# Patient Record
Sex: Female | Born: 1940
Health system: Southern US, Community
[De-identification: ages and names within clinical notes are randomized; demographics above are authoritative.]

## PROBLEM LIST (undated history)

## (undated) DIAGNOSIS — Z973 Presence of spectacles and contact lenses: Secondary | ICD-10-CM

## (undated) DIAGNOSIS — Z860100 Personal history of colon polyps, unspecified: Secondary | ICD-10-CM

## (undated) DIAGNOSIS — Z9889 Other specified postprocedural states: Secondary | ICD-10-CM

## (undated) DIAGNOSIS — Z86718 Personal history of other venous thrombosis and embolism: Secondary | ICD-10-CM

## (undated) DIAGNOSIS — Z85828 Personal history of other malignant neoplasm of skin: Secondary | ICD-10-CM

## (undated) DIAGNOSIS — B1910 Unspecified viral hepatitis B without hepatic coma: Secondary | ICD-10-CM

## (undated) DIAGNOSIS — Q245 Malformation of coronary vessels: Secondary | ICD-10-CM

## (undated) DIAGNOSIS — M419 Scoliosis, unspecified: Secondary | ICD-10-CM

## (undated) DIAGNOSIS — N183 Chronic kidney disease, stage 3 unspecified: Secondary | ICD-10-CM

## (undated) DIAGNOSIS — N393 Stress incontinence (female) (male): Secondary | ICD-10-CM

## (undated) DIAGNOSIS — I10 Essential (primary) hypertension: Secondary | ICD-10-CM

## (undated) DIAGNOSIS — C50212 Malignant neoplasm of upper-inner quadrant of left female breast: Secondary | ICD-10-CM

## (undated) DIAGNOSIS — G4700 Insomnia, unspecified: Secondary | ICD-10-CM

## (undated) DIAGNOSIS — K5790 Diverticulosis of intestine, part unspecified, without perforation or abscess without bleeding: Secondary | ICD-10-CM

## (undated) DIAGNOSIS — Z923 Personal history of irradiation: Secondary | ICD-10-CM

## (undated) DIAGNOSIS — C3490 Malignant neoplasm of unspecified part of unspecified bronchus or lung: Secondary | ICD-10-CM

## (undated) DIAGNOSIS — M1990 Unspecified osteoarthritis, unspecified site: Secondary | ICD-10-CM

## (undated) DIAGNOSIS — E785 Hyperlipidemia, unspecified: Secondary | ICD-10-CM

## (undated) DIAGNOSIS — M8580 Other specified disorders of bone density and structure, unspecified site: Secondary | ICD-10-CM

## (undated) DIAGNOSIS — Z859 Personal history of malignant neoplasm, unspecified: Secondary | ICD-10-CM

## (undated) DIAGNOSIS — F419 Anxiety disorder, unspecified: Secondary | ICD-10-CM

## (undated) DIAGNOSIS — Z170 Estrogen receptor positive status [ER+]: Secondary | ICD-10-CM

## (undated) DIAGNOSIS — R112 Nausea with vomiting, unspecified: Secondary | ICD-10-CM

## (undated) DIAGNOSIS — I493 Ventricular premature depolarization: Secondary | ICD-10-CM

## (undated) HISTORY — PX: BASAL CELL CARCINOMA EXCISION: SHX1214

## (undated) HISTORY — PX: VIDEO ASSISTED THORACOSCOPY (VATS)/WEDGE RESECTION: SHX6174

## (undated) HISTORY — PX: THYMECTOMY: SHX1063

## (undated) HISTORY — DX: Essential (primary) hypertension: I10

## (undated) HISTORY — PX: COLONOSCOPY: SHX174

## (undated) HISTORY — PX: BREAST EXCISIONAL BIOPSY: SUR124

## (undated) HISTORY — PX: BREAST BIOPSY: SHX20

## (undated) HISTORY — PX: CHOLECYSTECTOMY OPEN: SUR202

## (undated) HISTORY — PX: TONSILLECTOMY AND ADENOIDECTOMY: SUR1326

## (undated) HISTORY — DX: Anxiety disorder, unspecified: F41.9

## (undated) HISTORY — PX: DILATION AND CURETTAGE OF UTERUS: SHX78

## (undated) HISTORY — PX: CARDIAC CATHETERIZATION: SHX172

## (undated) HISTORY — PX: SQUAMOUS CELL CARCINOMA EXCISION: SHX2433

## (undated) HISTORY — DX: Other specified disorders of bone density and structure, unspecified site: M85.80

---

## 1968-12-21 DIAGNOSIS — B1910 Unspecified viral hepatitis B without hepatic coma: Secondary | ICD-10-CM

## 1968-12-21 HISTORY — DX: Unspecified viral hepatitis B without hepatic coma: B19.10

## 1972-04-22 HISTORY — PX: VAGINAL HYSTERECTOMY: SUR661

## 1998-03-03 ENCOUNTER — Other Ambulatory Visit: Admission: RE | Admit: 1998-03-03 | Discharge: 1998-03-03 | Payer: Self-pay | Admitting: *Deleted

## 1999-07-24 ENCOUNTER — Encounter: Admission: RE | Admit: 1999-07-24 | Discharge: 1999-07-24 | Payer: Self-pay | Admitting: *Deleted

## 1999-07-24 ENCOUNTER — Encounter: Payer: Self-pay | Admitting: *Deleted

## 2000-04-23 ENCOUNTER — Other Ambulatory Visit: Admission: RE | Admit: 2000-04-23 | Discharge: 2000-04-23 | Payer: Self-pay | Admitting: *Deleted

## 2000-07-24 ENCOUNTER — Encounter: Admission: RE | Admit: 2000-07-24 | Discharge: 2000-07-24 | Payer: Self-pay | Admitting: *Deleted

## 2000-07-24 ENCOUNTER — Encounter: Payer: Self-pay | Admitting: *Deleted

## 2001-04-28 ENCOUNTER — Other Ambulatory Visit: Admission: RE | Admit: 2001-04-28 | Discharge: 2001-04-28 | Payer: Self-pay | Admitting: *Deleted

## 2001-07-30 ENCOUNTER — Encounter: Admission: RE | Admit: 2001-07-30 | Discharge: 2001-07-30 | Payer: Self-pay | Admitting: Obstetrics and Gynecology

## 2001-07-30 ENCOUNTER — Encounter: Payer: Self-pay | Admitting: Obstetrics and Gynecology

## 2001-09-01 ENCOUNTER — Ambulatory Visit (HOSPITAL_COMMUNITY): Admission: RE | Admit: 2001-09-01 | Discharge: 2001-09-01 | Payer: Self-pay | Admitting: Internal Medicine

## 2001-12-04 ENCOUNTER — Encounter: Payer: Self-pay | Admitting: Otolaryngology

## 2001-12-04 ENCOUNTER — Encounter: Admission: RE | Admit: 2001-12-04 | Discharge: 2001-12-04 | Payer: Self-pay | Admitting: Otolaryngology

## 2002-05-24 ENCOUNTER — Other Ambulatory Visit: Admission: RE | Admit: 2002-05-24 | Discharge: 2002-05-24 | Payer: Self-pay | Admitting: *Deleted

## 2002-08-03 ENCOUNTER — Encounter: Payer: Self-pay | Admitting: *Deleted

## 2002-08-03 ENCOUNTER — Encounter: Admission: RE | Admit: 2002-08-03 | Discharge: 2002-08-03 | Payer: Self-pay | Admitting: *Deleted

## 2003-05-12 ENCOUNTER — Emergency Department (HOSPITAL_COMMUNITY): Admission: AD | Admit: 2003-05-12 | Discharge: 2003-05-12 | Payer: Self-pay | Admitting: Family Medicine

## 2003-05-17 ENCOUNTER — Other Ambulatory Visit: Admission: RE | Admit: 2003-05-17 | Discharge: 2003-05-17 | Payer: Self-pay | Admitting: Obstetrics and Gynecology

## 2003-05-18 ENCOUNTER — Emergency Department (HOSPITAL_COMMUNITY): Admission: EM | Admit: 2003-05-18 | Discharge: 2003-05-18 | Payer: Self-pay | Admitting: Family Medicine

## 2003-08-12 ENCOUNTER — Encounter: Admission: RE | Admit: 2003-08-12 | Discharge: 2003-08-12 | Payer: Self-pay | Admitting: Obstetrics and Gynecology

## 2004-05-17 ENCOUNTER — Other Ambulatory Visit: Admission: RE | Admit: 2004-05-17 | Discharge: 2004-05-17 | Payer: Self-pay | Admitting: Obstetrics and Gynecology

## 2004-09-06 ENCOUNTER — Encounter: Admission: RE | Admit: 2004-09-06 | Discharge: 2004-09-06 | Payer: Self-pay | Admitting: Obstetrics and Gynecology

## 2005-09-10 ENCOUNTER — Encounter: Admission: RE | Admit: 2005-09-10 | Discharge: 2005-09-10 | Payer: Self-pay | Admitting: Obstetrics and Gynecology

## 2005-09-17 ENCOUNTER — Other Ambulatory Visit: Admission: RE | Admit: 2005-09-17 | Discharge: 2005-09-17 | Payer: Self-pay | Admitting: Obstetrics and Gynecology

## 2005-10-01 ENCOUNTER — Encounter: Admission: RE | Admit: 2005-10-01 | Discharge: 2005-10-01 | Payer: Self-pay | Admitting: Obstetrics and Gynecology

## 2006-09-18 ENCOUNTER — Encounter: Admission: RE | Admit: 2006-09-18 | Discharge: 2006-09-18 | Payer: Self-pay | Admitting: Obstetrics and Gynecology

## 2006-09-19 ENCOUNTER — Other Ambulatory Visit: Admission: RE | Admit: 2006-09-19 | Discharge: 2006-09-19 | Payer: Self-pay | Admitting: Obstetrics and Gynecology

## 2007-04-23 DIAGNOSIS — C3490 Malignant neoplasm of unspecified part of unspecified bronchus or lung: Secondary | ICD-10-CM

## 2007-04-23 HISTORY — PX: LUNG BIOPSY: SHX232

## 2007-04-23 HISTORY — DX: Malignant neoplasm of unspecified part of unspecified bronchus or lung: C34.90

## 2007-06-18 ENCOUNTER — Ambulatory Visit (HOSPITAL_COMMUNITY): Admission: RE | Admit: 2007-06-18 | Discharge: 2007-06-18 | Payer: Self-pay | Admitting: Otolaryngology

## 2007-07-07 ENCOUNTER — Ambulatory Visit: Payer: Self-pay | Admitting: Thoracic Surgery

## 2007-07-07 ENCOUNTER — Encounter: Admission: RE | Admit: 2007-07-07 | Discharge: 2007-07-07 | Payer: Self-pay | Admitting: Otolaryngology

## 2007-07-20 ENCOUNTER — Ambulatory Visit (HOSPITAL_COMMUNITY): Admission: RE | Admit: 2007-07-20 | Discharge: 2007-07-20 | Payer: Self-pay | Admitting: Thoracic Surgery

## 2007-07-20 ENCOUNTER — Encounter (INDEPENDENT_AMBULATORY_CARE_PROVIDER_SITE_OTHER): Payer: Self-pay | Admitting: Diagnostic Radiology

## 2007-07-22 ENCOUNTER — Ambulatory Visit: Payer: Self-pay | Admitting: Thoracic Surgery

## 2007-09-21 ENCOUNTER — Encounter: Admission: RE | Admit: 2007-09-21 | Discharge: 2007-09-21 | Payer: Self-pay | Admitting: Obstetrics and Gynecology

## 2007-09-30 ENCOUNTER — Ambulatory Visit: Payer: Self-pay | Admitting: Thoracic Surgery

## 2007-09-30 ENCOUNTER — Encounter: Admission: RE | Admit: 2007-09-30 | Discharge: 2007-09-30 | Payer: Self-pay | Admitting: Thoracic Surgery

## 2007-10-19 ENCOUNTER — Encounter: Payer: Self-pay | Admitting: Thoracic Surgery

## 2007-10-19 ENCOUNTER — Ambulatory Visit: Payer: Self-pay | Admitting: Thoracic Surgery

## 2007-10-19 ENCOUNTER — Inpatient Hospital Stay (HOSPITAL_COMMUNITY): Admission: RE | Admit: 2007-10-19 | Discharge: 2007-10-24 | Payer: Self-pay | Admitting: Thoracic Surgery

## 2007-10-29 ENCOUNTER — Encounter: Admission: RE | Admit: 2007-10-29 | Discharge: 2007-10-29 | Payer: Self-pay | Admitting: Thoracic Surgery

## 2007-10-29 ENCOUNTER — Ambulatory Visit: Payer: Self-pay | Admitting: Thoracic Surgery

## 2007-11-19 ENCOUNTER — Encounter: Admission: RE | Admit: 2007-11-19 | Discharge: 2007-11-19 | Payer: Self-pay | Admitting: Cardiothoracic Surgery

## 2007-11-19 ENCOUNTER — Ambulatory Visit: Payer: Self-pay | Admitting: Thoracic Surgery

## 2007-12-09 ENCOUNTER — Encounter: Admission: RE | Admit: 2007-12-09 | Discharge: 2007-12-09 | Payer: Self-pay | Admitting: Thoracic Surgery

## 2007-12-09 ENCOUNTER — Ambulatory Visit: Payer: Self-pay | Admitting: Thoracic Surgery

## 2008-04-05 ENCOUNTER — Encounter: Admission: RE | Admit: 2008-04-05 | Discharge: 2008-04-05 | Payer: Self-pay | Admitting: Thoracic Surgery

## 2008-04-05 ENCOUNTER — Ambulatory Visit: Payer: Self-pay | Admitting: Thoracic Surgery

## 2008-08-09 ENCOUNTER — Encounter: Admission: RE | Admit: 2008-08-09 | Discharge: 2008-08-09 | Payer: Self-pay | Admitting: Thoracic Surgery

## 2008-08-09 ENCOUNTER — Ambulatory Visit: Payer: Self-pay | Admitting: Thoracic Surgery

## 2008-09-22 ENCOUNTER — Encounter: Admission: RE | Admit: 2008-09-22 | Discharge: 2008-09-22 | Payer: Self-pay | Admitting: Obstetrics and Gynecology

## 2008-09-27 ENCOUNTER — Encounter: Admission: RE | Admit: 2008-09-27 | Discharge: 2008-09-27 | Payer: Self-pay | Admitting: Obstetrics and Gynecology

## 2008-09-29 ENCOUNTER — Ambulatory Visit: Payer: Self-pay | Admitting: Obstetrics and Gynecology

## 2008-09-29 ENCOUNTER — Other Ambulatory Visit: Admission: RE | Admit: 2008-09-29 | Discharge: 2008-09-29 | Payer: Self-pay | Admitting: Obstetrics and Gynecology

## 2008-09-29 ENCOUNTER — Encounter: Payer: Self-pay | Admitting: Obstetrics and Gynecology

## 2008-10-04 ENCOUNTER — Encounter: Admission: RE | Admit: 2008-10-04 | Discharge: 2008-10-04 | Payer: Self-pay | Admitting: Obstetrics and Gynecology

## 2009-01-04 ENCOUNTER — Encounter (INDEPENDENT_AMBULATORY_CARE_PROVIDER_SITE_OTHER): Payer: Self-pay | Admitting: *Deleted

## 2009-02-14 ENCOUNTER — Encounter: Admission: RE | Admit: 2009-02-14 | Discharge: 2009-02-14 | Payer: Self-pay | Admitting: Thoracic Surgery

## 2009-02-14 ENCOUNTER — Ambulatory Visit: Payer: Self-pay | Admitting: Thoracic Surgery

## 2009-02-20 ENCOUNTER — Ambulatory Visit: Payer: Self-pay | Admitting: Gastroenterology

## 2009-03-06 ENCOUNTER — Telehealth: Payer: Self-pay | Admitting: Gastroenterology

## 2009-03-07 ENCOUNTER — Ambulatory Visit: Payer: Self-pay | Admitting: Gastroenterology

## 2009-05-12 ENCOUNTER — Encounter: Admission: RE | Admit: 2009-05-12 | Discharge: 2009-05-12 | Payer: Self-pay | Admitting: Otolaryngology

## 2009-08-18 ENCOUNTER — Encounter: Admission: RE | Admit: 2009-08-18 | Discharge: 2009-08-18 | Payer: Self-pay | Admitting: Thoracic Surgery

## 2009-08-18 ENCOUNTER — Ambulatory Visit: Payer: Self-pay | Admitting: Thoracic Surgery

## 2009-09-25 ENCOUNTER — Encounter: Admission: RE | Admit: 2009-09-25 | Discharge: 2009-09-25 | Payer: Self-pay | Admitting: Obstetrics and Gynecology

## 2009-10-05 ENCOUNTER — Other Ambulatory Visit: Admission: RE | Admit: 2009-10-05 | Discharge: 2009-10-05 | Payer: Self-pay | Admitting: Obstetrics and Gynecology

## 2009-10-05 ENCOUNTER — Ambulatory Visit: Payer: Self-pay | Admitting: Obstetrics and Gynecology

## 2009-11-10 ENCOUNTER — Ambulatory Visit: Payer: Self-pay | Admitting: Obstetrics and Gynecology

## 2010-02-20 ENCOUNTER — Ambulatory Visit (HOSPITAL_COMMUNITY): Admission: RE | Admit: 2010-02-20 | Discharge: 2010-02-20 | Payer: Self-pay | Admitting: Internal Medicine

## 2010-03-26 ENCOUNTER — Ambulatory Visit (HOSPITAL_COMMUNITY)
Admission: RE | Admit: 2010-03-26 | Discharge: 2010-03-26 | Payer: Self-pay | Source: Home / Self Care | Admitting: Cardiology

## 2010-05-13 ENCOUNTER — Encounter: Payer: Self-pay | Admitting: Obstetrics and Gynecology

## 2010-07-05 ENCOUNTER — Other Ambulatory Visit: Payer: Self-pay | Admitting: Thoracic Surgery

## 2010-07-05 DIAGNOSIS — C3410 Malignant neoplasm of upper lobe, unspecified bronchus or lung: Secondary | ICD-10-CM

## 2010-08-14 ENCOUNTER — Ambulatory Visit (INDEPENDENT_AMBULATORY_CARE_PROVIDER_SITE_OTHER): Payer: 59 | Admitting: Thoracic Surgery

## 2010-08-14 ENCOUNTER — Ambulatory Visit
Admission: RE | Admit: 2010-08-14 | Discharge: 2010-08-14 | Disposition: A | Discharge status: Home / Self Care | Payer: 59 | Source: Ambulatory Visit | Attending: Thoracic Surgery | Admitting: Thoracic Surgery

## 2010-08-14 DIAGNOSIS — C3410 Malignant neoplasm of upper lobe, unspecified bronchus or lung: Secondary | ICD-10-CM

## 2010-08-14 DIAGNOSIS — C3490 Malignant neoplasm of unspecified part of unspecified bronchus or lung: Secondary | ICD-10-CM

## 2010-08-15 NOTE — Assessment & Plan Note (Signed)
OFFICE VISIT  MIOSHA, BEHE DOB:  [birth year]                                        August 14, 2010 CHART #:  [id number]  Ms. Yehle came for followup today.  She is 3 years since we have resected her neuroendocrine tumor in which she also had some caseating granulomas that are consistent with sarcoid.  Her CT scan is stable. There is no evidence of recurrence.  I will refer her back to Dr. Jarold Motto for long-term followup.  I would suggest that she probably should have a chest x-ray at least once a year.  Her blood pressure is 144/70, pulse 64, respirations 20, sats are 98%.  Ines Bloomer, M.D. Electronically Signed  DPB/MEDQ  D:  08/14/2010  T:  08/15/2010  Job:  [id number]  cc:   Barry Dienes. Eloise Harman, M.D.

## 2010-08-22 ENCOUNTER — Other Ambulatory Visit: Payer: Self-pay | Admitting: Obstetrics and Gynecology

## 2010-08-22 DIAGNOSIS — Z1231 Encounter for screening mammogram for malignant neoplasm of breast: Secondary | ICD-10-CM

## 2010-09-04 NOTE — Letter (Signed)
October 29, 2007   Lucky Cowboy, MD  [address]  [place], [place] [address]   Re:  Teresa Chapman, WHITEHAIR                  DOB:  [birth year]   Dear Leslye Peer:   I saw the patient back after we had done really a posterior  segmentectomy on her with node sampling.  She had 2 processes in this  lesion on posterior segment.  One was a granulomatous process, which we  did not see any active organisms on stains, but she also had a 1.7-cm  low-grade neuroendocrine cancer or carcinoid, and all nodes were  negative, so other than just observation, I felt it needs to be done.  Her chest x-ray today showed normal postoperative changes.  Her blood  pressure is 115/67, pulse 80, respirations 18, and sats were 97%.  I  told her she could return to driving in a week.  I will see her back  again in 3 weeks with a chest x-ray.   Ines Bloomer, M.D.  Electronically Signed   DPB/MEDQ  D:  10/29/2007  T:  10/30/2007  Job:  [id number]   cc:   Vania Rea. Jarold Motto, MD, Caleen Essex, FAGA

## 2010-09-04 NOTE — Op Note (Signed)
Teresa, Chapman                  ACCOUNT NO.:  [id number]   MEDICAL RECORD NO.:  [id number]          PATIENT TYPE:  INP   LOCATION:  2304                         FACILITY:  [facility]   PHYSICIAN:  Ines Bloomer, M.D. DATE OF BIRTH:  Sep 18, 1940   DATE OF PROCEDURE:  10/19/2007  DATE OF DISCHARGE:                               OPERATIVE REPORT   PREOPERATIVE DIAGNOSIS:  Enlarging right upper lobe posterior segmental  lesion, questionable granulomatous process possible cancer.   POSTOPERATIVE DIAGNOSIS:  Granulomatous process, right upper lobe  posterior segment.   OPERATION PERFORMED:  Right video-assisted thoracoscopic surgery with  minithoracotomy wedge right upper lobe posterior segment.   SURGEON:  Ines Bloomer, MD   FIRST ASSISTANT:  Coral Ceo, PA-C.   ANESTHESIA:  General anesthesia.   PROCEDURE:  After percutaneous insertion of all monitoring lines, the  patient underwent general anesthesia, was turned to the left lateral  thoracotomy position, and was prepped and draped in the usual sterile  manner.  Two trocar sites were made in the anterior and posterior  axillary lines at seventh intercostal space and a 0-degree scope was  inserted.  The lesions were seen was seen in the posterior segment of  the right upper lobe.  We could see hyperemia in this particular area.  A 3-4 cm incision was made over the triangle of auscultation.  Latissimus was partially divided.  The serratus was reflected  anteriorly.  The fifth intercostal space was entered.  The lesion was  palpated and appeared to be involved in the posterior segment.  We then  divided the superior portion of the major fissure with one half  application of the Auto Suture 60 stapler and two applications of the 30  stapler, but it tolerated to get to resection.  We had to partially  divide the minor fissure and this was divided also with the Auto Suture  30 stapler.  Then, the right upper lobe posterior segment  was resected  with several applications of the Auto Suture 60 stapler.  The lesion was  opened and culture and then sent for frozen section, which revealed a  granulomatous process.  CoSeal was applied to the staple lines.  A  single On-Q was inserted in the usual fashion.  Two chest tubes were  brought through the trocar sites and tied in place with 0 silk.  The  chest was closed with two pericostal drilling through the sixth rib and  passed around the fifth rib, #1 Vicryl in the muscle layer, 2-0 Vicryl  subcutaneous tissue, and Dermabond for skin.  The patient was turned to  the recovery room in stable condition.      Ines Bloomer, M.D.  Electronically Signed     DPB/MEDQ  D:  10/19/2007  T:  10/19/2007  Job:  [id number]

## 2010-09-04 NOTE — Letter (Signed)
July 22, 2007   Lucky Cowboy, MD  [address] [place], [place] [address]   Re:  Teresa Chapman, TRIMMER                  DOB:  August 01, 1940   Dear Leslye Peer,   I saw [name] back after a needle biopsy.  She developed a small  pneumothorax which resolved.  They biopsied both the lateral and the  medial lesion, and both of them showed the same, which was histiocytic  aggregates, which goes along with possible granulomatous process but no  evidence of cancer was seen in either one of these.  Given this finding,  I think we can safely follow this, and I plan to see her again in two  months with a CT scan.  Hopefully, this will resolve over a period of  time, but if it continues to increase in size, then we will have to  revisit the recommendation for a possible operative therapy.   Her blood pressure was 157/77, pulse 74, respirations 18, sats 94%.  Lungs are clear to auscultation and percussion.   Teresa Chapman, M.D.  Electronically Signed   DPB/MEDQ  D:  07/22/2007  T:  07/22/2007  Job:  [id number]

## 2010-09-04 NOTE — Letter (Signed)
February 14, 2009   Barry Dienes. Eloise Harman, MD  [address]  [place], [place] [address]   Re:  Teresa Chapman, LAHMANN                  DOB:  December 01, 1940   Dear Dr. Eloise Harman,   I saw the patient today, and her 67-month chest x-ray showed no evidence  of recurrence of her cancer.  She is now over a year and a half since we  removed a small low-grade neuroendocrine tumor.  We will see her back  again in 6 months with a CT scan.  I appreciate the opportunity of  seeing the patient.   Sincerely,   Ines Bloomer, M.D.  Electronically Signed   DPB/MEDQ  D:  02/14/2009  T:  02/15/2009  Job:  [id number]

## 2010-09-04 NOTE — Letter (Signed)
November 19, 2007   Barry Dienes. Eloise Harman, MD  [address]  [place], [place] [address]   Re:  Teresa Chapman, KLEE                  DOB:  06/13/1940   Dear Dr. Eloise Harman:   I saw the patient in the office today.  She is still having some  moderate chest pain.  Her incisions are well healed.  She is going to  increase her activity, and still has some reaction where we did her  wedge resection and a slight pleural effusion, but no pneumothorax.  I  told her to gradually increase her activities.  We will see her back  again in 3 weeks with a chest x-ray.  Her blood pressure was 137/81,  pulse 72, respirations 18, sats were 97%.   Sincerely,   Ines Bloomer, M.D.  Electronically Signed   DPB/MEDQ  D:  11/19/2007  T:  11/20/2007  Job:  [id number]

## 2010-09-04 NOTE — Assessment & Plan Note (Signed)
OFFICE VISIT   ARDENA, GANGL  DOB:  November 07, 1940                                        December 09, 2007  CHART #:  [id number]   The patient came today.  She is doing well.  Her incisions are well  healed.  She is 2 months since her surgery.  Chest x-ray is stable,  shows normal postoperative changes.  Her effusion had resolved.  Her  blood pressure is 142/70, pulse 64, respirations 18, and sats were 98%.  I plan to see her back again in 4 months with a chest x-ray and then  since she had a carcinoid, we will repeat her CT scan in 1 year.   Ines Bloomer, M.D.  Electronically Signed   DPB/MEDQ  D:  12/09/2007  T:  12/09/2007  Job:  [id number]

## 2010-09-04 NOTE — Discharge Summary (Signed)
Teresa Chapman, Teresa Chapman                  ACCOUNT NO.:  [id number]   MEDICAL RECORD NO.:  [id number]          PATIENT TYPE:  INP   LOCATION:  2037                         FACILITY:  [facility]   PHYSICIAN:  Ines Bloomer, M.D. DATE OF BIRTH:  16-Jul-1940   DATE OF ADMISSION:  10/19/2007  DATE OF DISCHARGE:  10/24/2007                               DISCHARGE SUMMARY   HISTORY:  The patient is a 70 year old female referred to Dr. Edwyna Shell in  thoracic/surgical consultation.  She was evaluated and found to have a  mass in the right perihilar area of the right upper lobe.  A PET scan  was done and this revealed uptake in 2 lesion of the right upper lobe  with each approximately 1.5-2 cm in size.  Other nodules were noted  bilaterally.  However, there was no increased uptake noted on the PET  scan.  The patient has a history of a previous thymoma resection done in  [place], [place].  A repeat CT scan was obtained in Marilee 2009 and  there was increase in size of the right upper lobe lesion.  Resection  was recommended to obtain permanent diagnosis and rule out carcinoma.  She was admitted at this hospitalization for the procedure.   MEDICATIONS PRIOR TO ADMISSION:  Included,  1. Cardizem CD 300 mg daily.  2. Diovan 80 mg daily.  3. Lipitor 10 mg at bedtime.  4. Maxzide 75/50 one-half tablet daily.  5. Propranolol 20 mg twice daily.  6. Klor-Con M 20 one tablet twice daily.  7. Aspirin 81 mg daily.  8. Caltrate 600 plus D 1 tablet twice daily.  9. Centrum multivitamin 1 daily.  10.Vitamin D 400 units daily.  11.Vagifem 1 tablet vaginally, 3 time weekly.   PAST MEDICAL HISTORY:  Also includes hypercholesterolemia and  hypertension.   Family history, social history, review of symptoms, and physical exam,  please history and physical done at the time of admission.   HOSPITAL COURSE:  The patient was admitted electively and on Ezinne 29,  2009, taken to the operating room, at which time she  underwent a right  video-assisted thoracoscopic surgery with mini thoracotomy and a wedge  resection of the right upper lobe posterior segment.  The patient  tolerated the procedure well and was taken to the postanesthesia care  unit in stable condition.   POSTOPERATIVE HOSPITAL COURSE:  Pathology has revealed a low-grade  neuroendocrine carcinoma (carcinoid tumor) 1.7 cm.  There was  lymphovascular space invasion present.  The nearest parenchymal margin  was negative.  There is also granulomatous inflammation with focal  necrosis and emphysematous changes.  For full report, please see the  dictated pathology report.   Overall, the patient's progress has been quite steady.  All routine  lines, monitors, drainage devices have been discontinued in the standard  fashion.  She has advanced activities using standard protocols.  Incision is healing well without signs of infection.  Chest x-ray showed  steady improvement in postsurgical findings and small apical  pneumothorax.  She has been placed on Diflucan.  Overall, her status  has  felt to be stable for discharge on October 24, 2007 after morning evaluation  by Dr. Edwyna Shell.   DISCHARGE INSTRUCTIONS:  The patient received written instruction in  regard to medication, activity, diet, wound care, and follow up.   FOLLOWUP:  Follow up includes with Dr. Edwyna Shell on Thursday, October 29, 2007  at 3:45.   DISCHARGE MEDICATIONS:  Medication on discharge are same as  preoperatively with the addition of Tylox 1-2 q.6 h p.r.n. for pain and  Diflucan 100 mg 1 q.a.m. for 7 days.   FINAL DIAGNOSIS:  Carcinoid tumor status post resection as described  above.   OTHER DIAGNOSES:  As previously listed, per the history.      Rowe Clack, P.A.-C.      Ines Bloomer, M.D.  Electronically Signed    WEG/MEDQ  D:  10/24/2007  T:  10/24/2007  Job:  [id number]

## 2010-09-04 NOTE — Letter (Signed)
August 09, 2008   Barry Dienes. Eloise Harman, MD  [address]  [place], [place] [address]   Re:  KADAJAH, KJOS                  DOB:  1940/11/06   Dear Dr. Eloise Harman:   I saw the patient in the office today, now 10 months since she has had  her resection of  her carcinoid tumor in her right upper lobe.  Her  blood pressure is 124/67, pulse 56, respirations 18, and saturations  were 97%.  Lungs were clear to auscultation and percussion.  There is no  evidence of recurrence of her cancer.  She is doing well overall.  I  will see her again in 6 months with a chest x-ray.   Ines Bloomer, M.D.  Electronically Signed   DPB/MEDQ  D:  08/09/2008  T:  08/10/2008  Job:  [id number]

## 2010-09-04 NOTE — Assessment & Plan Note (Signed)
OFFICE VISIT   ASTIN, RAPE  DOB:  27-Aug-1940                                        August 18, 2009  CHART #:  [id number]   The patient came for followup.  In 2009, she had a posterior segment low-  grade neuroendocrine tumor of 1.7 that was removed and there was a  lymphovascular space invasion.  She also had a noncaseating granuloma.  We have been following her closely regarding this.  Her CT scan today  showed no evidence of recurrence.  She is now 2 years since her surgery.  Her blood pressure was 152/73, pulse 98, respirations 18, and sats were  95%.  We will see her back again in a year with another CT scan.   Ines Bloomer, M.D.  Electronically Signed   DPB/MEDQ  D:  08/18/2009  T:  08/19/2009  Job:  [id number]   cc:   Barry Dienes. Eloise Harman, M.D.

## 2010-09-04 NOTE — Letter (Signed)
July 07, 2007   Lucky Cowboy, MD  [address] [place],  [place] [address]   Re:  Teresa Chapman, ZUK                  DOB:  24-Nov-1940   Dear Leslye Peer:   I appreciate the opportunity to see Ms. Faria.  She is a 70 year old  Caucasian female who has had a complicated history.  She was referred  for evaluation of swelling in the right inferior parotid gland.  She was  placed on doxycycline and Augmentin.  She had a CT scan done that showed  a low density mass in the inferior portion of the right parotid gland  but also a mass in the inferior pole of the left thyroid.  There also  was a mass in the right parahilar area of the right upper lobe.  A PET  scan was done which showed increased uptake in these two upper lobe  lesions, each approximately 1.5 to 2 cm in size.  Also a CT scan showed  a 6 mm nodule in the left upper lobe and an 8 mm nodule in the right  lower lobe both which did not have uptake in her PET scan.  She has a  past history of having a thymectomy done in [place], [place], for  benign thymic mass.  At that time, it was noted that she had a 1 cm  nodule in the right mid lung field.  Her pulmonary function tests showed  an FVC of 2.12, FEV1 of 1.70.  She has had no fever, chills, excessive  sputum, hemoptysis.   MEDICATIONS:  Include:  1. Cardizem 300 mg a day.  2. Diovan 50 mg a day.  3. Klor-Con 20 mEq a day.  4. Lipitor 10 mg a day.  5. Propranolol 20 mg twice a day.  6. Actimol 35 mg weekly.  7. Caltrate Vitamin D.   CODEINE CAUSES ITCHING.   She has hypercholesterolemia and hypertension.   FAMILY HISTORY:  Negative for vascular disease and cancer.   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  Does not smoke or drink.   REVIEW OF SYSTEMS:  She is 158 pounds.  She is 5 foot 3.  GENERAL:  She is well-developed female.  CARDIAC:  She has had some palpitations.  PULMONARY:  She has no  hemoptysis, bronchitis.  GI:  No nausea, vomiting, constipation, diarrhea.  GU:  No kidney disease.  VASCULAR:  No claudication, DVT, TIAs.  NEUROLOGICAL:  No headaches, blackouts or seizures.  MUSCULOSKELETAL:  No arthritis or joint pain.  NEUROPSYCHIATRIC:  No illness.  ENT:  No change in her eyesight or hearing.  HEMATOLOGICAL:  No problems with bleeding or clotting disorders.   PHYSICAL EXAMINATION:  Well-developed Caucasian female in no acute  distress.  Head, eyes, ears, nose and throat were unremarkable.  Do not  palpate any tenderness in the right carotid area.  Neck:  There is no  supraclavicular adenopathy, no carotid bruits.  Chest:  Clear to  auscultation and percussion.  Heart:  Regular sinus rhythm, no murmurs.  Abdomen:  Soft.  There is no hepatosplenomegaly, pulses 2+.  No clubbing  or edema.  Neurological:  She is oriented x3. Sensory and motor intact.  Cranial nerves were intact.   I discussed the situation, and I think that the best thing to do is to  do a needle biopsy of the right upper lobe lesion.  I feel that there is  a high probability that this is a probable cancer.  The other  possibility is that it could be an inflammatory lesion.  I will discuss  this with his radiologist and with you.   I appreciate the opportunity of seeing Ms. Varin.   Sincerely,   Ines Bloomer, M.D.  Electronically Signed   DPB/MEDQ  D:  07/07/2007  T:  07/08/2007  Job:  [id number]   cc:   Dr. Brunilda Payor

## 2010-09-04 NOTE — Letter (Signed)
Myla 10, 2009   Barry Dienes. Eloise Harman, M.D.  [address]  [place], [place] [address]   Re:  BELANNA, MANRING                  DOB:  Erandi 02, 1942   Dear Jesusita Oka,   I saw Ms. Giannotti back today and repeated her CT scan.  As you know, she  had a PET done in February that had just a low-grade uptake.  We saw her  approximately 2 months ago and had a needle biopsy done which showed an  inflammatory situation with lymphohistiocytic aggregates.  Because of  this, we elected to follow her.  Scan is back today and unfortunately  the CT scan shows increase in the size of this area and becomes much  more dense.  I still think it might be an inflammatory situation, but  with increase in size, I have recommended she have surgical removal.  We  have tentatively scheduled this for [facility] at [facility].  We will  try to use the VATS approach.  We may be able to get by with just a  posterior segmentectomy, but if this is cancer, she will require a full  right upper lobectomy with node dissection.   Appreciate the opportunity of seeing Ms. [place].   Sincerely,   Ines Bloomer, M.D.  Electronically Signed   DPB/MEDQ  D:  09/30/2007  T:  10/01/2007  Job:  [id number]   cc:   Barry Dienes. Eloise Harman, M.D.

## 2010-09-04 NOTE — Assessment & Plan Note (Signed)
OFFICE VISIT   Teresa Chapman, Teresa Chapman  DOB:  04-17-1941                                        April 05, 2008  CHART #:  [id number]   The patient came for followup.  She is 5-1/2 month since her resection  for carcinoid tumor.  She is doing well overall.  There is no evidence  of recurrence.  Her lungs are clear to auscultation and percussion.  We  will plan to get her see her again in 4 months with a CT scan.  Her  blood pressure is 121/71, pulse 72, respirations 18, and sats were 98%.   Ines Bloomer, M.D.  Electronically Signed   DPB/MEDQ  D:  04/05/2008  T:  04/05/2008  Job:  [id number]

## 2010-09-04 NOTE — H&P (Signed)
NAMEMALAYSHIA, ALL                  ACCOUNT NO.:  [id number]   MEDICAL RECORD NO.:  [id number]           PATIENT TYPE:   LOCATION:                                 FACILITY:   PHYSICIAN:  Ines Bloomer, M.D. DATE OF BIRTH:  [birth year]   DATE OF ADMISSION:  DATE OF DISCHARGE:                              HISTORY & PHYSICAL   CHIEF COMPLAINT:  Left upper lobe nodules.   This 70 year old patient had a previous thymectomy done in [place].  She was thought to have a 1-cm lesion in the right midlung  field.  Her pulmonary function tests showed an FVC of 2.12, FEV-1 of  1.70.  She developed some swelling in her right inferior parotid gland  and was placed on doxycycline and Augmentin.  CT scan showed a low  density mass in the right parotid and also mass in the inferior pole of  the left thyroid.  There was also felt to be a mass in the right  perihilar area of the right upper lobe.  A PET scan was done that showed  increased uptake in actually 2 lesions in the right upper, so each  approximately 1.5 to 2 cm in size.  There is also 6 mm lesion in the  left upper lobe and 8 mm lesion in the right lower lobe that had no  uptake on the PET scan.  She had no fever, chills, excessive sputum.  She does not smoke.  She had an aspiration of the right upper lobe  lesion that showed lymphohistiocytic aggregates that were present versus  obstructive pneumonitis or granulomatous inflammation and it was done in  March and was decided to follow her a while.  She came back in [place]  2009, and the repeat CT scan showed that the area had increased in size  and was more dense.  Because of its increased size, it was recommended  that she have a resection of this for both permanent diagnosis and rule  out any cancer.  If it is cancer, we would do a complete right upper  lobectomy with node dissection.   MEDICATIONS:  1. Cardizem 300 mg a day.  2. Diovan 50 mg a day.  3. Klor-Con 20 mg a day.  4. Lipitor 10 mg a day.  5. Propranolol 20 mg twice a day.  6. Actonel 35 mg a day.  7. Caltrate.   She has hypercholesterolemia and hypertension.   Family history is negative for vascular disease and cancer.   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  She is married,  has 2 children.  Does not smoke or drink.   REVIEW OF SYSTEMS:  She is 158 pounds.  GENERAL:  She is a well-  developed white female in no acute distress.  CARDIAC:  She has had some  palpitations, no angina.  PULMONARY:  No hemoptysis or bronchitis.  GI:  No nausea, vomiting, constipation, or diarrhea.  GU:  No kidney disease,  dysuria, or frequent urination.  VASCULAR:  No claudication, DVT, TIAs.  NEUROLOGICAL:  No dizziness, headaches, blackouts, or seizures.  MUSCULOSKELETAL:  No arthritis or joint pain.  PSYCHIATRIC:  No  depression or nervousness.  HEENT:  No change in her eyesight or  hearing.  HEMOLOGY:  No problems with bleeding, clotting disorders, or  anemia.   PHYSICAL EXAMINATION:  GENERAL:  She is a well-developed Caucasian  female in no acute distress.  VITAL SIGNS:  Her blood pressure is 157/77, pulse 74, respirations 18,  sats were 94%.  HEAD:  Atraumatic.  EYES:  Pupils equally reactive to light and accommodation.  Extraocular  movements normal.  EARS:  Tympanic membranes are intact.  NOSE:  There is no septal deviation.  THROAT:  Without lesion.  EXAMINATION OF THE PAROTID:  There is no tenderness or palpable mass.  NECK:  No palpable thyroid masses.  No supraclavicular or axillary  adenopathy.  CHEST:  Clear to auscultation and percussion.  HEART:  Regular sinus rhythm.  No murmurs.  There is a mediastinotomy  incision.  ABDOMEN:  Soft.  There is no hepatosplenomegaly.  Pulses are 2+.  There  is no clubbing or edema.  NEUROLOGICAL:  She is oriented x3.  Sensory and motor are intact.  Cranial nerves intact.   IMPRESSION:  1. Enlarging right upper lobe lesion, rule out  cancer, rule out      granulomatous process.  2. History of thymectomy for thymoma.  3. Hypertension.  4. Hypercholesterolemia.  5. History of parotitis.   PLAN:  Right vats wedge of posterior segment, right upper lobe and  possible right upper lobectomy.      Ines Bloomer, M.D.  Electronically Signed     DPB/MEDQ  D:  10/15/2007  T:  10/16/2007  Job:  [id number]

## 2010-09-27 ENCOUNTER — Ambulatory Visit
Admission: RE | Admit: 2010-09-27 | Discharge: 2010-09-27 | Disposition: A | Discharge status: Home / Self Care | Payer: 59 | Source: Ambulatory Visit | Attending: Obstetrics and Gynecology | Admitting: Obstetrics and Gynecology

## 2010-09-27 DIAGNOSIS — Z1231 Encounter for screening mammogram for malignant neoplasm of breast: Secondary | ICD-10-CM

## 2010-10-08 ENCOUNTER — Other Ambulatory Visit: Payer: Self-pay | Admitting: Obstetrics and Gynecology

## 2010-10-08 ENCOUNTER — Encounter (INDEPENDENT_AMBULATORY_CARE_PROVIDER_SITE_OTHER): Payer: 59 | Admitting: Obstetrics and Gynecology

## 2010-10-08 ENCOUNTER — Other Ambulatory Visit (HOSPITAL_COMMUNITY)
Admission: RE | Admit: 2010-10-08 | Discharge: 2010-10-08 | Disposition: A | Discharge status: Home / Self Care | Payer: 59 | Source: Ambulatory Visit | Attending: Obstetrics and Gynecology | Admitting: Obstetrics and Gynecology

## 2010-10-08 DIAGNOSIS — Z124 Encounter for screening for malignant neoplasm of cervix: Secondary | ICD-10-CM | POA: Insufficient documentation

## 2010-10-08 DIAGNOSIS — R82998 Other abnormal findings in urine: Secondary | ICD-10-CM

## 2010-10-08 DIAGNOSIS — N951 Menopausal and female climacteric states: Secondary | ICD-10-CM

## 2010-10-08 DIAGNOSIS — M899 Disorder of bone, unspecified: Secondary | ICD-10-CM

## 2010-10-08 DIAGNOSIS — Z780 Asymptomatic menopausal state: Secondary | ICD-10-CM

## 2010-10-08 DIAGNOSIS — N952 Postmenopausal atrophic vaginitis: Secondary | ICD-10-CM

## 2010-10-11 ENCOUNTER — Ambulatory Visit
Admission: RE | Admit: 2010-10-11 | Discharge: 2010-10-11 | Disposition: A | Discharge status: Home / Self Care | Payer: 59 | Source: Ambulatory Visit | Attending: Obstetrics and Gynecology | Admitting: Obstetrics and Gynecology

## 2010-10-11 DIAGNOSIS — Z780 Asymptomatic menopausal state: Secondary | ICD-10-CM

## 2010-11-28 ENCOUNTER — Other Ambulatory Visit: Payer: Self-pay | Admitting: Obstetrics and Gynecology

## 2011-01-14 LAB — CBC
HCT: 41.5
Hemoglobin: 14.4
MCV: 90.1
Platelets: 257
RBC: 4.61
WBC: 6.9

## 2011-01-17 LAB — COMPREHENSIVE METABOLIC PANEL
Albumin: 4.3
Alkaline Phosphatase: 86
BUN: 19
BUN: 9
CO2: 22
CO2: 26
Chloride: 103
Chloride: 104
Creatinine, Ser: 0.82
GFR calc non Af Amer: >60
GFR calc non Af Amer: >60
Glucose, Bld: 117 — ABNORMAL HIGH
Potassium: 3.8
Total Bilirubin: 1
Total Bilirubin: 0.6

## 2011-01-17 LAB — BASIC METABOLIC PANEL
BUN: 12
BUN: 17
CO2: 27
Calcium: 8.5
Chloride: 104
Chloride: 102
Chloride: 98
Creatinine, Ser: 1.03
GFR calc Af Amer: >60
GFR calc Af Amer: >60
GFR calc Af Amer: >60
GFR calc non Af Amer: 53 — ABNORMAL LOW
GFR calc non Af Amer: >60
Glucose, Bld: 114 — ABNORMAL HIGH
Potassium: 3.2 — ABNORMAL LOW
Potassium: 3.4 — ABNORMAL LOW
Potassium: 3.4 — ABNORMAL LOW
Potassium: 3.5
Sodium: 140
Sodium: 135
Sodium: 138

## 2011-01-17 LAB — URINE MICROSCOPIC-ADD ON

## 2011-01-17 LAB — CBC
HCT: 41.2
HCT: 33.5 — ABNORMAL LOW
HCT: 33.6 — ABNORMAL LOW
HCT: 34.9 — ABNORMAL LOW
Hemoglobin: 12.5
Hemoglobin: 14.1
Hemoglobin: 11.6 — ABNORMAL LOW
Hemoglobin: 12.3
MCHC: 35.7
MCV: 89.8
MCV: 90
MCV: 91
Platelets: 258
Platelets: 221
Platelets: 222
Platelets: 283
RBC: 3.88
RBC: 4.59
RBC: 3.69 — ABNORMAL LOW
WBC: 7.1
WBC: 12.3 — ABNORMAL HIGH
WBC: 12.5 — ABNORMAL HIGH
WBC: 14.6 — ABNORMAL HIGH

## 2011-01-17 LAB — TYPE AND SCREEN: ABO/RH(D): O POS

## 2011-01-17 LAB — AFB CULTURE WITH SMEAR (NOT AT ARMC)
Acid Fast Smear: NONE SEEN
Acid Fast Smear: NONE SEEN

## 2011-01-17 LAB — BLOOD GAS, ARTERIAL
Bicarbonate: 24.4 — ABNORMAL HIGH
FIO2: 0.21
Patient temperature: 98.6
TCO2: 25.3
pCO2 arterial: 31 — ABNORMAL LOW
pH, Arterial: 7.507 — ABNORMAL HIGH
pO2, Arterial: 98

## 2011-01-17 LAB — TISSUE CULTURE: Culture: NO GROWTH

## 2011-01-17 LAB — PROTIME-INR: INR: 1

## 2011-01-17 LAB — POCT I-STAT 3, ART BLOOD GAS (G3+)
Bicarbonate: 27.1 — ABNORMAL HIGH
Patient temperature: 98.4
TCO2: 28
pH, Arterial: 7.451 — ABNORMAL HIGH

## 2011-01-17 LAB — FUNGUS CULTURE W SMEAR: Fungal Smear: NONE SEEN

## 2011-01-17 LAB — WOUND CULTURE: Culture: NO GROWTH

## 2011-01-17 LAB — URINALYSIS, ROUTINE W REFLEX MICROSCOPIC
Bilirubin Urine: NEGATIVE
Hgb urine dipstick: NEGATIVE
Ketones, ur: NEGATIVE
Specific Gravity, Urine: 1.011
Urobilinogen, UA: 0.2
pH: 7

## 2011-08-20 ENCOUNTER — Other Ambulatory Visit: Payer: Self-pay | Admitting: Obstetrics and Gynecology

## 2011-08-20 DIAGNOSIS — Z1231 Encounter for screening mammogram for malignant neoplasm of breast: Secondary | ICD-10-CM

## 2011-10-01 ENCOUNTER — Ambulatory Visit
Admission: RE | Admit: 2011-10-01 | Discharge: 2011-10-01 | Disposition: A | Discharge status: Home / Self Care | Payer: 59 | Source: Ambulatory Visit | Attending: Obstetrics and Gynecology | Admitting: Obstetrics and Gynecology

## 2011-10-01 DIAGNOSIS — Z1231 Encounter for screening mammogram for malignant neoplasm of breast: Secondary | ICD-10-CM

## 2011-10-15 ENCOUNTER — Ambulatory Visit (INDEPENDENT_AMBULATORY_CARE_PROVIDER_SITE_OTHER): Payer: 59 | Admitting: Obstetrics and Gynecology

## 2011-10-15 ENCOUNTER — Encounter: Payer: Self-pay | Admitting: Obstetrics and Gynecology

## 2011-10-15 VITALS — BP 130/82 | Ht 61.0 in | Wt 160.0 lb

## 2011-10-15 DIAGNOSIS — R002 Palpitations: Secondary | ICD-10-CM | POA: Insufficient documentation

## 2011-10-15 DIAGNOSIS — I10 Essential (primary) hypertension: Secondary | ICD-10-CM | POA: Insufficient documentation

## 2011-10-15 DIAGNOSIS — C801 Malignant (primary) neoplasm, unspecified: Secondary | ICD-10-CM | POA: Insufficient documentation

## 2011-10-15 DIAGNOSIS — Z01419 Encounter for gynecological examination (general) (routine) without abnormal findings: Secondary | ICD-10-CM

## 2011-10-15 DIAGNOSIS — N952 Postmenopausal atrophic vaginitis: Secondary | ICD-10-CM | POA: Insufficient documentation

## 2011-10-15 DIAGNOSIS — M8580 Other specified disorders of bone density and structure, unspecified site: Secondary | ICD-10-CM | POA: Insufficient documentation

## 2011-10-15 DIAGNOSIS — Q249 Congenital malformation of heart, unspecified: Secondary | ICD-10-CM | POA: Insufficient documentation

## 2011-10-15 NOTE — Progress Notes (Signed)
Patient came back to see me today for an annual GYN exam. She has never had an abnormal Pap smear. She is up-to-date on her mammograms. She was treated with Actonel for bone loss for 5-10 years. She is not sure of the exact time frame. She is now been on drug holiday for several years. She's had no fractures. Her last bone density was last year. It was stable. They calculated a  FRAX risk even though it  is not used for people who have  been treated. It showed an elevated hip risk but I suspect that is due to her mother's history of a hip fracture and her age and her T score of the hip was only -1.3. She has had no vaginal bleeding. She has had no pelvic pain. She is had no recurrence of her early lung cancer. She uses Vagifem for atrophic vaginitis 3 times a week with great  Results.  Physical examination: Teresa Chapman present. HEENT within normal limits. Neck: Thyroid not large. No masses. Supraclavicular nodes: not enlarged. Breasts: Examined in both sitting and lying  position. No skin changes and no masses. Abdomen: Soft no guarding rebound or masses or hernia. Pelvic: External: Within normal limits. BUS: Within normal limits. Vaginal:within normal limits. Good estrogen effect. No evidence of cystocele rectocele or enterocele. Cervix: clean. Uterus: Normal size and shape. Adnexa: No masses. Rectovaginal exam: Confirmatory and negative. Extremities: Within normal limits.  Assessment: Atrophic vaginitis. Osteopenia with greater than 5 years of treatment-on drug holiday.  Plan: Continue Vagifem. Continue yearly mammograms. Bone density next year. No pap  done.

## 2011-10-16 LAB — URINALYSIS W MICROSCOPIC + REFLEX CULTURE
Glucose, UA: NEGATIVE mg/dL
Hgb urine dipstick: NEGATIVE
Leukocytes, UA: NEGATIVE
Nitrite: NEGATIVE
Protein, ur: NEGATIVE mg/dL
Squamous Epithelial / LPF: NONE SEEN
pH: 7.5 (ref 5.0–8.0)

## 2011-11-29 ENCOUNTER — Other Ambulatory Visit: Payer: Self-pay | Admitting: Obstetrics and Gynecology

## 2011-12-24 DIAGNOSIS — D239 Other benign neoplasm of skin, unspecified: Secondary | ICD-10-CM | POA: Diagnosis not present

## 2011-12-24 DIAGNOSIS — Z85828 Personal history of other malignant neoplasm of skin: Secondary | ICD-10-CM | POA: Diagnosis not present

## 2011-12-24 DIAGNOSIS — L821 Other seborrheic keratosis: Secondary | ICD-10-CM | POA: Diagnosis not present

## 2012-01-10 DIAGNOSIS — Z23 Encounter for immunization: Secondary | ICD-10-CM | POA: Diagnosis not present

## 2012-02-07 DIAGNOSIS — R82998 Other abnormal findings in urine: Secondary | ICD-10-CM | POA: Diagnosis not present

## 2012-02-07 DIAGNOSIS — M949 Disorder of cartilage, unspecified: Secondary | ICD-10-CM | POA: Diagnosis not present

## 2012-02-07 DIAGNOSIS — E785 Hyperlipidemia, unspecified: Secondary | ICD-10-CM | POA: Diagnosis not present

## 2012-02-07 DIAGNOSIS — M899 Disorder of bone, unspecified: Secondary | ICD-10-CM | POA: Diagnosis not present

## 2012-02-07 DIAGNOSIS — I10 Essential (primary) hypertension: Secondary | ICD-10-CM | POA: Diagnosis not present

## 2012-02-14 DIAGNOSIS — R222 Localized swelling, mass and lump, trunk: Secondary | ICD-10-CM | POA: Diagnosis not present

## 2012-02-14 DIAGNOSIS — R635 Abnormal weight gain: Secondary | ICD-10-CM | POA: Diagnosis not present

## 2012-02-14 DIAGNOSIS — Z1331 Encounter for screening for depression: Secondary | ICD-10-CM | POA: Diagnosis not present

## 2012-02-14 DIAGNOSIS — I10 Essential (primary) hypertension: Secondary | ICD-10-CM | POA: Diagnosis not present

## 2012-02-14 DIAGNOSIS — M1710 Unilateral primary osteoarthritis, unspecified knee: Secondary | ICD-10-CM | POA: Diagnosis not present

## 2012-02-14 DIAGNOSIS — M899 Disorder of bone, unspecified: Secondary | ICD-10-CM | POA: Diagnosis not present

## 2012-02-14 DIAGNOSIS — M949 Disorder of cartilage, unspecified: Secondary | ICD-10-CM | POA: Diagnosis not present

## 2012-02-14 DIAGNOSIS — Z0000 Encounter for general adult medical examination without abnormal findings: Secondary | ICD-10-CM | POA: Diagnosis not present

## 2012-02-17 DIAGNOSIS — Z1212 Encounter for screening for malignant neoplasm of rectum: Secondary | ICD-10-CM | POA: Diagnosis not present

## 2012-04-22 DIAGNOSIS — Z923 Personal history of irradiation: Secondary | ICD-10-CM

## 2012-04-22 HISTORY — PX: BREAST LUMPECTOMY: SHX2

## 2012-04-22 HISTORY — DX: Personal history of irradiation: Z92.3

## 2012-04-23 DIAGNOSIS — J029 Acute pharyngitis, unspecified: Secondary | ICD-10-CM | POA: Diagnosis not present

## 2012-04-23 DIAGNOSIS — J209 Acute bronchitis, unspecified: Secondary | ICD-10-CM | POA: Diagnosis not present

## 2012-04-23 DIAGNOSIS — R509 Fever, unspecified: Secondary | ICD-10-CM | POA: Diagnosis not present

## 2012-04-23 DIAGNOSIS — R05 Cough: Secondary | ICD-10-CM | POA: Diagnosis not present

## 2012-08-31 ENCOUNTER — Other Ambulatory Visit: Payer: Self-pay

## 2012-08-31 DIAGNOSIS — Z1231 Encounter for screening mammogram for malignant neoplasm of breast: Secondary | ICD-10-CM

## 2012-10-06 ENCOUNTER — Ambulatory Visit
Admission: RE | Admit: 2012-10-06 | Discharge: 2012-10-06 | Disposition: A | Discharge status: Home / Self Care | Payer: Medicare Other | Source: Ambulatory Visit

## 2012-10-06 DIAGNOSIS — Z1231 Encounter for screening mammogram for malignant neoplasm of breast: Secondary | ICD-10-CM

## 2012-10-07 ENCOUNTER — Other Ambulatory Visit: Payer: Self-pay | Admitting: Gynecology

## 2012-10-07 DIAGNOSIS — R928 Other abnormal and inconclusive findings on diagnostic imaging of breast: Secondary | ICD-10-CM

## 2012-10-16 ENCOUNTER — Encounter: Payer: Self-pay | Admitting: Gynecology

## 2012-10-16 ENCOUNTER — Ambulatory Visit (INDEPENDENT_AMBULATORY_CARE_PROVIDER_SITE_OTHER): Payer: Medicare Other | Admitting: Gynecology

## 2012-10-16 VITALS — BP 124/78 | Ht 61.0 in | Wt 156.0 lb

## 2012-10-16 DIAGNOSIS — M899 Disorder of bone, unspecified: Secondary | ICD-10-CM | POA: Diagnosis not present

## 2012-10-16 DIAGNOSIS — M949 Disorder of cartilage, unspecified: Secondary | ICD-10-CM | POA: Diagnosis not present

## 2012-10-16 DIAGNOSIS — N952 Postmenopausal atrophic vaginitis: Secondary | ICD-10-CM | POA: Diagnosis not present

## 2012-10-16 DIAGNOSIS — M8580 Other specified disorders of bone density and structure, unspecified site: Secondary | ICD-10-CM

## 2012-10-16 MED ORDER — ESTRADIOL 10 MCG VA TABS: ORAL_TABLET | VAGINAL | Status: DC

## 2012-10-16 NOTE — Patient Instructions (Signed)
Follow up one year, sooner as needed.

## 2012-10-16 NOTE — Progress Notes (Signed)
Teresa Chapman 28-Jun-1940 [id number]        72 y.o.  [id number] for followup exam.  Former patient of Dr. Eda Paschal. Several issues noted below.  Past medical history,surgical history, medications, allergies, family history and social history were all reviewed and documented in the EPIC chart.  ROS:  Performed and pertinent positives and negatives are included in the history, assessment and plan .  Exam: Kim assistant Filed Vitals:   10/16/12 0859  BP: 124/78  Height: 5\' 1"  (1.549 m)  Weight: 156 lb (70.761 kg)   General appearance  Normal Skin number of seborrheic keratoses over back and anterior chest. Head/Neck normal with no cervical or supraclavicular adenopathy thyroid normal Lungs  clear Cardiac RR, without RMG Abdominal  soft, nontender, without masses, organomegaly or hernia Breasts  examined lying and sitting without masses, retractions, discharge or axillary adenopathy. Pelvic  Ext/BUS/vagina  normal   Adnexa  Without masses or tenderness    Anus and perineum  normal   Rectovaginal  normal sphincter tone without palpated masses or tenderness.    Assessment/Plan:  72 y.o. [id number] female for followup exam.   1. Atrophic vaginitis. Patient using Vagifem 10 mcg 3 times weekly. Having good results wants to continue. I reviewed the use of Vagifem and recommended twice weekly. Issues of absorption also discussed. Patient's comfortable using I refilled her times a year. 2. Osteopenia. DEXA 09/2010 with T score -1.3. History of Actonel and Boniva for 5 years ending 2010. Has been on drug-free holiday since then. Plan repeat DEXA now and I have asked her to schedule this and she agrees to call and followup for this. Increase calcium vitamin D reviewed. 3. Mammography. Just had mammogram and was recalled for additional views scheduled next week. Followup for this. Otherwise annual mammography. SBE monthly reviewed. 4. Pap smear 2012. No Pap smear done today. No history of significant  abnormal Pap smears. Status post vaginal hysterectomy for benign indications of bleeding. Over the age of 83. Discussed current screening guidelines and recommend stop screening and she agrees with this. 5. Colonoscopy 2010. Recommended repeat at 10 year interval. 6. Health maintenance. No lab work done as it is all done through her primary physician's office. Followup one year, sooner as needed.   Dara Lords MD, 11:37 AM 10/16/2012

## 2012-10-19 ENCOUNTER — Telehealth: Payer: Self-pay | Admitting: *Deleted

## 2012-10-19 DIAGNOSIS — M8580 Other specified disorders of bone density and structure, unspecified site: Secondary | ICD-10-CM

## 2012-10-19 NOTE — Telephone Encounter (Signed)
PT CALLED REQUESTING ORDER PLACE FOR BONE DENSITY PER TF OV NOTE ON 10/16/12 ORDER PLACED.

## 2012-10-20 HISTORY — PX: BREAST BIOPSY: SHX20

## 2012-10-21 ENCOUNTER — Other Ambulatory Visit: Payer: Self-pay | Admitting: Gynecology

## 2012-10-21 ENCOUNTER — Ambulatory Visit
Admission: RE | Admit: 2012-10-21 | Discharge: 2012-10-21 | Disposition: A | Discharge status: Home / Self Care | Payer: Medicare Other | Source: Ambulatory Visit | Attending: Gynecology | Admitting: Gynecology

## 2012-10-21 DIAGNOSIS — H2510 Age-related nuclear cataract, unspecified eye: Secondary | ICD-10-CM | POA: Diagnosis not present

## 2012-10-21 DIAGNOSIS — R928 Other abnormal and inconclusive findings on diagnostic imaging of breast: Secondary | ICD-10-CM

## 2012-10-21 DIAGNOSIS — N6009 Solitary cyst of unspecified breast: Secondary | ICD-10-CM | POA: Diagnosis not present

## 2012-10-21 DIAGNOSIS — H35379 Puckering of macula, unspecified eye: Secondary | ICD-10-CM | POA: Diagnosis not present

## 2012-10-21 DIAGNOSIS — N63 Unspecified lump in unspecified breast: Secondary | ICD-10-CM | POA: Diagnosis not present

## 2012-10-21 DIAGNOSIS — H524 Presbyopia: Secondary | ICD-10-CM | POA: Diagnosis not present

## 2012-10-22 DIAGNOSIS — Z6831 Body mass index (BMI) 31.0-31.9, adult: Secondary | ICD-10-CM | POA: Diagnosis not present

## 2012-10-22 DIAGNOSIS — R0609 Other forms of dyspnea: Secondary | ICD-10-CM | POA: Diagnosis not present

## 2012-10-22 DIAGNOSIS — I10 Essential (primary) hypertension: Secondary | ICD-10-CM | POA: Diagnosis not present

## 2012-10-22 DIAGNOSIS — R635 Abnormal weight gain: Secondary | ICD-10-CM | POA: Diagnosis not present

## 2012-10-28 ENCOUNTER — Other Ambulatory Visit: Payer: Self-pay | Admitting: Gynecology

## 2012-10-28 ENCOUNTER — Ambulatory Visit
Admission: RE | Admit: 2012-10-28 | Discharge: 2012-10-28 | Disposition: A | Discharge status: Home / Self Care | Payer: Medicare Other | Source: Ambulatory Visit | Attending: Gynecology | Admitting: Gynecology

## 2012-10-28 DIAGNOSIS — R928 Other abnormal and inconclusive findings on diagnostic imaging of breast: Secondary | ICD-10-CM

## 2012-10-28 DIAGNOSIS — N6019 Diffuse cystic mastopathy of unspecified breast: Secondary | ICD-10-CM | POA: Diagnosis not present

## 2012-10-28 DIAGNOSIS — C50919 Malignant neoplasm of unspecified site of unspecified female breast: Secondary | ICD-10-CM | POA: Diagnosis not present

## 2012-10-28 DIAGNOSIS — N63 Unspecified lump in unspecified breast: Secondary | ICD-10-CM | POA: Diagnosis not present

## 2012-10-28 DIAGNOSIS — N6089 Other benign mammary dysplasias of unspecified breast: Secondary | ICD-10-CM | POA: Diagnosis not present

## 2012-10-28 DIAGNOSIS — N6009 Solitary cyst of unspecified breast: Secondary | ICD-10-CM | POA: Diagnosis not present

## 2012-10-29 ENCOUNTER — Ambulatory Visit
Admission: RE | Admit: 2012-10-29 | Discharge: 2012-10-29 | Disposition: A | Discharge status: Home / Self Care | Payer: Medicare Other | Source: Ambulatory Visit | Attending: Gynecology | Admitting: Gynecology

## 2012-10-29 ENCOUNTER — Other Ambulatory Visit: Payer: Self-pay | Admitting: Gynecology

## 2012-10-29 DIAGNOSIS — N6320 Unspecified lump in the left breast, unspecified quadrant: Secondary | ICD-10-CM

## 2012-10-29 DIAGNOSIS — Z09 Encounter for follow-up examination after completed treatment for conditions other than malignant neoplasm: Secondary | ICD-10-CM | POA: Diagnosis not present

## 2012-10-29 DIAGNOSIS — C50912 Malignant neoplasm of unspecified site of left female breast: Secondary | ICD-10-CM

## 2012-10-29 DIAGNOSIS — N63 Unspecified lump in unspecified breast: Secondary | ICD-10-CM | POA: Diagnosis not present

## 2012-10-30 ENCOUNTER — Ambulatory Visit
Admission: RE | Admit: 2012-10-30 | Discharge: 2012-10-30 | Disposition: A | Discharge status: Home / Self Care | Payer: Medicare Other | Source: Ambulatory Visit | Attending: Gynecology | Admitting: Gynecology

## 2012-10-30 ENCOUNTER — Telehealth: Payer: Self-pay | Admitting: *Deleted

## 2012-10-30 DIAGNOSIS — M8580 Other specified disorders of bone density and structure, unspecified site: Secondary | ICD-10-CM

## 2012-10-30 DIAGNOSIS — C50212 Malignant neoplasm of upper-inner quadrant of left female breast: Secondary | ICD-10-CM

## 2012-10-30 DIAGNOSIS — M899 Disorder of bone, unspecified: Secondary | ICD-10-CM | POA: Diagnosis not present

## 2012-10-30 DIAGNOSIS — C50219 Malignant neoplasm of upper-inner quadrant of unspecified female breast: Secondary | ICD-10-CM | POA: Insufficient documentation

## 2012-10-30 NOTE — Telephone Encounter (Signed)
Confirmed [facility] for 11/04/12 at 0800.  Instructions and contact information given.

## 2012-11-03 ENCOUNTER — Ambulatory Visit
Admission: RE | Admit: 2012-11-03 | Discharge: 2012-11-03 | Disposition: A | Discharge status: Home / Self Care | Payer: Medicare Other | Source: Ambulatory Visit | Attending: Gynecology | Admitting: Gynecology

## 2012-11-03 DIAGNOSIS — C50912 Malignant neoplasm of unspecified site of left female breast: Secondary | ICD-10-CM

## 2012-11-03 MED ORDER — GADOBENATE DIMEGLUMINE 529 MG/ML IV SOLN
14.0000 mL | Freq: Once | INTRAVENOUS | Status: AC | PRN
  Administered 2012-11-03: 14 mL via INTRAVENOUS

## 2012-11-04 ENCOUNTER — Ambulatory Visit: Payer: Medicare Other | Attending: General Surgery | Admitting: Physical Therapy

## 2012-11-04 ENCOUNTER — Ambulatory Visit
Admission: RE | Admit: 2012-11-04 | Discharge: 2012-11-04 | Disposition: A | Discharge status: Home / Self Care | Payer: Medicare Other | Source: Ambulatory Visit | Attending: Radiation Oncology | Admitting: Radiation Oncology

## 2012-11-04 ENCOUNTER — Ambulatory Visit (HOSPITAL_BASED_OUTPATIENT_CLINIC_OR_DEPARTMENT_OTHER): Payer: Medicare Other | Admitting: Oncology

## 2012-11-04 ENCOUNTER — Ambulatory Visit (HOSPITAL_BASED_OUTPATIENT_CLINIC_OR_DEPARTMENT_OTHER): Payer: Medicare Other | Admitting: General Surgery

## 2012-11-04 ENCOUNTER — Encounter (INDEPENDENT_AMBULATORY_CARE_PROVIDER_SITE_OTHER): Payer: Self-pay | Admitting: General Surgery

## 2012-11-04 ENCOUNTER — Ambulatory Visit: Payer: Medicare Other

## 2012-11-04 ENCOUNTER — Other Ambulatory Visit (HOSPITAL_BASED_OUTPATIENT_CLINIC_OR_DEPARTMENT_OTHER): Payer: Medicare Other | Admitting: Lab

## 2012-11-04 ENCOUNTER — Encounter: Payer: Self-pay | Admitting: *Deleted

## 2012-11-04 ENCOUNTER — Telehealth: Payer: Self-pay | Admitting: *Deleted

## 2012-11-04 ENCOUNTER — Encounter: Payer: Self-pay | Admitting: Oncology

## 2012-11-04 VITALS — BP 176/80 | HR 69 | Temp 97.8°F | Resp 18 | Ht 61.0 in | Wt 165.1 lb

## 2012-11-04 DIAGNOSIS — Z01818 Encounter for other preprocedural examination: Secondary | ICD-10-CM | POA: Diagnosis not present

## 2012-11-04 DIAGNOSIS — C50212 Malignant neoplasm of upper-inner quadrant of left female breast: Secondary | ICD-10-CM

## 2012-11-04 DIAGNOSIS — IMO0001 Reserved for inherently not codable concepts without codable children: Secondary | ICD-10-CM | POA: Insufficient documentation

## 2012-11-04 DIAGNOSIS — C50919 Malignant neoplasm of unspecified site of unspecified female breast: Secondary | ICD-10-CM | POA: Insufficient documentation

## 2012-11-04 DIAGNOSIS — Z170 Estrogen receptor positive status [ER+]: Secondary | ICD-10-CM

## 2012-11-04 DIAGNOSIS — C50219 Malignant neoplasm of upper-inner quadrant of unspecified female breast: Secondary | ICD-10-CM

## 2012-11-04 DIAGNOSIS — R293 Abnormal posture: Secondary | ICD-10-CM | POA: Insufficient documentation

## 2012-11-04 DIAGNOSIS — M4000 Postural kyphosis, site unspecified: Secondary | ICD-10-CM | POA: Insufficient documentation

## 2012-11-04 LAB — CBC WITH DIFFERENTIAL/PLATELET
Basophils Absolute: 0 10*3/uL (ref 0.0–0.1)
Eosinophils Absolute: 0.1 10*3/uL (ref 0.0–0.5)
HGB: 13.1 g/dL (ref 11.6–15.9)
MCV: 87.3 fL (ref 79.5–101.0)
MONO#: 0.6 10*3/uL (ref 0.1–0.9)
MONO%: 9.1 % (ref 0.0–14.0)
NEUT#: 3.9 10*3/uL (ref 1.5–6.5)
RDW: 14.1 % (ref 11.2–14.5)
WBC: 7 10*3/uL (ref 3.9–10.3)
lymph#: 2.3 10*3/uL (ref 0.9–3.3)

## 2012-11-04 LAB — COMPREHENSIVE METABOLIC PANEL (CC13)
Albumin: 3.8 g/dL (ref 3.5–5.0)
BUN: 18.8 mg/dL (ref 7.0–26.0)
CO2: 29 mEq/L (ref 22–29)
Calcium: 9.8 mg/dL (ref 8.4–10.4)
Chloride: 101 mEq/L (ref 98–109)
Glucose: 83 mg/dl (ref 70–140)
Potassium: 3.8 mEq/L (ref 3.5–5.1)
Sodium: 140 mEq/L (ref 136–145)
Total Protein: 7.1 g/dL (ref 6.4–8.3)

## 2012-11-04 MED ORDER — ZOLPIDEM TARTRATE 10 MG PO TABS: 10.0000 mg | ORAL_TABLET | Freq: Every evening | ORAL | Status: DC | PRN

## 2012-11-04 MED ORDER — ZOLPIDEM TARTRATE 10 MG PO TABS: 5.0000 mg | ORAL_TABLET | Freq: Every evening | ORAL | Status: DC | PRN

## 2012-11-04 NOTE — Progress Notes (Signed)
Radiation Oncology         [phone]) [phone] ________________________________  Initial outpatient Consultation - Date: 11/04/2012   Name: Teresa Chapman MRN: [id number]   DOB: 1941/01/26  REFERRING PHYSICIAN: Emelia Loron, MD  DIAGNOSIS: T1cN0 Invasive Left breast cancer   HISTORY OF PRESENT ILLNESS::Teresa Chapman is a 72 y.o. female  a previous right breast biopsy in the 60s for benign disease who presented for screening mammograms. She was found to have bilateral masses. A biopsy was performed on the right which was benign. On the left ultrasound confirmed the presence of a 1.3 cm mass. A biopsy was performed which showed a infiltrating ductal carcinoma with associated mucin. This tumor was ER positive PR positive HER-2 negative. Ki-67 was 20%. MRI of the bilateral breasts showed a 1.9 cm mass in the left breast with negative nodes. She is recovered well from her biopsies. She actually has more bruising on the right side. She does have a history of a right upper lobe segmentectomy for her carcinoid tumor as well as a history of a basal cell skin cancer being removed. She is here with her husband today. She had her first menses at age 50. She has not menstruating. She is GX P2 with her first live birth at 67. She is hormone replacement for 8-10 years but stopped many years ago.  PREVIOUS RADIATION THERAPY: No  PAST MEDICAL HISTORY:  has a past medical history of Osteopenia; Atrophic vaginitis; Palpitations; Heart defect; Cancer (2009); and Hypertension.    PAST SURGICAL HISTORY: Past Surgical History  Procedure Laterality Date  . Lung removal, partial  2009    right upper lobe  . Vaginal hysterectomy  1974  . Cholecystectomy    . Appendectomy    . Dilation and curettage of uterus    . Tonsillectomy and adenoidectomy    . Thymectomy    . Lung biopsy  2009  . Cardiac catheterization  03/2010    FAMILY HISTORY:  Family History  Problem Relation Age of Onset  . Hypertension Mother   .  Colon cancer Mother   . Breast cancer Mother     Age late 86's  . Heart disease Father     SOCIAL HISTORY:  History  Substance Use Topics  . Smoking status: Never Smoker   . Smokeless tobacco: Not on file  . Alcohol Use: 0.0 oz/week     Comment: rare    ALLERGIES: Codeine and Augmentin  MEDICATIONS:  Current Outpatient Prescriptions  Medication Sig Dispense Refill  . aspirin 81 MG chewable tablet Chew 81 mg by mouth daily.        Marland Kitchen atorvastatin (LIPITOR) 10 MG tablet Take 10 mg by mouth daily.        Marland Kitchen BIOTIN PO Take by mouth.        . Calcium Carbonate-Vit D-Min (CALTRATE PLUS PO) Take by mouth 2 (two) times daily.        . Estradiol (VAGIFEM) 10 MCG TABS vaginal tablet USE VAGINALLY 3 TIMES WEEKLY AS NEEDED & AS DIRECTED  16 tablet  9  . Eszopiclone (LUNESTA PO) Take by mouth.        . Multiple Vitamins-Minerals (CENTRUM PO) Take by mouth daily.        . potassium chloride (KLOR-CON) 10 MEQ CR tablet Take 10 mEq by mouth daily.        . propranolol (INDERAL) 20 MG tablet Take 20 mg by mouth 2 (two) times daily.        Marland Kitchen  triamterene-hydrochlorothiazide (MAXZIDE) 75-50 MG per tablet Take 1 tablet by mouth daily.        . valsartan (DIOVAN) 80 MG tablet Take 80 mg by mouth daily.        . verapamil (CALAN) 120 MG tablet Take 120 mg by mouth 3 (three) times daily.         No current facility-administered medications for this encounter.    REVIEW OF SYSTEMS:  A 15 point review of systems is documented in the electronic medical record. This was obtained by the nursing staff. However, I reviewed this with the patient to discuss relevant findings and make appropriate changes.  Pertinent items are noted in HPI.   PHYSICAL EXAM: There were no vitals filed for this visit.. . He is a pleasant female who appears younger than her stated age. She has no palpable cervical supraclavicular or axillary adenopathy. He has bruising in the outer aspect of the right breast. She has a biopsy site in  the left breast as well. No palpable seroma.  LABORATORY DATA:  Lab Results  Component Value Date   WBC 7.0 11/04/2012   HGB 13.1 11/04/2012   HCT 38.5 11/04/2012   MCV 87.3 11/04/2012   PLT 229 11/04/2012   Lab Results  Component Value Date   NA 140 11/04/2012   K 3.8 11/04/2012   CL 102 10/24/2007   CO2 29 11/04/2012   Lab Results  Component Value Date   ALT 24 11/04/2012   AST 22 11/04/2012   ALKPHOS 98 11/04/2012   BILITOT 0.46 11/04/2012     RADIOGRAPHY: US Breast Bilateral  10/21/2012   *RADIOLOGY REPORT*  Clinical Data:  The patient returns for evaluation of a possible mass in the right upper outer quadrant noted on recent screening study with tomosynthesis dated 10/06/2012.  Upon review of that study today, additional imaging was also obtained  in the medial aspect of the left breast.  DIGITAL DIAGNOSTIC BILATERAL LIMITED MAMMOGRAM  AND BILATERAL BREAST ULTRASOUND:  Comparison:  10/01/2011, 09/27/2010, 09/25/2009, 09/22/2008, 09/21/2007  Findings:  ACR Breast Density Category c:  The breasts are heterogeneously dense, which may obscure small masses.  Spot compression views of the right confirm the presence of a predominantly circumscribed oval mass in the right upper outer quadrant posteriorly.  Spot compression views on the left demonstrate vague increased density in the left upper inner quadrant posteriorly.  On physical exam, no mass is palpated in either breast.  Ultrasound is performed, showing a hypoechoic irregular mass at 11 o'clock 4 cm from the left nipple measuring 1.3 x 0.9 x 1.2 cm.  No abnormal lymph nodes are noted on the left. Ultrasound-guided core needle biopsy is suggested.  Sonography of the right breast demonstrates a probably benign cyst at 9:30 o'clock 5 cm from the right nipple.  There is may be slight wall thickening. Ultrasound-guided aspiration is suggested.  IMPRESSION:  1.  Hypoechoic irregular mass at 11 o'clock 4 cm from the left nipple. 2.  Probably benign cyst  with slight wall thickening in 09:30 o'clock 5 cm from the right nipple.  RECOMMENDATION:  1.  Ultrasound-guided core needle biopsy is suggested on the left. 2.  Ultrasound-guided aspiration is suggested on the right.  These procedures have been scheduled for 10/28/2012 at 8:00 a.m.  I have discussed the findings and recommendations with the patient. Results were also provided in writing at the conclusion of the visit.  If applicable, a reminder letter will be sent to the patient regarding  the next appointment.  BI-RADS CATEGORY 4:  Suspicious abnormality - biopsy should be considered.   Original Report Authenticated By: Cain Saupe, M.D.   Mr Breast Bilateral W Wo Contrast  11/03/2012   *RADIOLOGY REPORT*  Clinical Data: Recently diagnosed left breast invasive ductal carcinoma.  BUN and creatinine were obtained on site at [facility] at [address] Results:  BUN 13 mg/dL,  Creatinine 1.0 mg/dL.  BILATERAL BREAST MRI WITH AND WITHOUT CONTRAST  Technique: Multiplanar, multisequence MR images of both breasts were obtained prior to and following the intravenous administration of 14ml of MultiHance.  THREE-DIMENSIONAL MR IMAGE RENDERING ON INDEPENDENT WORKSTATION:  Three-dimensional MR images were rendered by post-processing of the original MR data on an independent workstation.  The three- dimensional MR images were interpreted, and findings are reported in the following complete MRI report for this study.  Comparison:   Recent imaging examinations.  FINDINGS:  Breast composition:  b.  Scattered fibroglandular tissue  Background parenchymal enhancement: Moderate  Right breast:   No mass or abnormal enhancement.  Left breast:  1.9 x 1.7 x 1.3 cm lobulated enhancing mass in the central portion of the breast, posteriorly and slightly laterally. This contains a biopsy tract as well as a biopsy marker clip artifact at the posterior, superior aspect of the mass.  No other masses or areas of enhancement  suspicious for malignancy in the left breast.  Lymph nodes:   No abnormal appearing lymph nodes.  Ancillary findings:  Small to moderate-sized hiatal hernia.  IMPRESSION:  1.  1.9 x 1.7 x 1.3 cm biopsy-proven invasive ductal carcinoma in the left breast, as described above. 2.  Small to moderate-sized hiatal hernia. 3.  Otherwise, unremarkable examination.  RECOMMENDATION: Treatment plan.  BI-RADS CATEGORY 6:  Known biopsy-proven malignancy - appropriate action should be taken.   Original Report Authenticated By: Beckie Salts, M.D.   Dg Bone Density  11/02/2012   *RADIOLOGY REPORT*  Clinical Data: 72 year old postmenopausal female  DUAL X-RAY ABSORPTIOMETRY (DXA) FOR BONE MINERAL DENSITY  AP LUMBAR SPINE (L1-L2, L4)  Bone Mineral Density (BMD):            0.934 g/cm2 Young Adult T Score:                          -0.9 Z Score:                                                1.4  LEFT FEMUR NECK  Bone Mineral Density (BMD):             0.705 g/cm2 Young Adult T Score:                           -1.3 Z Score:                                                 0.7  ASSESSMENT:  Patient's diagnostic category is LOW BONE MASS by WHO Criteria.  FRACTURE RISK: MODERATE  FRAX: Based on the World Health Organization FRAX model, the 10 year probability of a major osteoporotic fracture is 16%.  The 10 year  probability of a hip fracture is 6.5%.  Comparison: The bone mineral density lumbar spine has increased by 19.2% and the bone mineral density left hip has increased by 7.8% when compared to the baseline study dated 08/12/2003.  Please see the attached report for additional comparisons.  RECOMMENDATIONS:  All patients should ensure an adequate intake of dietary calcium (1200mg  daily) and vitamin D (800 IU daily) unless contraindicated. The National Osteoporosis Foundation recommends that FDA-approved medical therapies be considered in postmenopausal women and mean age 22 or older with a:  1)    Hip or vertebral (clinical or  morphometric) fracture.  2)   T-score of -2.5 or lower at the spine or hip.  3)   Ten-year fracture probability by FRAX of 3% or greater for hip fracture or 20% or greater for major osteoporotic fracture.  FOLLOW-UP:  People with diagnosed cases of osteoporosis or at high risk for fracture should have regular bone mineral density tests.  For patients eligible for Medicare, routine testing is allowed once every 2 years.  The testing frequency can be increased to one year for patients who have rapidly progressing disease, those who are receiving or discontinuing medical therapy to restore bone mass, or have additional risk factors.   Original Report Authenticated By: Baird Lyons, M.D.   Mm Digital Diagnostic Bilat  10/28/2012   *RADIOLOGY REPORT*  Clinical Data:  Status post bilateral ultrasound guided core biopsies.  DIGITAL DIAGNOSTIC BILATERAL MAMMOGRAM  Comparison:  Previous exams.  Findings:  Films are performed following ultrasound guided biopsy of mass in the 9:30 o'clock location of the right breast and 11 o'clock location of the left breast.  A top hat shaped clip is identified in the lateral portion of the right breast as expected. Slightly medial in location of the left breast, a cylindrical clip is identified following biopsy.  IMPRESSION: Bilateral tissue marker clips are in expected locations after biopsy.   Original Report Authenticated By: Norva Pavlov, M.D.   Mm Digital Diagnostic Bilat Ltd  10/21/2012   *RADIOLOGY REPORT*  Clinical Data:  The patient returns for evaluation of a possible mass in the right upper outer quadrant noted on recent screening study with tomosynthesis dated 10/06/2012.  Upon review of that study today, additional imaging was also obtained  in the medial aspect of the left breast.  DIGITAL DIAGNOSTIC BILATERAL LIMITED MAMMOGRAM  AND BILATERAL BREAST ULTRASOUND:  Comparison:  10/01/2011, 09/27/2010, 09/25/2009, 09/22/2008, 09/21/2007  Findings:  ACR Breast Density Category  c:  The breasts are heterogeneously dense, which may obscure small masses.  Spot compression views of the right confirm the presence of a predominantly circumscribed oval mass in the right upper outer quadrant posteriorly.  Spot compression views on the left demonstrate vague increased density in the left upper inner quadrant posteriorly.  On physical exam, no mass is palpated in either breast.  Ultrasound is performed, showing a hypoechoic irregular mass at 11 o'clock 4 cm from the left nipple measuring 1.3 x 0.9 x 1.2 cm.  No abnormal lymph nodes are noted on the left. Ultrasound-guided core needle biopsy is suggested.  Sonography of the right breast demonstrates a probably benign cyst at 9:30 o'clock 5 cm from the right nipple.  There is may be slight wall thickening. Ultrasound-guided aspiration is suggested.  IMPRESSION:  1.  Hypoechoic irregular mass at 11 o'clock 4 cm from the left nipple. 2.  Probably benign cyst with slight wall thickening in 09:30 o'clock 5 cm from the right nipple.  RECOMMENDATION:  1.  Ultrasound-guided core needle biopsy is suggested on the left. 2.  Ultrasound-guided aspiration is suggested on the right.  These procedures have been scheduled for 10/28/2012 at 8:00 a.m.  I have discussed the findings and recommendations with the patient. Results were also provided in writing at the conclusion of the visit.  If applicable, a reminder letter will be sent to the patient regarding the next appointment.  BI-RADS CATEGORY 4:  Suspicious abnormality - biopsy should be considered.   Original Report Authenticated By: Cain Saupe, M.D.   Mm Digital Screening  10/06/2012   CLINICAL DATA:  Screening.  EXAM: DIGITAL SCREENING BILATERAL MAMMOGRAM WITH CAD  DIGITAL BREAST TOMO SYNTHESIS  Digital breast tomo synthesis images are acquired in two projections. These images are reviewed in combination with the digital mammogram, confirming the findings below.  COMPARISON:  None/Previous Exams  ACR  BREAST DENSITY  ACR Breast Density Category 3: The breast tissue is heterogeneously dense.  FINDINGS: In the right breast, a possible mass warrants further evaluation with spot compression views and possibly ultrasound. In the left breast, no masses or malignant type calcifications are identified. Images were processed with CAD.  IMPRESSION: Further evaluation is suggested for possible mass in the right breast.  0: Incomplete. Need additional imaging evaluation and/or prior mammograms for comparison.  RECOMMENDATION: Diagnostic mammogram and possibly ultrasound of the right breast. (Code:FI-R-70M)  The patient will be contacted regarding the findings, and additional imaging will be scheduled.   Electronically Signed   By: Rosalie Gums   On: 10/06/2012 16:04   Mm Radiologist Eval And Mgmt  10/29/2012   *RADIOLOGY REPORT*  ESTABLISHED PATIENT OFFICE VISIT - LEVEL III ([id number])  Chief Complaint:  The patient is post bilateral ultrasound core biopsy.  History:  The patient states she is doing well since her bilateral ultrasound-guided core biopsies.  The patient was given the results of her right breast biopsy which was compatible with fibrocystic change and usual ductal hyperplasia as this is concordant with imaging findings.  The patient was also given the results of her left breast biopsy which was compatible with grade II invasive ductal carcinoma which is compatible with the imaging findings. The patient's immediate questions and concerns were addressed.  Review of Systems:  Patient states no significant problems at the biopsy sites.  Exam:  Biopsy sites are clean and dry without signs of hematoma or infection.  Assessment and Plan:  The patient was given an appointment for breast MRI 11/03/2012 at 12:00 p.m.  The patient was also given an appointment to the Multidisciplinary Clinic 11/04/2012.   Original Report Authenticated By: Elberta Fortis, M.D.   [place] Lt Breast Bx W Loc Dev 1st Lesion Img Bx Spec US  Guide  10/28/2012   *RADIOLOGY REPORT*  Clinical Data:  The patient returns for bilateral breast biopsies. The  ULTRASOUND GUIDED VACUUM ASSISTED CORE BIOPSY OF THE RIGHT AND LEFT BREAST  Comparison: Previous exams.  I met with the patient and we discussed the procedure of ultrasound- guided biopsy, including benefits and alternatives.  We discussed the high likelihood of a successful procedure. We discussed the risks of the procedure including infection, bleeding, tissue injury, clip migration, and inadequate sampling.  Informed written consent was given. The usual time-out protocol was performed immediately prior to the procedure.  Using sterile technique and 2% Lidocaine as local anesthetic, under direct ultrasound visualization, a 12 gauge vacuum-assisteddevice was used to perform biopsy of cystic mass in the 9:30 o'clock location  of the right breast using a caudal approach. At the conclusion of the procedure, a  tissue marker clip was deployed into the biopsy cavity.  Follow-up 2-view mammogram was performed and dictated separately.  Using the same technique, a biopsy was performed of the irregular mass in the 10 o'clock location of the left breast near the nipple. A tissue marker clip was placed in the lesion at the conclusion of the biopsy.  Follow-up two-view mammogram was performed and is dictated separately.  IMPRESSION: Ultrasound-guided biopsy of right and left breast lesions.  No apparent complications.   Original Report Authenticated By: Norva Pavlov, M.D.   [place] Rt Breast Bx W Loc Dev 1st Lesion Img Bx Spec US Guide  10/28/2012   *RADIOLOGY REPORT*  Clinical Data:  The patient returns for bilateral breast biopsies. The  ULTRASOUND GUIDED VACUUM ASSISTED CORE BIOPSY OF THE RIGHT AND LEFT BREAST  Comparison: Previous exams.  I met with the patient and we discussed the procedure of ultrasound- guided biopsy, including benefits and alternatives.  We discussed the high likelihood of a successful  procedure. We discussed the risks of the procedure including infection, bleeding, tissue injury, clip migration, and inadequate sampling.  Informed written consent was given. The usual time-out protocol was performed immediately prior to the procedure.  Using sterile technique and 2% Lidocaine as local anesthetic, under direct ultrasound visualization, a 12 gauge vacuum-assisteddevice was used to perform biopsy of cystic mass in the 9:30 o'clock location of the right breast using a caudal approach. At the conclusion of the procedure, a  tissue marker clip was deployed into the biopsy cavity.  Follow-up 2-view mammogram was performed and dictated separately.  Using the same technique, a biopsy was performed of the irregular mass in the 10 o'clock location of the left breast near the nipple. A tissue marker clip was placed in the lesion at the conclusion of the biopsy.  Follow-up two-view mammogram was performed and is dictated separately.  IMPRESSION: Ultrasound-guided biopsy of right and left breast lesions.  No apparent complications.   Original Report Authenticated By: Norva Pavlov, M.D.      IMPRESSION: T1 C. N0 invasive ductal carcinoma of the upper outer quadrant of the left breast  PLAN: I spoke with Ms. Lahti about the randomized trials comparing mastectomy and radiation. We discussed the equivalency in terms of survival between these 2 treatment options. We discussed the role of radiation and decreasing local failures in patients who undergo lumpectomy. Discussed the process of simulation the placement tattoos. We discussed 4-6 weeks of treatment as an outpatient. We discussed possible side effects including but not limited to skin redness and fatigue. We discussed techniques to reduce heart dose on left-sided tumors. We discussed the randomized trials showing equal survival and a minimal improvement in local control in elderly women with the addition of radiation to surgery plus antiestrogen  therapy. We discussed that she really has a choice of whether she would like to have a lumpectomy plus radiation lumpectomy plus antiestrogen therapy or lumpectomy plus radiation plus antiestrogen therapy. She is healthy enough that she should probably not have surgery alone. She met with a member of our patient family support staff as well as with physical therapy. Our plan on discussing the scan with her after her surgery is complete.  I spent 40 minutes  face to face with the patient and more than 50% of that time was spent in counseling and/or coordination of care.   ------------------------------------------------  Lurline Hare, MD

## 2012-11-04 NOTE — Addendum Note (Signed)
Addended byEmelia Loron on: 11/04/2012 12:39 PM   Modules accepted: Orders

## 2012-11-04 NOTE — Progress Notes (Signed)
Patient ID: Teresa Chapman, female   DOB: May 02, 1940, 72 y.o.   MRN: [id number]  Chief Complaint  Patient presents with  . Other    HPI Teresa Chapman is a 72 y.o. female.  Referred by Dr Dossie Arbour HPI 60 yof who underwent screening mammogram with bilateral masses that right side ends up being benign with fibrocystic changes.  The left side had a mammographic abnormality that on u/s showed a 1.3x0.9x1.2 cm mass.  On MRI there is a 1.9x1.7x1.3 mass in left breast without any other abnormalities.  This underwent a biopsy that shows invasive ductal carcinoma that is er/pr positive, her2 neu not amplified with Ki67 of 20%.  She has no complaints referable to her breasts right now.   She has a prior history of a lung carcinoid that was resected that she has done well from.  Past Medical History  Diagnosis Date  . Osteopenia   . Atrophic vaginitis   . Palpitations   . Heart defect     INTRAMYOCARDIAL BRIDGE  . Cancer 2009    lung  . Hypertension   . Anxiety   . Hot flashes     Past Surgical History  Procedure Laterality Date  . Lung removal, partial  2009    right upper lobe  . Vaginal hysterectomy  1974  . Cholecystectomy    . Appendectomy    . Dilation and curettage of uterus    . Tonsillectomy and adenoidectomy    . Thymectomy    . Lung biopsy  2009  . Cardiac catheterization  03/2010    Family History  Problem Relation Age of Onset  . Hypertension Mother   . Colon cancer Mother   . Breast cancer Mother     Age late 56's  . Heart disease Father     Social History History  Substance Use Topics  . Smoking status: Never Smoker   . Smokeless tobacco: Not on file  . Alcohol Use: 0.0 oz/week     Comment: rare    Allergies  Allergen Reactions  . Augmentin (Amoxicillin-Pot Clavulanate) Rash  . Codeine Rash    REACTION: hyperactivity, itching    Current Outpatient Prescriptions  Medication Sig Dispense Refill  . aspirin 81 MG chewable tablet Chew 81 mg by mouth  daily.        Marland Kitchen atorvastatin (LIPITOR) 10 MG tablet Take 10 mg by mouth daily.        Marland Kitchen BIOTIN PO Take 5,000 mg by mouth 2 (two) times a week.       . Calcium Carbonate-Vit D-Min (CALTRATE PLUS PO) Take 600 mg by mouth 2 (two) times daily.       . Estradiol (VAGIFEM) 10 MCG TABS vaginal tablet USE VAGINALLY 3 TIMES WEEKLY AS NEEDED & AS DIRECTED  16 tablet  9  . Eszopiclone (LUNESTA PO) Take by mouth.        . Multiple Vitamins-Minerals (CENTRUM PO) Take by mouth daily.        . potassium chloride (KLOR-CON) 10 MEQ CR tablet Take 20 mEq by mouth 2 (two) times daily.       . propranolol (INDERAL) 20 MG tablet Take 20 mg by mouth 2 (two) times daily.        Marland Kitchen triamterene-hydrochlorothiazide (MAXZIDE) 75-50 MG per tablet Take 0.5 tablets by mouth daily.       . valsartan (DIOVAN) 80 MG tablet Take 80 mg by mouth daily.        Marland Kitchen  verapamil (CALAN-SR) 180 MG CR tablet Take 180 mg by mouth at bedtime.       No current facility-administered medications for this visit.    Review of Systems Review of Systems  Constitutional: Negative for fever, chills and unexpected weight change.  HENT: Negative for hearing loss, congestion, sore throat, trouble swallowing and voice change.   Eyes: Negative for visual disturbance.  Respiratory: Positive for shortness of breath. Negative for cough and wheezing.   Cardiovascular: Negative for chest pain, palpitations and leg swelling.  Gastrointestinal: Negative for nausea, vomiting, abdominal pain, diarrhea, constipation, blood in stool, abdominal distention and anal bleeding.  Genitourinary: Negative for hematuria, vaginal bleeding and difficulty urinating.  Musculoskeletal: Negative for arthralgias.  Skin: Negative for rash and wound.  Neurological: Negative for seizures, syncope and headaches.  Hematological: Negative for adenopathy. Does not bruise/bleed easily.  Psychiatric/Behavioral: Negative for confusion.    There were no vitals taken for this  visit.  Physical Exam Physical Exam  Vitals reviewed. Constitutional: She appears well-developed and well-nourished.  Eyes: No scleral icterus.  Neck: Neck supple.  Cardiovascular: Normal rate, regular rhythm and normal heart sounds.   Pulmonary/Chest: Effort normal and breath sounds normal. She has no wheezes. She has no rales. Right breast exhibits no inverted nipple, no mass, no nipple discharge, no skin change and no tenderness. Left breast exhibits no inverted nipple, no mass, no nipple discharge, no skin change and no tenderness. Breasts are symmetrical.  Lymphadenopathy:    She has no cervical adenopathy.    She has no axillary adenopathy.       Right: No supraclavicular adenopathy present.       Left: No supraclavicular adenopathy present.    Data Reviewed BILATERAL BREAST MRI WITH AND WITHOUT CONTRAST  Technique: Multiplanar, multisequence MR images of both breasts  were obtained prior to and following the intravenous administration  of 14ml of MultiHance.  THREE-DIMENSIONAL MR IMAGE RENDERING ON INDEPENDENT WORKSTATION:  Three-dimensional MR images were rendered by post-processing of the  original MR data on an independent workstation. The three-  dimensional MR images were interpreted, and findings are reported  in the following complete MRI report for this study.  Comparison: Recent imaging examinations.  FINDINGS:  Breast composition: b. Scattered fibroglandular tissue  Background parenchymal enhancement: Moderate  Right breast: No mass or abnormal enhancement.  Left breast: 1.9 x 1.7 x 1.3 cm lobulated enhancing mass in the  central portion of the breast, posteriorly and slightly laterally.  This contains a biopsy tract as well as a biopsy marker clip  artifact at the posterior, superior aspect of the mass. No other  masses or areas of enhancement suspicious for malignancy in the  left breast.  Lymph nodes: No abnormal appearing lymph nodes.  Ancillary findings:  Small to moderate-sized hiatal hernia.  IMPRESSION:  1. 1.9 x 1.7 x 1.3 cm biopsy-proven invasive ductal carcinoma in  the left breast, as described above.  2. Small to moderate-sized hiatal hernia.  3. Otherwise, unremarkable examination.   Assessment    Left breast cancer, central, clinical stage I    Plan    Left breast wire guided lumpectomy, left axillary sentinel node biopsy I will plan on keeping her overnight after surgery. We discussed the staging and pathophysiology of breast cancer. We discussed all of the different options for treatment for breast cancer including surgery, chemotherapy, radiation therapy, Herceptin, and antiestrogen therapy.   We discussed a sentinel lymph node biopsy as she does not appear to having  lymph node involvement right now. We discussed the performance of that with injection of radioactive tracer and blue dye. We discussed that she would have an incision underneath her axillary hairline. We discussed that there is a bout a 10-20% chance of having a positive node with a sentinel lymph node biopsy and we will await the permanent pathology to make any other first further decisions in terms of her treatment. One of these options might be to return to the operating room to perform an axillary lymph node dissection. We discussed about a 1-2% risk lifetime of chronic shoulder pain as well as lymphedema associated with a sentinel lymph node biopsy.  We discussed the options for treatment of the breast cancer which included lumpectomy versus a mastectomy. We discussed the performance of the lumpectomy with a wire placement. We discussed a 10-20% chance of a positive margin requiring reexcision in the operating room. We also discussed that she may need radiation therapy or antiestrogen therapy or both if she undergoes lumpectomy. We discussed the mastectomy and the postoperative care for that as well. We discussed that there is no difference in her survival whether she  undergoes lumpectomy with radiation therapy or antiestrogen therapy versus a mastectomy. There is a slight difference in the local recurrence rate being 3-5% with lumpectomy and about 1% with a mastectomy. We discussed the risks of operation including bleeding, infection, possible reoperation. She understands her further therapy will be based on what her stages at the time of her operation.          Dakisha Schoof 11/04/2012, 12:16 PM

## 2012-11-04 NOTE — Progress Notes (Signed)
ID: Teresa Chapman OB: 12/13/1940  MR#: [id number]  MVH#:[id number]  PCP: Garlan Fillers, MD GYN:  Colin Broach SU: Emelia Loron OTHER MD: Lurline Hare, Karlyn Agee   HISTORY OF PRESENT ILLNESS: Teresa Chapman had bilateral screening mammography at the breast Center 10/06/2012 showing a possible mass in the left breast. Additional views showed that the right breast mass was a simple cyst. However spot compression views on the left showed an area of increased density in the upper inner quadrant. There was no palpable mass on physical exam. Ultrasound however confirmed a hypoechoic irregular mass in the left breast measuring 1.3 cm. Biopsy of this mass 10/28/2012 showed an invasive ductal carcinoma, grade 2, estrogen receptor 90% positive, progesterone receptor 10% positive, with an MIB-1 of 20% and no HER-2 amplification.  On 11/03/2012 the patient underwent bilateral breast MRI this showed only the left breast mass in question, which measured 1.9 cm on this modality. There were no abnormal appearing lymph nodes.  The patient's subsequent history is as detailed below  INTERVAL HISTORY: Teresa Chapman was seen at the multidisciplinary breast cancer clinic 11/04/2012 accompanied by her husband Teresa Chapman  REVIEW OF SYSTEMS: There were no unusual symptoms leading to the mammography, which was entirely routine. Also she tolerated the biopsy without any unusual complications. She sometimes short of breath when climbing stairs, keeps a dry cough, and has mild stress urinary incontinence. She has moderate hot flashes. Otherwise a detailed review of systems today was noncontributory  PAST MEDICAL HISTORY: Past Medical History  Diagnosis Date  . Osteopenia   . Atrophic vaginitis   . Palpitations   . Heart defect     INTRAMYOCARDIAL BRIDGE  . Cancer 2009    lung  . Hypertension   . Anxiety   . Hot flashes     PAST SURGICAL HISTORY: Past Surgical History  Procedure Laterality Date  . Lung removal, partial   2009    right upper lobe  . Vaginal hysterectomy  1974  . Cholecystectomy    . Appendectomy    . Dilation and curettage of uterus    . Tonsillectomy and adenoidectomy    . Thymectomy    . Lung biopsy  2009  . Cardiac catheterization  03/2010    FAMILY HISTORY Family History  Problem Relation Age of Onset  . Hypertension Mother   . Colon cancer Mother   . Breast cancer Mother     Age late 35's  . Heart disease Father    the patient's father died at the age of 4 from a myocardial infarction. The patient's mother died at the age of 41 from Alzheimer's disease. She had been diagnosed with colon cancer at the age of 43 and breast cancer at the age of 6. The patient had one brother who died at birth. No sisters. There is no history of ovarian cancer in the family.  GYNECOLOGIC HISTORY:  Menarche age 30, menopause in her early 20s. She took hormone replacement approximately 10 years. The patient is GX P2, with first live birth at age 70.  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 Remonia Richter. Her husband [name] "Teresa Chapman" anxious is a retired Human resources officer. Son Teresa Chapman lives in [place] where he works in Consulting civil engineer. Son Teresa Chapman [place] in [place] where he is a Transport planner. The patient has 3 grandchildren. She attends the [employer]     ADVANCED DIRECTIVES:  In place   HEALTH MAINTENANCE: History  Substance Use Topics  . Smoking status: Never Smoker   . Smokeless tobacco: Not on file  . Alcohol Use: 0.0 oz/week     Comment: rare     Colonoscopy:  PAP:  Bone density:  Lipid panel:  Allergies  Allergen Reactions  . Augmentin (Amoxicillin-Pot Clavulanate) Rash  . Codeine Rash    REACTION: hyperactivity, itching    Current Outpatient Prescriptions  Medication Sig Dispense Refill  . aspirin 81 MG chewable tablet Chew 81 mg by mouth daily.        Marland Kitchen atorvastatin  (LIPITOR) 10 MG tablet Take 10 mg by mouth daily.        Marland Kitchen BIOTIN PO Take 5,000 mg by mouth 2 (two) times a week.       . Calcium Carbonate-Vit D-Min (CALTRATE PLUS PO) Take 600 mg by mouth 2 (two) times daily.       . Estradiol (VAGIFEM) 10 MCG TABS vaginal tablet USE VAGINALLY 3 TIMES WEEKLY AS NEEDED & AS DIRECTED  16 tablet  9  . Multiple Vitamins-Minerals (CENTRUM PO) Take by mouth daily.        . potassium chloride (KLOR-CON) 10 MEQ CR tablet Take 20 mEq by mouth 2 (two) times daily.       . propranolol (INDERAL) 20 MG tablet Take 20 mg by mouth 2 (two) times daily.        Marland Kitchen triamterene-hydrochlorothiazide (MAXZIDE) 75-50 MG per tablet Take 0.5 tablets by mouth daily.       . valsartan (DIOVAN) 80 MG tablet Take 80 mg by mouth daily.        . verapamil (CALAN-SR) 180 MG CR tablet Take 180 mg by mouth at bedtime.      . Eszopiclone (LUNESTA PO) Take by mouth.         No current facility-administered medications for this visit.    OBJECTIVE: Middle-aged white woman in no acute distress Filed Vitals:   11/04/12 0834  BP: 176/80  Pulse: 69  Temp: 97.8 F (36.6 C)  Resp: 18     Body mass index is 31.21 kg/(m^2).    ECOG FS: 0  Sclerae unicteric Oropharynx clear No cervical or supraclavicular adenopathy Lungs no rales or rhonchi Heart regular rate and rhythm Abd benign MSK no focal spinal tenderness, no peripheral edema Neuro: non-focal, well-oriented, appropriate affect Breasts: The right breast is unremarkable. The left breast is status post recent biopsy. I do not palpate a well-defined mass. I do not see any suspicious skin or nipple change. The left axilla is benign.   LAB RESULTS:  CMP     Component Value Date/Time   NA 140 11/04/2012 0821   NA 139 10/24/2007 0440   K 3.8 11/04/2012 0821   K 3.5 10/24/2007 0440   CL 102 10/24/2007 0440   CO2 29 11/04/2012 0821   CO2 27 10/24/2007 0440   GLUCOSE 83 11/04/2012 0821   GLUCOSE 100* 10/24/2007 0440   BUN 18.8 11/04/2012 0821    BUN 17 10/24/2007 0440   CREATININE 0.9 11/04/2012 0821   CREATININE 1.03 10/24/2007 0440   CALCIUM 9.8 11/04/2012 0821   CALCIUM 8.8 10/24/2007 0440   PROT 7.1 11/04/2012 0821   PROT 5.7* 10/21/2007 0345   ALBUMIN 3.8 11/04/2012 0821   ALBUMIN 3.1* 10/21/2007 0345   AST 22 11/04/2012 0821   AST 29 10/21/2007 0345   ALT 24 11/04/2012 0821   ALT 20 10/21/2007 0345  ALKPHOS 98 11/04/2012 0821   ALKPHOS 65 10/21/2007 0345   BILITOT 0.46 11/04/2012 0821   BILITOT 0.6 10/21/2007 0345   GFRNONAA 53* 10/24/2007 0440   GFRAA  Value: >60        The eGFR has been calculated using the MDRD equation. This calculation has not been validated in all clinical 10/24/2007 0440    I No results found for this basename: SPEP, UPEP,  kappa and lambda light chains    Lab Results  Component Value Date   WBC 7.0 11/04/2012   NEUTROABS 3.9 11/04/2012   HGB 13.1 11/04/2012   HCT 38.5 11/04/2012   MCV 87.3 11/04/2012   PLT 229 11/04/2012      Chemistry      Component Value Date/Time   NA 140 11/04/2012 0821   NA 139 10/24/2007 0440   K 3.8 11/04/2012 0821   K 3.5 10/24/2007 0440   CL 102 10/24/2007 0440   CO2 29 11/04/2012 0821   CO2 27 10/24/2007 0440   BUN 18.8 11/04/2012 0821   BUN 17 10/24/2007 0440   CREATININE 0.9 11/04/2012 0821   CREATININE 1.03 10/24/2007 0440      Component Value Date/Time   CALCIUM 9.8 11/04/2012 0821   CALCIUM 8.8 10/24/2007 0440   ALKPHOS 98 11/04/2012 0821   ALKPHOS 65 10/21/2007 0345   AST 22 11/04/2012 0821   AST 29 10/21/2007 0345   ALT 24 11/04/2012 0821   ALT 20 10/21/2007 0345   BILITOT 0.46 11/04/2012 0821   BILITOT 0.6 10/21/2007 0345       No results found for this basename: LABCA2    No components found with this basename: LABCA125    No results found for this basename: INR,  in the last 168 hours  Urinalysis    Component Value Date/Time   COLORURINE YELLOW 10/15/2011 1000   APPEARANCEUR CLEAR 10/15/2011 1000   LABSPEC 1.014 10/15/2011 1000   PHURINE 7.5 10/15/2011 1000   GLUCOSEU NEG 10/15/2011  1000   HGBUR NEG 10/15/2011 1000   BILIRUBINUR NEG 10/15/2011 1000   KETONESUR NEG 10/15/2011 1000   PROTEINUR NEG 10/15/2011 1000   UROBILINOGEN 1 10/15/2011 1000   NITRITE NEG 10/15/2011 1000   LEUKOCYTESUR NEG 10/15/2011 1000    STUDIES: US Breast Bilateral  10/21/2012   *RADIOLOGY REPORT*  Clinical Data:  The patient returns for evaluation of a possible mass in the right upper outer quadrant noted on recent screening study with tomosynthesis dated 10/06/2012.  Upon review of that study today, additional imaging was also obtained  in the medial aspect of the left breast.  DIGITAL DIAGNOSTIC BILATERAL LIMITED MAMMOGRAM  AND BILATERAL BREAST ULTRASOUND:  Comparison:  10/01/2011, 09/27/2010, 09/25/2009, 09/22/2008, 09/21/2007  Findings:  ACR Breast Density Category c:  The breasts are heterogeneously dense, which may obscure small masses.  Spot compression views of the right confirm the presence of a predominantly circumscribed oval mass in the right upper outer quadrant posteriorly.  Spot compression views on the left demonstrate vague increased density in the left upper inner quadrant posteriorly.  On physical exam, no mass is palpated in either breast.  Ultrasound is performed, showing a hypoechoic irregular mass at 11 o'clock 4 cm from the left nipple measuring 1.3 x 0.9 x 1.2 cm.  No abnormal lymph nodes are noted on the left. Ultrasound-guided core needle biopsy is suggested.  Sonography of the right breast demonstrates a probably benign cyst at 9:30 o'clock 5 cm from the right nipple.  There is may be slight wall thickening. Ultrasound-guided aspiration is suggested.  IMPRESSION:  1.  Hypoechoic irregular mass at 11 o'clock 4 cm from the left nipple. 2.  Probably benign cyst with slight wall thickening in 09:30 o'clock 5 cm from the right nipple.  RECOMMENDATION:  1.  Ultrasound-guided core needle biopsy is suggested on the left. 2.  Ultrasound-guided aspiration is suggested on the right.  These  procedures have been scheduled for 10/28/2012 at 8:00 a.m.  I have discussed the findings and recommendations with the patient. Results were also provided in writing at the conclusion of the visit.  If applicable, a reminder letter will be sent to the patient regarding the next appointment.  BI-RADS CATEGORY 4:  Suspicious abnormality - biopsy should be considered.   Original Report Authenticated By: Cain Saupe, M.D.   Mr Breast Bilateral W Wo Contrast  11/03/2012   *RADIOLOGY REPORT*  Clinical Data: Recently diagnosed left breast invasive ductal carcinoma.  BUN and creatinine were obtained on site at [facility] at [address] Results:  BUN 13 mg/dL,  Creatinine 1.0 mg/dL.  BILATERAL BREAST MRI WITH AND WITHOUT CONTRAST  Technique: Multiplanar, multisequence MR images of both breasts were obtained prior to and following the intravenous administration of 14ml of MultiHance.  THREE-DIMENSIONAL MR IMAGE RENDERING ON INDEPENDENT WORKSTATION:  Three-dimensional MR images were rendered by post-processing of the original MR data on an independent workstation.  The three- dimensional MR images were interpreted, and findings are reported in the following complete MRI report for this study.  Comparison:   Recent imaging examinations.  FINDINGS:  Breast composition:  b.  Scattered fibroglandular tissue  Background parenchymal enhancement: Moderate  Right breast:   No mass or abnormal enhancement.  Left breast:  1.9 x 1.7 x 1.3 cm lobulated enhancing mass in the central portion of the breast, posteriorly and slightly laterally. This contains a biopsy tract as well as a biopsy marker clip artifact at the posterior, superior aspect of the mass.  No other masses or areas of enhancement suspicious for malignancy in the left breast.  Lymph nodes:   No abnormal appearing lymph nodes.  Ancillary findings:  Small to moderate-sized hiatal hernia.  IMPRESSION:  1.  1.9 x 1.7 x 1.3 cm biopsy-proven invasive ductal  carcinoma in the left breast, as described above. 2.  Small to moderate-sized hiatal hernia. 3.  Otherwise, unremarkable examination.  RECOMMENDATION: Treatment plan.  BI-RADS CATEGORY 6:  Known biopsy-proven malignancy - appropriate action should be taken.   Original Report Authenticated By: Beckie Salts, M.D.   Dg Bone Density  11/02/2012   *RADIOLOGY REPORT*  Clinical Data: 72 year old postmenopausal female  DUAL X-Teresa Chapman ABSORPTIOMETRY (DXA) FOR BONE MINERAL DENSITY  AP LUMBAR SPINE (L1-L2, L4)  Bone Mineral Density (BMD):            0.934 g/cm2 Young Adult T Score:                          -0.9 Z Score:                                                1.4  LEFT FEMUR NECK  Bone Mineral Density (BMD):             0.705 g/cm2 Young Adult T Score:                           -  1.3 Z Score:                                                 0.7  ASSESSMENT:  Patient's diagnostic category is LOW BONE MASS by WHO Criteria.  FRACTURE RISK: MODERATE  FRAX: Based on the World Health Organization FRAX model, the 10 year probability of a major osteoporotic fracture is 16%.  The 10 year probability of a hip fracture is 6.5%.  Comparison: The bone mineral density lumbar spine has increased by 19.2% and the bone mineral density left hip has increased by 7.8% when compared to the baseline study dated 08/12/2003.  Please see the attached report for additional comparisons.  RECOMMENDATIONS:  All patients should ensure an adequate intake of dietary calcium (1200mg  daily) and vitamin D (800 IU daily) unless contraindicated. The National Osteoporosis Foundation recommends that FDA-approved medical therapies be considered in postmenopausal women and mean age 48 or older with a:  1)    Hip or vertebral (clinical or morphometric) fracture.  2)   T-score of -2.5 or lower at the spine or hip.  3)   Ten-year fracture probability by FRAX of 3% or greater for hip fracture or 20% or greater for major osteoporotic fracture.  FOLLOW-UP:  People with  diagnosed cases of osteoporosis or at high risk for fracture should have regular bone mineral density tests.  For patients eligible for Medicare, routine testing is allowed once every 2 years.  The testing frequency can be increased to one year for patients who have rapidly progressing disease, those who are receiving or discontinuing medical therapy to restore bone mass, or have additional risk factors.   Original Report Authenticated By: Baird Lyons, M.D.   Mm Digital Diagnostic Bilat  10/28/2012   *RADIOLOGY REPORT*  Clinical Data:  Status post bilateral ultrasound guided core biopsies.  DIGITAL DIAGNOSTIC BILATERAL MAMMOGRAM  Comparison:  Previous exams.  Findings:  Films are performed following ultrasound guided biopsy of mass in the 9:30 o'clock location of the right breast and 11 o'clock location of the left breast.  A top hat shaped clip is identified in the lateral portion of the right breast as expected. Slightly medial in location of the left breast, a cylindrical clip is identified following biopsy.  IMPRESSION: Bilateral tissue marker clips are in expected locations after biopsy.   Original Report Authenticated By: Norva Pavlov, M.D.   Mm Digital Diagnostic Bilat [employer]  10/21/2012   *RADIOLOGY REPORT*  Clinical Data:  The patient returns for evaluation of a possible mass in the right upper outer quadrant noted on recent screening study with tomosynthesis dated 10/06/2012.  Upon review of that study today, additional imaging was also obtained  in the medial aspect of the left breast.  DIGITAL DIAGNOSTIC BILATERAL LIMITED MAMMOGRAM  AND BILATERAL BREAST ULTRASOUND:  Comparison:  10/01/2011, 09/27/2010, 09/25/2009, 09/22/2008, 09/21/2007  Findings:  ACR Breast Density Category c:  The breasts are heterogeneously dense, which may obscure small masses.  Spot compression views of the right confirm the presence of a predominantly circumscribed oval mass in the right upper outer quadrant posteriorly.  Spot  compression views on the left demonstrate vague increased density in the left upper inner quadrant posteriorly.  On physical exam, no mass is palpated in either breast.  Ultrasound is performed, showing a hypoechoic irregular mass at 11 o'clock 4 cm from  the left nipple measuring 1.3 x 0.9 x 1.2 cm.  No abnormal lymph nodes are noted on the left. Ultrasound-guided core needle biopsy is suggested.  Sonography of the right breast demonstrates a probably benign cyst at 9:30 o'clock 5 cm from the right nipple.  There is may be slight wall thickening. Ultrasound-guided aspiration is suggested.  IMPRESSION:  1.  Hypoechoic irregular mass at 11 o'clock 4 cm from the left nipple. 2.  Probably benign cyst with slight wall thickening in 09:30 o'clock 5 cm from the right nipple.  RECOMMENDATION:  1.  Ultrasound-guided core needle biopsy is suggested on the left. 2.  Ultrasound-guided aspiration is suggested on the right.  These procedures have been scheduled for 10/28/2012 at 8:00 a.m.  I have discussed the findings and recommendations with the patient. Results were also provided in writing at the conclusion of the visit.  If applicable, a reminder letter will be sent to the patient regarding the next appointment.  BI-RADS CATEGORY 4:  Suspicious abnormality - biopsy should be considered.   Original Report Authenticated By: Cain Saupe, M.D.   Mm Digital Screening  10/06/2012   CLINICAL DATA:  Screening.  EXAM: DIGITAL SCREENING BILATERAL MAMMOGRAM WITH CAD  DIGITAL BREAST TOMO SYNTHESIS  Digital breast tomo synthesis images are acquired in two projections. These images are reviewed in combination with the digital mammogram, confirming the findings below.  COMPARISON:  None/Previous Exams  ACR BREAST DENSITY  ACR Breast Density Category 3: The breast tissue is heterogeneously dense.  FINDINGS: In the right breast, a possible mass warrants further evaluation with spot compression views and possibly ultrasound. In the  left breast, no masses or malignant type calcifications are identified. Images were processed with CAD.  IMPRESSION: Further evaluation is suggested for possible mass in the right breast.  0: Incomplete. Need additional imaging evaluation and/or prior mammograms for comparison.  RECOMMENDATION: Diagnostic mammogram and possibly ultrasound of the right breast. (Code:[id number]-27M)  The patient will be contacted regarding the findings, and additional imaging will be scheduled.   Electronically Signed   By: Rosalie Gums   On: 10/06/2012 16:04   Mm Radiologist Eval And Mgmt  10/29/2012   *RADIOLOGY REPORT*  ESTABLISHED PATIENT OFFICE VISIT - LEVEL III ([id number])  Chief Complaint:  The patient is post bilateral ultrasound core biopsy.  History:  The patient states she is doing well since her bilateral ultrasound-guided core biopsies.  The patient was given the results of her right breast biopsy which was compatible with fibrocystic change and usual ductal hyperplasia as this is concordant with imaging findings.  The patient was also given the results of her left breast biopsy which was compatible with grade II invasive ductal carcinoma which is compatible with the imaging findings. The patient's immediate questions and concerns were addressed.  Review of Systems:  Patient states no significant problems at the biopsy sites.  Exam:  Biopsy sites are clean and dry without signs of hematoma or infection.  Assessment and Plan:  The patient was given an appointment for breast MRI 11/03/2012 at 12:00 p.m.  The patient was also given an appointment to the Multidisciplinary Clinic 11/04/2012.   Original Report Authenticated By: Elberta Fortis, M.D.   [place] Lt Breast Bx W Loc Dev 1st Lesion Img Bx Spec US Guide  10/28/2012   *RADIOLOGY REPORT*  Clinical Data:  The patient returns for bilateral breast biopsies. The  ULTRASOUND GUIDED VACUUM ASSISTED CORE BIOPSY OF THE RIGHT AND LEFT BREAST  Comparison: Previous exams.  I met with the  patient and we discussed the procedure of ultrasound- guided biopsy, including benefits and alternatives.  We discussed the high likelihood of a successful procedure. We discussed the risks of the procedure including infection, bleeding, tissue injury, clip migration, and inadequate sampling.  Informed written consent was given. The usual time-out protocol was performed immediately prior to the procedure.  Using sterile technique and 2% Lidocaine as local anesthetic, under direct ultrasound visualization, a 12 gauge vacuum-assisteddevice was used to perform biopsy of cystic mass in the 9:30 o'clock location of the right breast using a caudal approach. At the conclusion of the procedure, a  tissue marker clip was deployed into the biopsy cavity.  Follow-up 2-view mammogram was performed and dictated separately.  Using the same technique, a biopsy was performed of the irregular mass in the 10 o'clock location of the left breast near the nipple. A tissue marker clip was placed in the lesion at the conclusion of the biopsy.  Follow-up two-view mammogram was performed and is dictated separately.  IMPRESSION: Ultrasound-guided biopsy of right and left breast lesions.  No apparent complications.   Original Report Authenticated By: Norva Pavlov, M.D.   [place] Rt Breast Bx W Loc Dev 1st Lesion Img Bx Spec US Guide  10/28/2012   *RADIOLOGY REPORT*  Clinical Data:  The patient returns for bilateral breast biopsies. The  ULTRASOUND GUIDED VACUUM ASSISTED CORE BIOPSY OF THE RIGHT AND LEFT BREAST  Comparison: Previous exams.  I met with the patient and we discussed the procedure of ultrasound- guided biopsy, including benefits and alternatives.  We discussed the high likelihood of a successful procedure. We discussed the risks of the procedure including infection, bleeding, tissue injury, clip migration, and inadequate sampling.  Informed written consent was given. The usual time-out protocol was performed immediately prior to  the procedure.  Using sterile technique and 2% Lidocaine as local anesthetic, under direct ultrasound visualization, a 12 gauge vacuum-assisteddevice was used to perform biopsy of cystic mass in the 9:30 o'clock location of the right breast using a caudal approach. At the conclusion of the procedure, a  tissue marker clip was deployed into the biopsy cavity.  Follow-up 2-view mammogram was performed and dictated separately.  Using the same technique, a biopsy was performed of the irregular mass in the 10 o'clock location of the left breast near the nipple. A tissue marker clip was placed in the lesion at the conclusion of the biopsy.  Follow-up two-view mammogram was performed and is dictated separately.  IMPRESSION: Ultrasound-guided biopsy of right and left breast lesions.  No apparent complications.   Original Report Authenticated By: Norva Pavlov, M.D.    ASSESSMENT: 72 y.o. [place] woman status post upper inner quadrant left breast biopsy 10/28/2012 for a clinical T1 N0, stage IA invasive ductal carcinoma, grade 2, estrogen receptor 90% positive, progesterone receptor 10% positive, with no HER-2 amplification, and an MIB-1 of 20%  (1) Oncotype DX pending  PLAN: We spent the better part of today's hour-plus visit discussing the biology of breast cancer in general and the specifics of Liadan's tumor. She understands she will benefit from surgery and radiation as well as anti-estrogens. The real issue is chemotherapy. The benefit is likely to be borderline and therefore we will request an Oncotype DX to be sent from her final surgical specimen. My expectation is that this will fall in the intermediate risk group, since the grade of her tumor and the MIB-1 both were in that range.  Nevertheless the  Oncotype will help [place] make a definitive chemotherapy decision and I have scheduled her to return to see me approximately 2 weeks after her planned surgical date. If we decide on chemotherapy likely we  will do cyclophosphamide and paclitaxel x4. Otherwise she tells me she had a bone density just last week at Dr. Reynold Bowen office, and we will obtain those results to help with our anti-estrogen choice.  She knows to call for any problems that may develop before the next visit here.  Lowella Dell, MD   11/04/2012 11:19 AM

## 2012-11-04 NOTE — Progress Notes (Signed)
The patient does have POA/living will but not with her- she is in the process of updating it. She has her [facility] form. No financial issues with checking her in. She wants phone and mail only for communication.

## 2012-11-04 NOTE — Telephone Encounter (Signed)
appts made and printed...td 

## 2012-11-05 ENCOUNTER — Encounter (HOSPITAL_COMMUNITY): Payer: Self-pay

## 2012-11-06 ENCOUNTER — Telehealth (INDEPENDENT_AMBULATORY_CARE_PROVIDER_SITE_OTHER): Payer: Self-pay

## 2012-11-06 DIAGNOSIS — C50912 Malignant neoplasm of unspecified site of left female breast: Secondary | ICD-10-CM

## 2012-11-06 NOTE — Telephone Encounter (Signed)
Dawn calling to ask for [place] to place the radiation oncology referral into epic.

## 2012-11-09 ENCOUNTER — Telehealth: Payer: Self-pay | Admitting: *Deleted

## 2012-11-09 NOTE — Telephone Encounter (Signed)
Called and spoke with patient from [facility] 11/04/12.  No questions or concerns at this time.  Contact information given.

## 2012-11-10 ENCOUNTER — Telehealth: Payer: Self-pay | Admitting: *Deleted

## 2012-11-10 NOTE — Telephone Encounter (Signed)
Error

## 2012-11-11 ENCOUNTER — Encounter: Payer: Self-pay | Admitting: Gynecology

## 2012-11-11 ENCOUNTER — Ambulatory Visit (INDEPENDENT_AMBULATORY_CARE_PROVIDER_SITE_OTHER): Payer: Medicare Other | Admitting: Gynecology

## 2012-11-11 DIAGNOSIS — M949 Disorder of cartilage, unspecified: Secondary | ICD-10-CM | POA: Diagnosis not present

## 2012-11-11 DIAGNOSIS — M8580 Other specified disorders of bone density and structure, unspecified site: Secondary | ICD-10-CM

## 2012-11-11 DIAGNOSIS — M899 Disorder of bone, unspecified: Secondary | ICD-10-CM | POA: Diagnosis not present

## 2012-11-11 NOTE — Patient Instructions (Signed)
Office will call you with vitamin D results. 

## 2012-11-11 NOTE — Progress Notes (Signed)
Patient presents to discuss her recent DEXA result. T score -1.3 left femoral neck. Spine eliminated L3 due to degenerative changes T score -0.9. History of Actonel/Boniva for approximately 5 years stopping 2010. Dr. Verl Dicker note said used for 5-10 years but unable to document this. Bone density overall stable from prior scans. They did a FRAX despite having been treated previously with a major osteoporotic fracture risk of 16% hip fracture risk of 6.5%. Reviewed with the patient and her husband the question as to whether the FRAX would be valid given her prior treatment and the options to include treatment now versus observation with repeat DEXA in 1-2 years discussed. After lengthy discussion to include the risks of treatment including osteonecrosis of the jaw atypical fractures GERD esophagitis/esophageal cancer all reviewed, the patient and her husband prefer observation. She unfortunately was recently diagnosed with breast cancer and is undergoing a left lumpectomy sentinel node biopsy next week. Maximizing calcium reviewed. Check vitamin D level today. Followup with me routinely.

## 2012-11-12 LAB — VITAMIN D 25 HYDROXY (VIT D DEFICIENCY, FRACTURES): Vit D, 25-Hydroxy: 66 ng/mL (ref 30–89)

## 2012-11-13 ENCOUNTER — Encounter (HOSPITAL_COMMUNITY): Payer: Self-pay

## 2012-11-13 ENCOUNTER — Encounter (HOSPITAL_COMMUNITY)
Admission: RE | Admit: 2012-11-13 | Discharge: 2012-11-13 | Disposition: A | Discharge status: Home / Self Care | Payer: Medicare Other | Source: Ambulatory Visit | Attending: General Surgery | Admitting: General Surgery

## 2012-11-13 DIAGNOSIS — C50219 Malignant neoplasm of upper-inner quadrant of unspecified female breast: Secondary | ICD-10-CM | POA: Insufficient documentation

## 2012-11-13 DIAGNOSIS — I10 Essential (primary) hypertension: Secondary | ICD-10-CM | POA: Diagnosis not present

## 2012-11-13 DIAGNOSIS — D0590 Unspecified type of carcinoma in situ of unspecified breast: Secondary | ICD-10-CM | POA: Diagnosis not present

## 2012-11-13 DIAGNOSIS — Z79899 Other long term (current) drug therapy: Secondary | ICD-10-CM | POA: Diagnosis not present

## 2012-11-13 DIAGNOSIS — Z01818 Encounter for other preprocedural examination: Secondary | ICD-10-CM | POA: Insufficient documentation

## 2012-11-13 HISTORY — DX: Insomnia, unspecified: G47.00

## 2012-11-13 HISTORY — DX: Unspecified osteoarthritis, unspecified site: M19.90

## 2012-11-13 HISTORY — DX: Diverticulosis of intestine, part unspecified, without perforation or abscess without bleeding: K57.90

## 2012-11-13 HISTORY — DX: Personal history of colonic polyps: Z86.010

## 2012-11-13 HISTORY — DX: Nausea with vomiting, unspecified: Z98.890

## 2012-11-13 HISTORY — DX: Scoliosis, unspecified: M41.9

## 2012-11-13 HISTORY — DX: Hyperlipidemia, unspecified: E78.5

## 2012-11-13 HISTORY — DX: Malformation of coronary vessels: Q24.5

## 2012-11-13 HISTORY — DX: Personal history of colon polyps, unspecified: Z86.0100

## 2012-11-13 HISTORY — DX: Other specified postprocedural states: R11.2

## 2012-11-13 NOTE — Pre-Procedure Instructions (Signed)
Teresa Chapman  11/13/2012   Your procedure is scheduled on:  Wed, July 30 @ 2:00 PM  Report to [facility] at 12:00 PM.  Call this number if you have problems the morning of surgery: [phone]   Remember:   Do not eat food or drink liquids after midnight.   Take these medicines the morning of surgery with A SIP OF WATER: Propranolol(Inderal)              Stop taking your Aspirin.No Goody's,BC's,Aleve,Ibuprofen,Fish Oil,or any Herbal Medications   Do not wear jewelry, make-up or nail polish.  Do not wear lotions, powders, or perfumes. You may wear deodorant.  Do not shave 48 hours prior to surgery.   Do not bring valuables to the hospital.  [facility] is not responsible                   for any belongings or valuables.  Contacts, dentures or bridgework may not be worn into surgery.  Leave suitcase in the car. After surgery it may be brought to your room.  For patients admitted to the hospital, checkout time is 11:00 AM the day of  discharge.   Patients discharged the day of surgery will not be allowed to drive  home.    Special Instructions: Shower using CHG 2 nights before surgery and the night before surgery.  If you shower the day of surgery use CHG.  Use special wash - you have one bottle of CHG for all showers.  You should use approximately 1/3 of the bottle for each shower.   Please read over the following fact sheets that you were given: Pain Booklet, Coughing and Deep Breathing and Surgical Site Infection Prevention

## 2012-11-13 NOTE — Progress Notes (Addendum)
Dr.Ganji is cardiologist-last visit in 2011  Echo done in 2011 and to be requested from [facility]  Stress test done in 2011 and to be requested from [facility]  Heart cath report in epic  Dr.Daniel Jarold Motto is MEdical MD  EKG and CXR to be requested from Dr.Patterson

## 2012-11-15 ENCOUNTER — Other Ambulatory Visit (HOSPITAL_COMMUNITY): Payer: Self-pay | Admitting: Oncology

## 2012-11-15 NOTE — Progress Notes (Signed)
CT 10/30/2012 shows mild osteopenia at the left femoral neck, with a T score of -1.3.

## 2012-11-17 MED ORDER — CEFAZOLIN SODIUM-DEXTROSE 2-3 GM-% IV SOLR
2.0000 g | INTRAVENOUS | Status: AC
  Administered 2012-11-18: 2 g via INTRAVENOUS
  Filled 2012-11-17: qty 50

## 2012-11-18 ENCOUNTER — Encounter (HOSPITAL_COMMUNITY)
Admission: RE | Disposition: A | Discharge status: Home / Self Care | Payer: Self-pay | Source: Ambulatory Visit | Attending: General Surgery

## 2012-11-18 ENCOUNTER — Ambulatory Visit (HOSPITAL_COMMUNITY)
Admission: RE | Admit: 2012-11-18 | Discharge: 2012-11-18 | Disposition: A | Discharge status: Home / Self Care | Payer: Medicare Other | Source: Ambulatory Visit | Attending: General Surgery | Admitting: General Surgery

## 2012-11-18 ENCOUNTER — Encounter (HOSPITAL_COMMUNITY): Payer: Self-pay | Admitting: Anesthesiology

## 2012-11-18 ENCOUNTER — Ambulatory Visit (HOSPITAL_COMMUNITY): Payer: Medicare Other | Admitting: Anesthesiology

## 2012-11-18 ENCOUNTER — Observation Stay (HOSPITAL_COMMUNITY)
Admission: RE | Admit: 2012-11-18 | Discharge: 2012-11-19 | Disposition: A | Discharge status: Home / Self Care | Payer: Medicare Other | Source: Ambulatory Visit | Attending: General Surgery | Admitting: General Surgery

## 2012-11-18 ENCOUNTER — Ambulatory Visit
Admission: RE | Admit: 2012-11-18 | Discharge: 2012-11-18 | Disposition: A | Discharge status: Home / Self Care | Payer: Medicare Other | Source: Ambulatory Visit | Attending: General Surgery | Admitting: General Surgery

## 2012-11-18 DIAGNOSIS — D0590 Unspecified type of carcinoma in situ of unspecified breast: Secondary | ICD-10-CM | POA: Diagnosis not present

## 2012-11-18 DIAGNOSIS — C50219 Malignant neoplasm of upper-inner quadrant of unspecified female breast: Secondary | ICD-10-CM | POA: Diagnosis not present

## 2012-11-18 DIAGNOSIS — C50919 Malignant neoplasm of unspecified site of unspecified female breast: Secondary | ICD-10-CM | POA: Diagnosis not present

## 2012-11-18 DIAGNOSIS — I10 Essential (primary) hypertension: Secondary | ICD-10-CM | POA: Insufficient documentation

## 2012-11-18 DIAGNOSIS — Z79899 Other long term (current) drug therapy: Secondary | ICD-10-CM | POA: Diagnosis not present

## 2012-11-18 DIAGNOSIS — C50212 Malignant neoplasm of upper-inner quadrant of left female breast: Secondary | ICD-10-CM

## 2012-11-18 HISTORY — PX: BREAST LUMPECTOMY WITH NEEDLE LOCALIZATION AND AXILLARY SENTINEL LYMPH NODE BX: SHX5760

## 2012-11-18 HISTORY — DX: Malignant neoplasm of unspecified part of unspecified bronchus or lung: C34.90

## 2012-11-18 HISTORY — DX: Unspecified viral hepatitis B without hepatic coma: B19.10

## 2012-11-18 SURGERY — BREAST LUMPECTOMY WITH NEEDLE LOCALIZATION AND AXILLARY SENTINEL LYMPH NODE BX
Anesthesia: General | Site: Breast | Laterality: Left | Wound class: Clean

## 2012-11-18 MED ORDER — SODIUM CHLORIDE 0.9 % IV SOLN
INTRAVENOUS | Status: DC
  Administered 2012-11-18 (×2): via INTRAVENOUS

## 2012-11-18 MED ORDER — IRBESARTAN 75 MG PO TABS
75.0000 mg | ORAL_TABLET | Freq: Every day | ORAL | Status: DC
  Administered 2012-11-18: 75 mg via ORAL
  Filled 2012-11-18 (×2): qty 1

## 2012-11-18 MED ORDER — FENTANYL CITRATE 0.05 MG/ML IJ SOLN
25.0000 ug | INTRAMUSCULAR | Status: DC | PRN
  Administered 2012-11-18: 50 ug via INTRAVENOUS

## 2012-11-18 MED ORDER — SODIUM CHLORIDE 0.9 % IV SOLN
INTRAVENOUS | Status: DC | PRN
  Administered 2012-11-18: 14:00:00 via INTRAVENOUS

## 2012-11-18 MED ORDER — ACETAMINOPHEN 10 MG/ML IV SOLN
INTRAVENOUS | Status: AC
  Filled 2012-11-18: qty 100

## 2012-11-18 MED ORDER — GLYCOPYRROLATE 0.2 MG/ML IJ SOLN
INTRAMUSCULAR | Status: DC | PRN
  Administered 2012-11-18: 0.2 mg via INTRAVENOUS

## 2012-11-18 MED ORDER — VERAPAMIL HCL ER 180 MG PO TBCR
180.0000 mg | EXTENDED_RELEASE_TABLET | Freq: Every day | ORAL | Status: DC
  Administered 2012-11-18: 180 mg via ORAL
  Filled 2012-11-18 (×2): qty 1

## 2012-11-18 MED ORDER — ACETAMINOPHEN 10 MG/ML IV SOLN
INTRAVENOUS | Status: DC | PRN
  Administered 2012-11-18: 1000 mg via INTRAVENOUS

## 2012-11-18 MED ORDER — ACETAMINOPHEN 650 MG RE SUPP: 650.0000 mg | Freq: Four times a day (QID) | RECTAL | Status: DC | PRN

## 2012-11-18 MED ORDER — TECHNETIUM TC 99M SULFUR COLLOID FILTERED
1.0000 | Freq: Once | INTRAVENOUS | Status: AC | PRN
  Administered 2012-11-18: 1 via INTRADERMAL

## 2012-11-18 MED ORDER — POTASSIUM CHLORIDE CRYS ER 20 MEQ PO TBCR
20.0000 meq | EXTENDED_RELEASE_TABLET | Freq: Two times a day (BID) | ORAL | Status: DC
  Administered 2012-11-18: 20 meq via ORAL
  Filled 2012-11-18 (×3): qty 1

## 2012-11-18 MED ORDER — 0.9 % SODIUM CHLORIDE (POUR BTL) OPTIME
TOPICAL | Status: DC | PRN
  Administered 2012-11-18: 1000 mL

## 2012-11-18 MED ORDER — EPHEDRINE SULFATE 50 MG/ML IJ SOLN
INTRAMUSCULAR | Status: DC | PRN
  Administered 2012-11-18: 10 mg via INTRAVENOUS
  Administered 2012-11-18: 5 mg via INTRAVENOUS

## 2012-11-18 MED ORDER — PROPOFOL 10 MG/ML IV BOLUS
INTRAVENOUS | Status: DC | PRN
  Administered 2012-11-18: 150 mg via INTRAVENOUS
  Administered 2012-11-18: 30 mg via INTRAVENOUS

## 2012-11-18 MED ORDER — ACETAMINOPHEN 325 MG PO TABS
650.0000 mg | ORAL_TABLET | Freq: Four times a day (QID) | ORAL | Status: DC | PRN
  Administered 2012-11-19: 650 mg via ORAL
  Filled 2012-11-18: qty 2

## 2012-11-18 MED ORDER — METHYLENE BLUE 1 % INJ SOLN
INTRAMUSCULAR | Status: AC
  Filled 2012-11-18: qty 10

## 2012-11-18 MED ORDER — MIDAZOLAM HCL 2 MG/2ML IJ SOLN: 1.0000 mg | INTRAMUSCULAR | Status: DC | PRN

## 2012-11-18 MED ORDER — LACTATED RINGERS IV SOLN: INTRAVENOUS | Status: DC

## 2012-11-18 MED ORDER — DEXAMETHASONE SODIUM PHOSPHATE 4 MG/ML IJ SOLN
INTRAMUSCULAR | Status: DC | PRN
  Administered 2012-11-18: 8 mg via INTRAVENOUS

## 2012-11-18 MED ORDER — BUPIVACAINE HCL (PF) 0.25 % IJ SOLN
INTRAMUSCULAR | Status: AC
  Filled 2012-11-18: qty 30

## 2012-11-18 MED ORDER — BUPIVACAINE-EPINEPHRINE 0.25% -1:200000 IJ SOLN
INTRAMUSCULAR | Status: DC | PRN
  Administered 2012-11-18: 30 mL

## 2012-11-18 MED ORDER — MORPHINE SULFATE 2 MG/ML IJ SOLN
2.0000 mg | INTRAMUSCULAR | Status: DC | PRN
  Administered 2012-11-18: 2 mg via INTRAVENOUS
  Filled 2012-11-18: qty 1

## 2012-11-18 MED ORDER — FENTANYL CITRATE 0.05 MG/ML IJ SOLN
INTRAMUSCULAR | Status: DC | PRN
  Administered 2012-11-18 (×3): 50 ug via INTRAVENOUS

## 2012-11-18 MED ORDER — ONDANSETRON HCL 4 MG/2ML IJ SOLN: 4.0000 mg | Freq: Four times a day (QID) | INTRAMUSCULAR | Status: DC | PRN

## 2012-11-18 MED ORDER — ASPIRIN 81 MG PO CHEW
81.0000 mg | CHEWABLE_TABLET | Freq: Every day | ORAL | Status: DC
  Filled 2012-11-18: qty 1

## 2012-11-18 MED ORDER — LACTATED RINGERS IV SOLN
INTRAVENOUS | Status: DC | PRN
  Administered 2012-11-18 (×2): via INTRAVENOUS

## 2012-11-18 MED ORDER — OXYCODONE HCL 5 MG PO TABS
5.0000 mg | ORAL_TABLET | ORAL | Status: DC | PRN
  Administered 2012-11-19: 5 mg via ORAL
  Filled 2012-11-18: qty 1

## 2012-11-18 MED ORDER — LIDOCAINE HCL (CARDIAC) 20 MG/ML IV SOLN
INTRAVENOUS | Status: DC | PRN
  Administered 2012-11-18: 100 mg via INTRAVENOUS

## 2012-11-18 MED ORDER — FENTANYL CITRATE 0.05 MG/ML IJ SOLN: 50.0000 ug | Freq: Once | INTRAMUSCULAR | Status: DC

## 2012-11-18 MED ORDER — MIDAZOLAM HCL 5 MG/5ML IJ SOLN
INTRAMUSCULAR | Status: DC | PRN
  Administered 2012-11-18: 2 mg via INTRAVENOUS

## 2012-11-18 MED ORDER — ARTIFICIAL TEARS OP OINT
TOPICAL_OINTMENT | OPHTHALMIC | Status: DC | PRN
  Administered 2012-11-18: 1 via OPHTHALMIC

## 2012-11-18 MED ORDER — ONDANSETRON HCL 4 MG/2ML IJ SOLN
INTRAMUSCULAR | Status: DC | PRN
  Administered 2012-11-18: 4 mg via INTRAVENOUS

## 2012-11-18 MED ORDER — TRIAMTERENE-HCTZ 75-50 MG PO TABS
0.5000 | ORAL_TABLET | Freq: Every day | ORAL | Status: DC
  Administered 2012-11-18: 0.5 via ORAL
  Filled 2012-11-18 (×2): qty 0.5

## 2012-11-18 MED ORDER — FENTANYL CITRATE 0.05 MG/ML IJ SOLN
INTRAMUSCULAR | Status: AC
  Filled 2012-11-18: qty 2

## 2012-11-18 MED ORDER — MIDAZOLAM HCL 2 MG/2ML IJ SOLN
INTRAMUSCULAR | Status: AC
  Filled 2012-11-18: qty 2

## 2012-11-18 MED ORDER — PROPRANOLOL HCL 20 MG PO TABS
20.0000 mg | ORAL_TABLET | Freq: Two times a day (BID) | ORAL | Status: DC
  Administered 2012-11-18: 20 mg via ORAL
  Filled 2012-11-18 (×3): qty 1

## 2012-11-18 MED ORDER — FENTANYL CITRATE 0.05 MG/ML IJ SOLN
INTRAMUSCULAR | Status: AC
  Administered 2012-11-18: 50 ug via INTRAVENOUS
  Filled 2012-11-18: qty 2

## 2012-11-18 SURGICAL SUPPLY — 67 items
ADH SKN CLS APL DERMABOND .7 (GAUZE/BANDAGES/DRESSINGS)
APL SKNCLS STERI-STRIP NONHPOA (GAUZE/BANDAGES/DRESSINGS) ×1
APPLIER CLIP 9.375 MED OPEN (MISCELLANEOUS) ×2
APR CLP MED 9.3 20 MLT OPN (MISCELLANEOUS) ×1
BENZOIN TINCTURE PRP APPL 2/3 (GAUZE/BANDAGES/DRESSINGS) ×1 IMPLANT
BINDER BREAST LRG (GAUZE/BANDAGES/DRESSINGS) ×1 IMPLANT
BINDER BREAST XLRG (GAUZE/BANDAGES/DRESSINGS) IMPLANT
BLADE SURG 10 STRL SS (BLADE) ×2 IMPLANT
BLADE SURG 15 STRL LF DISP TIS (BLADE) ×1 IMPLANT
BLADE SURG 15 STRL SS (BLADE) ×2
CANISTER SUCTION 2500CC (MISCELLANEOUS) ×2 IMPLANT
CHLORAPREP W/TINT 26ML (MISCELLANEOUS) ×2 IMPLANT
CLIP APPLIE 9.375 MED OPEN (MISCELLANEOUS) ×1 IMPLANT
CLOTH BEACON ORANGE TIMEOUT ST (SAFETY) ×2 IMPLANT
CONT SPEC 4OZ CLIKSEAL STRL BL (MISCELLANEOUS) ×1 IMPLANT
CONT SPEC STER OR (MISCELLANEOUS) ×2 IMPLANT
COVER PROBE W GEL 5X96 (DRAPES) ×2 IMPLANT
COVER SURGICAL LIGHT HANDLE (MISCELLANEOUS) ×2 IMPLANT
DECANTER SPIKE VIAL GLASS SM (MISCELLANEOUS) ×2 IMPLANT
DERMABOND ADVANCED (GAUZE/BANDAGES/DRESSINGS)
DERMABOND ADVANCED .7 DNX12 (GAUZE/BANDAGES/DRESSINGS) IMPLANT
DEVICE DUBIN SPECIMEN MAMMOGRA (MISCELLANEOUS) ×2 IMPLANT
DRAPE CHEST BREAST 15X10 FENES (DRAPES) ×2 IMPLANT
DRSG PAD ABDOMINAL 8X10 ST (GAUZE/BANDAGES/DRESSINGS) ×1 IMPLANT
DRSG TEGADERM 4X4.75 (GAUZE/BANDAGES/DRESSINGS) ×1 IMPLANT
ELECT CAUTERY BLADE 6.4 (BLADE) ×2 IMPLANT
ELECT REM PT RETURN 9FT ADLT (ELECTROSURGICAL) ×2
ELECTRODE REM PT RTRN 9FT ADLT (ELECTROSURGICAL) ×1 IMPLANT
GLOVE BIO SURGEON STRL SZ7 (GLOVE) ×4 IMPLANT
GLOVE BIOGEL PI IND STRL 7.0 (GLOVE) IMPLANT
GLOVE BIOGEL PI IND STRL 7.5 (GLOVE) ×1 IMPLANT
GLOVE BIOGEL PI INDICATOR 7.0 (GLOVE) ×3
GLOVE BIOGEL PI INDICATOR 7.5 (GLOVE) ×1
GLOVE SURG SS PI 6.5 STRL IVOR (GLOVE) ×1 IMPLANT
GLOVE SURG SS PI 7.0 STRL IVOR (GLOVE) ×2 IMPLANT
GOWN STRL NON-REIN LRG LVL3 (GOWN DISPOSABLE) ×2 IMPLANT
KIT BASIN OR (CUSTOM PROCEDURE TRAY) ×2 IMPLANT
KIT MARKER MARGIN INK (KITS) IMPLANT
KIT ROOM TURNOVER OR (KITS) ×2 IMPLANT
NDL 18GX1X1/2 (RX/OR ONLY) (NEEDLE) ×1 IMPLANT
NDL HYPO 25GX1X1/2 BEV (NEEDLE) ×2 IMPLANT
NEEDLE 18GX1X1/2 (RX/OR ONLY) (NEEDLE) ×2 IMPLANT
NEEDLE HYPO 25GX1X1/2 BEV (NEEDLE) ×2 IMPLANT
NS IRRIG 1000ML POUR BTL (IV SOLUTION) ×2 IMPLANT
PACK SURGICAL SETUP 50X90 (CUSTOM PROCEDURE TRAY) ×2 IMPLANT
PAD ARMBOARD 7.5X6 YLW CONV (MISCELLANEOUS) ×2 IMPLANT
PENCIL BUTTON HOLSTER BLD 10FT (ELECTRODE) ×2 IMPLANT
SPONGE GAUZE 4X4 12PLY (GAUZE/BANDAGES/DRESSINGS) ×1 IMPLANT
SPONGE LAP 18X18 X RAY DECT (DISPOSABLE) ×2 IMPLANT
STAPLER VISISTAT 35W (STAPLE) ×2 IMPLANT
STOCKINETTE IMPERVIOUS 9X36 MD (GAUZE/BANDAGES/DRESSINGS) IMPLANT
STRIP CLOSURE SKIN 1/2X4 (GAUZE/BANDAGES/DRESSINGS) ×1 IMPLANT
SUT MNCRL AB 4-0 PS2 18 (SUTURE) ×4 IMPLANT
SUT SILK 2 0 SH (SUTURE) IMPLANT
SUT VIC AB 2-0 CT1 27 (SUTURE) ×2
SUT VIC AB 2-0 CT1 TAPERPNT 27 (SUTURE) IMPLANT
SUT VIC AB 2-0 SH 27 (SUTURE) ×4
SUT VIC AB 2-0 SH 27XBRD (SUTURE) ×2 IMPLANT
SUT VIC AB 3-0 SH 27 (SUTURE) ×8
SUT VIC AB 3-0 SH 27X BRD (SUTURE) IMPLANT
SUT VIC AB 3-0 SH 27XBRD (SUTURE) ×2 IMPLANT
SYR BULB IRRIGATION 50ML (SYRINGE) ×1 IMPLANT
SYR CONTROL 10ML LL (SYRINGE) ×3 IMPLANT
TOWEL OR 17X24 6PK STRL BLUE (TOWEL DISPOSABLE) ×2 IMPLANT
TOWEL OR 17X26 10 PK STRL BLUE (TOWEL DISPOSABLE) ×2 IMPLANT
TUBE CONNECTING 12X1/4 (SUCTIONS) ×2 IMPLANT
YANKAUER SUCT BULB TIP NO VENT (SUCTIONS) ×2 IMPLANT

## 2012-11-18 NOTE — Op Note (Signed)
Preoperative diagnosis: Clinical stage I left breast cancer Postoperative diagnosis: Same as above Procedure: #1 left breast wire-guided lumpectomy #2 left axillary sentinel lymph node biopsy Surgeon: Dr. Harden Mo Anesthesia: Gen. With LMA Drains: None Estimated blood loss: Normal Specimens: #1 left breast tissue marked with paint #2 additional left lateral margin marked with paint #3 additional left inferior margin marked with paint #4 Sentinel lymph node x3 with counts ranging from 100-678 Sponge and needle count was correct at end of operation Disposition to recovery in stable condition  Indications: This is a 72 year old female who presented after a mammographic abnormality on the left. This underwent biopsy by radiology this was an invasive ductal carcinoma.  She had additional biopsy that was benign. We discussed all of her options and decided to proceed with breast conservation therapy. We discussed the risks and benefits prior to beginning.  Procedure: After informed consent was obtained the patient first had a wire placed at the breast center. I had these mammograms available for my review in the operating room. She was then injected with technetium in the standard fashion. Following this she was administered 2 g of intravenous cefazolin. Sequential compression devices were placed on her legs. She was then placed under general anesthesia with an LMA. Her left breast and axilla were then prepped and draped in the standard sterile surgical fashion. Surgical timeout was performed.  I made a curvilinear incision a ways from the insertion site of the wire. I carried this out down to the level of the wire and brought this in from its remote position. I then proceeded to use cautery to excise the tissue surrounding the wire which ended up including what felt like a palpable mass. I excised this down to the pectoralis muscle including the pectoralis fascia. I then removed this area. I  painted the specimen. I then did a Faxitron mammogram which was confirmed by radiology to have removed the entire specimen, clip, and wire. I did remove 2 additional margins as I was concerned that the these might be close. I then placed 2 clips deep and one clip in each position on the cavity. Hemostasis was obtained. I closed the deep breast tissue with 2-0 Vicryl. The dermis was closed with 3-0 Vicryl and skin with 4-0 Monocryl. I placed Dermabond and Steri-Strips over this incision.  I then located the radioactivity. I made a 2.5 centimeter incision below the axillary hairline and carried this through the axillary fascia. Then I identified 3 sentinel lymph nodes with counts as listed as above. I did place a couple of clips on some small vessels in her axilla. The background radioactivity was less than 20. Hemostasis was obtained. I closed the axillary fascia with 2-0 Vicryl. The dermis was closed with 3-0 Vicryl and the skin with 4-0 Monocryl. I placed a sterile dressing were placed over this. She tolerated this well. Breast binder was placed. She was extubated and transferred to recovery stable.

## 2012-11-18 NOTE — H&P (View-Only) (Signed)
Patient ID: Teresa Chapman, female   DOB: 05/19/1940, 72 y.o.   MRN: [id number]  Chief Complaint  Patient presents with  . Other    HPI Teresa Chapman is a 72 y.o. female.  Referred by Dr Dan Paterson HPI 72 yof who underwent screening mammogram with bilateral masses that right side ends up being benign with fibrocystic changes.  The left side had a mammographic abnormality that on u/s showed a 1.3x0.9x1.2 cm mass.  On MRI there is a 1.9x1.7x1.3 mass in left breast without any other abnormalities.  This underwent a biopsy that shows invasive ductal carcinoma that is er/pr positive, her2 neu not amplified with Ki67 of 20%.  She has no complaints referable to her breasts right now.   She has a prior history of a lung carcinoid that was resected that she has done well from.  Past Medical History  Diagnosis Date  . Osteopenia   . Atrophic vaginitis   . Palpitations   . Heart defect     INTRAMYOCARDIAL BRIDGE  . Cancer 2009    lung  . Hypertension   . Anxiety   . Hot flashes     Past Surgical History  Procedure Laterality Date  . Lung removal, partial  2009    right upper lobe  . Vaginal hysterectomy  1974  . Cholecystectomy    . Appendectomy    . Dilation and curettage of uterus    . Tonsillectomy and adenoidectomy    . Thymectomy    . Lung biopsy  2009  . Cardiac catheterization  03/2010    Family History  Problem Relation Age of Onset  . Hypertension Mother   . Colon cancer Mother   . Breast cancer Mother     Age late 70's  . Heart disease Father     Social History History  Substance Use Topics  . Smoking status: Never Smoker   . Smokeless tobacco: Not on file  . Alcohol Use: 0.0 oz/week     Comment: rare    Allergies  Allergen Reactions  . Augmentin (Amoxicillin-Pot Clavulanate) Rash  . Codeine Rash    REACTION: hyperactivity, itching    Current Outpatient Prescriptions  Medication Sig Dispense Refill  . aspirin 81 MG chewable tablet Chew 81 mg by mouth  daily.        . atorvastatin (LIPITOR) 10 MG tablet Take 10 mg by mouth daily.        . BIOTIN PO Take 5,000 mg by mouth 2 (two) times a week.       . Calcium Carbonate-Vit D-Min (CALTRATE PLUS PO) Take 600 mg by mouth 2 (two) times daily.       . Estradiol (VAGIFEM) 10 MCG TABS vaginal tablet USE VAGINALLY 3 TIMES WEEKLY AS NEEDED & AS DIRECTED  16 tablet  9  . Eszopiclone (LUNESTA PO) Take by mouth.        . Multiple Vitamins-Minerals (CENTRUM PO) Take by mouth daily.        . potassium chloride (KLOR-CON) 10 MEQ CR tablet Take 20 mEq by mouth 2 (two) times daily.       . propranolol (INDERAL) 20 MG tablet Take 20 mg by mouth 2 (two) times daily.        . triamterene-hydrochlorothiazide (MAXZIDE) 75-50 MG per tablet Take 0.5 tablets by mouth daily.       . valsartan (DIOVAN) 80 MG tablet Take 80 mg by mouth daily.        .   verapamil (CALAN-SR) 180 MG CR tablet Take 180 mg by mouth at bedtime.       No current facility-administered medications for this visit.    Review of Systems Review of Systems  Constitutional: Negative for fever, chills and unexpected weight change.  HENT: Negative for hearing loss, congestion, sore throat, trouble swallowing and voice change.   Eyes: Negative for visual disturbance.  Respiratory: Positive for shortness of breath. Negative for cough and wheezing.   Cardiovascular: Negative for chest pain, palpitations and leg swelling.  Gastrointestinal: Negative for nausea, vomiting, abdominal pain, diarrhea, constipation, blood in stool, abdominal distention and anal bleeding.  Genitourinary: Negative for hematuria, vaginal bleeding and difficulty urinating.  Musculoskeletal: Negative for arthralgias.  Skin: Negative for rash and wound.  Neurological: Negative for seizures, syncope and headaches.  Hematological: Negative for adenopathy. Does not bruise/bleed easily.  Psychiatric/Behavioral: Negative for confusion.    There were no vitals taken for this  visit.  Physical Exam Physical Exam  Vitals reviewed. Constitutional: She appears well-developed and well-nourished.  Eyes: No scleral icterus.  Neck: Neck supple.  Cardiovascular: Normal rate, regular rhythm and normal heart sounds.   Pulmonary/Chest: Effort normal and breath sounds normal. She has no wheezes. She has no rales. Right breast exhibits no inverted nipple, no mass, no nipple discharge, no skin change and no tenderness. Left breast exhibits no inverted nipple, no mass, no nipple discharge, no skin change and no tenderness. Breasts are symmetrical.  Lymphadenopathy:    She has no cervical adenopathy.    She has no axillary adenopathy.       Right: No supraclavicular adenopathy present.       Left: No supraclavicular adenopathy present.    Data Reviewed BILATERAL BREAST MRI WITH AND WITHOUT CONTRAST  Technique: Multiplanar, multisequence MR images of both breasts  were obtained prior to and following the intravenous administration  of 14ml of MultiHance.  THREE-DIMENSIONAL MR IMAGE RENDERING ON INDEPENDENT WORKSTATION:  Three-dimensional MR images were rendered by post-processing of the  original MR data on an independent workstation. The three-  dimensional MR images were interpreted, and findings are reported  in the following complete MRI report for this study.  Comparison: Recent imaging examinations.  FINDINGS:  Breast composition: b. Scattered fibroglandular tissue  Background parenchymal enhancement: Moderate  Right breast: No mass or abnormal enhancement.  Left breast: 1.9 x 1.7 x 1.3 cm lobulated enhancing mass in the  central portion of the breast, posteriorly and slightly laterally.  This contains a biopsy tract as well as a biopsy marker clip  artifact at the posterior, superior aspect of the mass. No other  masses or areas of enhancement suspicious for malignancy in the  left breast.  Lymph nodes: No abnormal appearing lymph nodes.  Ancillary findings:  Small to moderate-sized hiatal hernia.  IMPRESSION:  1. 1.9 x 1.7 x 1.3 cm biopsy-proven invasive ductal carcinoma in  the left breast, as described above.  2. Small to moderate-sized hiatal hernia.  3. Otherwise, unremarkable examination.   Assessment    Left breast cancer, central, clinical stage I    Plan    Left breast wire guided lumpectomy, left axillary sentinel node biopsy I will plan on keeping her overnight after surgery. We discussed the staging and pathophysiology of breast cancer. We discussed all of the different options for treatment for breast cancer including surgery, chemotherapy, radiation therapy, Herceptin, and antiestrogen therapy.   We discussed a sentinel lymph node biopsy as she does not appear to having   lymph node involvement right now. We discussed the performance of that with injection of radioactive tracer and blue dye. We discussed that she would have an incision underneath her axillary hairline. We discussed that there is a bout a 10-20% chance of having a positive node with a sentinel lymph node biopsy and we will await the permanent pathology to make any other first further decisions in terms of her treatment. One of these options might be to return to the operating room to perform an axillary lymph node dissection. We discussed about a 1-2% risk lifetime of chronic shoulder pain as well as lymphedema associated with a sentinel lymph node biopsy.  We discussed the options for treatment of the breast cancer which included lumpectomy versus a mastectomy. We discussed the performance of the lumpectomy with a wire placement. We discussed a 10-20% chance of a positive margin requiring reexcision in the operating room. We also discussed that she may need radiation therapy or antiestrogen therapy or both if she undergoes lumpectomy. We discussed the mastectomy and the postoperative care for that as well. We discussed that there is no difference in her survival whether she  undergoes lumpectomy with radiation therapy or antiestrogen therapy versus a mastectomy. There is a slight difference in the local recurrence rate being 3-5% with lumpectomy and about 1% with a mastectomy. We discussed the risks of operation including bleeding, infection, possible reoperation. She understands her further therapy will be based on what her stages at the time of her operation.          Shakiera Edelson 11/04/2012, 12:16 PM    

## 2012-11-18 NOTE — Preoperative (Addendum)
Beta Blockers   Inderal 20 mgs taken on 11-18-12  @ 7:30

## 2012-11-18 NOTE — Interval H&P Note (Signed)
History and Physical Interval Note:  11/18/2012 1:44 PM  Teresa Chapman  has presented today for surgery, with the diagnosis of left breast cancer  The various methods of treatment have been discussed with the patient and family. After consideration of risks, benefits and other options for treatment, the patient has consented to  Procedure(s): LEFT BREAST LUMPECTOMY WITH NEEDLE LOCALIZATION AND AXILLARY SENTINEL LYMPH NODE BX (Left) as a surgical intervention .  The patient's history has been reviewed, patient examined, no change in status, stable for surgery.  I have reviewed the patient's chart and labs.  Questions were answered to the patient's satisfaction.     Rony Ratz

## 2012-11-18 NOTE — Progress Notes (Signed)
Spoke with Dr. Dwain Sarna re patient allergic to augmentin causing penicillin.  Dr.Wakefield still wants ancef given as ordered.

## 2012-11-18 NOTE — Anesthesia Procedure Notes (Signed)
Procedure Name: LMA Insertion Date/Time: 11/18/2012 2:01 PM Performed by: Tyrone Nine Pre-anesthesia Checklist: Patient identified, Timeout performed, Emergency Drugs available, Suction available and Patient being monitored Patient Re-evaluated:Patient Re-evaluated prior to inductionOxygen Delivery Method: Circle system utilized Preoxygenation: Pre-oxygenation with 100% oxygen Intubation Type: IV induction Ventilation: Mask ventilation without difficulty LMA: LMA with gastric port inserted LMA Size: 4.0 Placement Confirmation: positive ETCO2 Tube secured with: Tape Dental Injury: Teeth and Oropharynx as per pre-operative assessment

## 2012-11-18 NOTE — Anesthesia Preprocedure Evaluation (Addendum)
Anesthesia Evaluation  Patient identified by MRN, date of birth, ID band Patient awake    History of Anesthesia Complications (+) PONV  Airway Mallampati: II TM Distance: >3 FB Neck ROM: Full    Dental  (+) Teeth Intact, Dental Advisory Given and Caps   Pulmonary shortness of breath and with exertion,  breath sounds clear to auscultation        Cardiovascular hypertension, Pt. on medications and Pt. on home beta blockers Rhythm:Regular Rate:Normal     Neuro/Psych  Headaches, Anxiety    GI/Hepatic negative GI ROS,   Endo/Other  negative endocrine ROS  Renal/GU negative Renal ROS     Musculoskeletal   Abdominal   Peds  Hematology   Anesthesia Other Findings   Reproductive/Obstetrics                         Anesthesia Physical Anesthesia Plan  ASA: III  Anesthesia Plan: General   Post-op Pain Management:    Induction: Intravenous  Airway Management Planned: LMA  Additional Equipment:   Intra-op Plan:   Post-operative Plan: Extubation in OR  Informed Consent: I have reviewed the patients History and Physical, chart, labs and discussed the procedure including the risks, benefits and alternatives for the proposed anesthesia with the patient or authorized representative who has indicated his/her understanding and acceptance.   Dental advisory given  Plan Discussed with: CRNA, Anesthesiologist and Surgeon  Anesthesia Plan Comments:         Anesthesia Quick Evaluation

## 2012-11-18 NOTE — Anesthesia Postprocedure Evaluation (Signed)
  Anesthesia Post-op Note  Patient: Teresa Chapman  Procedure(s) Performed: Procedure(s): LEFT BREAST LUMPECTOMY WITH NEEDLE LOCALIZATION AND AXILLARY SENTINEL LYMPH NODE BX (Left)  Patient Location: PACU  Anesthesia Type:General  Level of Consciousness: awake  Airway and Oxygen Therapy: Patient Spontanous Breathing  Post-op Pain: mild  Post-op Assessment: Post-op Vital signs reviewed  Post-op Vital Signs: Reviewed  Complications: No apparent anesthesia complications

## 2012-11-18 NOTE — Transfer of Care (Signed)
Immediate Anesthesia Transfer of Care Note  Patient: Teresa Chapman  Procedure(s) Performed: Procedure(s): LEFT BREAST LUMPECTOMY WITH NEEDLE LOCALIZATION AND AXILLARY SENTINEL LYMPH NODE BX (Left)  Patient Location: PACU  Anesthesia Type:General  Level of Consciousness: awake, alert , oriented and patient cooperative  Airway & Oxygen Therapy: Patient Spontanous Breathing and Patient connected to nasal cannula oxygen  Post-op Assessment: Report given to PACU RN and Post -op Vital signs reviewed and stable  Post vital signs: Reviewed and stable  Complications: No apparent anesthesia complications

## 2012-11-19 ENCOUNTER — Telehealth (INDEPENDENT_AMBULATORY_CARE_PROVIDER_SITE_OTHER): Payer: Self-pay

## 2012-11-19 MED ORDER — OXYCODONE HCL 5 MG PO TABS: 5.0000 mg | ORAL_TABLET | ORAL | Status: DC | PRN

## 2012-11-19 NOTE — Progress Notes (Signed)
  DC home with son, verbally understood DC instructions, no questions ask. 

## 2012-11-19 NOTE — Telephone Encounter (Signed)
The pt called to ask if she can sleep on the side of her surgery.  I told her this is ok.  I said having the pressure on it may feel better but if it hurts her she knows to stop.

## 2012-11-19 NOTE — Discharge Summary (Signed)
Physician Discharge Summary  Patient ID: Teresa Chapman MRN: [id number] DOB/AGE: [birth year] 72 y.o.  Admit date: 11/18/2012 Discharge date: 11/19/2012  Admission Diagnoses: Breast cancer, clinical stage I HTN  Discharge Diagnoses:  Active Problems:   * No active hospital problems. *   Discharged Condition: good  Hospital Course: 69 yof with left breast cancer underwent left breast lumpectomy/sn biopsy.  She did well and remained in hospital overnight.  She is doing well following day, tol diet, ambulating, voiding and will be discharged home.  Consults: None  Significant Diagnostic Studies: none  Treatments: surgery: left breast lumpectomy, left axillary sentinel node biopsy  Discharge Exam: Blood pressure 120/57, pulse 61, temperature 97.3 F (36.3 C), temperature source Oral, resp. rate 16, height 5\' 1"  (1.549 m), weight 177 lb 4 oz (80.4 kg), SpO2 97.00%.  Left breast incision clean and dry, soft Left axillary incision without hematoma.  Disposition:    Future Appointments Provider Department Dept Phone   12/07/2012 11:00 AM Emelia Loron, MD [facility] Surgery, [place] [phone]   12/14/2012 11:30 AM Lowella Dell, MD [facility] MEDICAL ONCOLOGY [phone]       Medication List         acetaminophen 500 MG tablet  Commonly known as:  TYLENOL  Take 1,000 mg by mouth every 6 (six) hours as needed for pain.     aspirin 81 MG chewable tablet  Chew 81 mg by mouth daily.     atorvastatin 10 MG tablet  Commonly known as:  LIPITOR  Take 10 mg by mouth daily.     BIOTIN PO  Take 5,000 mg by mouth 2 (two) times a week.     CALTRATE PLUS PO  Take 600 mg by mouth 2 (two) times daily.     CENTRUM PO  Take by mouth daily.     estradiol 25 MCG vaginal tablet  Commonly known as:  VAGIFEM  Place 25 mcg vaginally 2 (two) times a week.     oxyCODONE 5 MG immediate release tablet  Commonly known as:  Oxy IR/ROXICODONE  Take 1 tablet (5 mg  total) by mouth every 4 (four) hours as needed.     potassium chloride SA 20 MEQ tablet  Commonly known as:  K-DUR,KLOR-CON  Take 20 mEq by mouth 2 (two) times daily.     propranolol 20 MG tablet  Commonly known as:  INDERAL  Take 20 mg by mouth 2 (two) times daily.     triamterene-hydrochlorothiazide 75-50 MG per tablet  Commonly known as:  MAXZIDE  Take 0.5 tablets by mouth daily.     valsartan 80 MG tablet  Commonly known as:  DIOVAN  Take 80 mg by mouth daily.     verapamil 180 MG CR tablet  Commonly known as:  CALAN-SR  Take 180 mg by mouth at bedtime.           Follow-up Information   Follow up with [facility], MD In 2 weeks.   Contact information:   [address] [place] [address] [phone]       Signed: Emelia Loron 11/19/2012, 8:48 AM

## 2012-11-20 ENCOUNTER — Encounter (HOSPITAL_COMMUNITY): Payer: Self-pay | Admitting: General Surgery

## 2012-11-23 ENCOUNTER — Telehealth: Payer: Self-pay | Admitting: *Deleted

## 2012-11-23 NOTE — Telephone Encounter (Signed)
Called and spoke with patient to reschedule her appt from 12/14/12 to 12/23/12 at 130pm with Dr. Darnelle Catalan.  Explained to her that per her Medicare we have to wait 14 days before sending off her Oncotype.  Pt. Verbalized understanding.

## 2012-11-24 ENCOUNTER — Telehealth (INDEPENDENT_AMBULATORY_CARE_PROVIDER_SITE_OTHER): Payer: Self-pay | Admitting: General Surgery

## 2012-11-24 NOTE — Telephone Encounter (Signed)
Called patient to discuss path.  She is doing well except for some soreness.  Discussed nodes being negative, tumor larger than appeared by imaging with multiple positive margins present.  Discussed need for further surgery. Will also ask oncology if this changes chemo recommendation or would still pursue oncotype testing.

## 2012-11-24 NOTE — Telephone Encounter (Signed)
Spoke with pt and informed her that we have moved her appt up to 11/30/12 at 10:10 w/ arrival time 15 minutes prior.

## 2012-11-25 ENCOUNTER — Other Ambulatory Visit: Payer: Self-pay

## 2012-11-25 ENCOUNTER — Other Ambulatory Visit: Payer: Self-pay | Admitting: Oncology

## 2012-11-30 ENCOUNTER — Ambulatory Visit (INDEPENDENT_AMBULATORY_CARE_PROVIDER_SITE_OTHER): Payer: Medicare Other | Admitting: General Surgery

## 2012-11-30 ENCOUNTER — Encounter (INDEPENDENT_AMBULATORY_CARE_PROVIDER_SITE_OTHER): Payer: Self-pay | Admitting: General Surgery

## 2012-11-30 VITALS — BP 132/70 | HR 64 | Resp 16 | Ht 61.0 in | Wt 166.6 lb

## 2012-11-30 DIAGNOSIS — Z09 Encounter for follow-up examination after completed treatment for conditions other than malignant neoplasm: Secondary | ICD-10-CM

## 2012-11-30 NOTE — Progress Notes (Signed)
Subjective:     Patient ID: Teresa Chapman, female   DOB: 10/01/1940, 72 y.o.   MRN: [id number]  HPI This is a 72-year-old female who had a newly diagnosed left breast cancer that was less than 2 cm by MRI. I took her to perform a left breast lumpectomy with a left axillary sentinel lymph node biopsy. Her pathology and it showed a grade 1 invasive ductal carcinoma that measures 3.3 cm with multiple positive margins and included some ductal carcinoma in situ. Lymph nodes are all negative. She is doing well except for some pain underneath her left axilla. She comes back in today to discuss her pathology and person.  Review of Systems     Objective:   Physical Exam Healing left breast and left axillary incision without any evidence of infection    Assessment:     Status post left breast lumpectomy and axillary sentinel lymph node biopsy for stage II left breast cancer     Plan:     This area is considerably larger than it appeared on her preoperative modalities. This accounts for the positive margins as this was palpable in the operating room and I thought I all margins clear. This is not the case and she comes back in today to discuss her options. Her lymph nodes are negative. We discussed her options including re-excision lumpectomy which I told her this point would be about 15-20% positive margin rate due to the fact that this tumor is not picked up by current imaging modalities and I cannot promise  how much more is left. I think this is a reasonable option but she just needs understand that there certainly is a chance of a third surgery and found to be the case I would recommend a mastectomy. We also discussed a mastectomy. We discussed a reconstruction that could occur along with that as well. She would very much like to attempt another excision. I will plan on scheduling her as soon as possible.       

## 2012-12-03 ENCOUNTER — Institutional Professional Consult (permissible substitution): Payer: Medicare Other | Admitting: Radiation Oncology

## 2012-12-03 ENCOUNTER — Encounter (HOSPITAL_BASED_OUTPATIENT_CLINIC_OR_DEPARTMENT_OTHER): Payer: Self-pay | Admitting: *Deleted

## 2012-12-03 ENCOUNTER — Encounter: Payer: Self-pay | Admitting: *Deleted

## 2012-12-03 ENCOUNTER — Ambulatory Visit: Payer: Medicare Other

## 2012-12-03 ENCOUNTER — Other Ambulatory Visit: Payer: Self-pay | Admitting: Obstetrics and Gynecology

## 2012-12-03 NOTE — Progress Notes (Signed)
Pt for re-exc-had ekg and labs 7/14

## 2012-12-03 NOTE — Progress Notes (Unsigned)
Oncotype ordered 12/03/12. Requisition faxed to pathology and verified receipt.

## 2012-12-04 ENCOUNTER — Encounter: Payer: Self-pay | Admitting: *Deleted

## 2012-12-04 NOTE — Progress Notes (Signed)
Mailed after appt letter to pt. 

## 2012-12-07 ENCOUNTER — Encounter (INDEPENDENT_AMBULATORY_CARE_PROVIDER_SITE_OTHER): Payer: Medicare Other | Admitting: General Surgery

## 2012-12-10 ENCOUNTER — Encounter (HOSPITAL_BASED_OUTPATIENT_CLINIC_OR_DEPARTMENT_OTHER): Payer: Self-pay | Admitting: Anesthesiology

## 2012-12-10 ENCOUNTER — Encounter (HOSPITAL_BASED_OUTPATIENT_CLINIC_OR_DEPARTMENT_OTHER): Payer: Self-pay | Admitting: *Deleted

## 2012-12-10 ENCOUNTER — Ambulatory Visit (HOSPITAL_BASED_OUTPATIENT_CLINIC_OR_DEPARTMENT_OTHER)
Admission: RE | Admit: 2012-12-10 | Discharge: 2012-12-10 | Disposition: A | Discharge status: Home / Self Care | Payer: Medicare Other | Source: Ambulatory Visit | Attending: General Surgery | Admitting: General Surgery

## 2012-12-10 ENCOUNTER — Encounter (HOSPITAL_BASED_OUTPATIENT_CLINIC_OR_DEPARTMENT_OTHER)
Admission: RE | Disposition: A | Discharge status: Home / Self Care | Payer: Self-pay | Source: Ambulatory Visit | Attending: General Surgery

## 2012-12-10 ENCOUNTER — Ambulatory Visit (HOSPITAL_BASED_OUTPATIENT_CLINIC_OR_DEPARTMENT_OTHER): Payer: Medicare Other | Admitting: Anesthesiology

## 2012-12-10 DIAGNOSIS — Z85118 Personal history of other malignant neoplasm of bronchus and lung: Secondary | ICD-10-CM | POA: Diagnosis not present

## 2012-12-10 DIAGNOSIS — B1910 Unspecified viral hepatitis B without hepatic coma: Secondary | ICD-10-CM | POA: Diagnosis not present

## 2012-12-10 DIAGNOSIS — C50919 Malignant neoplasm of unspecified site of unspecified female breast: Secondary | ICD-10-CM | POA: Diagnosis not present

## 2012-12-10 DIAGNOSIS — I10 Essential (primary) hypertension: Secondary | ICD-10-CM | POA: Diagnosis not present

## 2012-12-10 DIAGNOSIS — D0590 Unspecified type of carcinoma in situ of unspecified breast: Secondary | ICD-10-CM | POA: Diagnosis not present

## 2012-12-10 DIAGNOSIS — F411 Generalized anxiety disorder: Secondary | ICD-10-CM | POA: Insufficient documentation

## 2012-12-10 HISTORY — PX: RE-EXCISION OF BREAST LUMPECTOMY: SHX6048

## 2012-12-10 HISTORY — DX: Presence of spectacles and contact lenses: Z97.3

## 2012-12-10 SURGERY — EXCISION, LESION, BREAST
Anesthesia: General | Site: Breast | Laterality: Left | Wound class: Clean

## 2012-12-10 MED ORDER — LIDOCAINE HCL (CARDIAC) 20 MG/ML IV SOLN
INTRAVENOUS | Status: DC | PRN
  Administered 2012-12-10: 60 mg via INTRAVENOUS

## 2012-12-10 MED ORDER — FENTANYL CITRATE 0.05 MG/ML IJ SOLN
INTRAMUSCULAR | Status: DC | PRN
  Administered 2012-12-10 (×2): 50 ug via INTRAVENOUS

## 2012-12-10 MED ORDER — ONDANSETRON HCL 4 MG/2ML IJ SOLN
INTRAMUSCULAR | Status: DC | PRN
  Administered 2012-12-10: 4 mg via INTRAVENOUS

## 2012-12-10 MED ORDER — PROPOFOL 10 MG/ML IV BOLUS
INTRAVENOUS | Status: DC | PRN
  Administered 2012-12-10: 150 mg via INTRAVENOUS

## 2012-12-10 MED ORDER — BUPIVACAINE HCL (PF) 0.25 % IJ SOLN
INTRAMUSCULAR | Status: DC | PRN
  Administered 2012-12-10: 20 mL

## 2012-12-10 MED ORDER — OXYCODONE HCL 5 MG PO TABS
5.0000 mg | ORAL_TABLET | ORAL | Status: DC | PRN
  Administered 2012-12-10: 5 mg via ORAL

## 2012-12-10 MED ORDER — LACTATED RINGERS IV SOLN
INTRAVENOUS | Status: DC
  Administered 2012-12-10: 07:00:00 via INTRAVENOUS

## 2012-12-10 MED ORDER — FENTANYL CITRATE 0.05 MG/ML IJ SOLN: 50.0000 ug | Freq: Once | INTRAMUSCULAR | Status: DC

## 2012-12-10 MED ORDER — CEFAZOLIN SODIUM-DEXTROSE 2-3 GM-% IV SOLR
2.0000 g | INTRAVENOUS | Status: AC
  Administered 2012-12-10: 2 g via INTRAVENOUS

## 2012-12-10 MED ORDER — MIDAZOLAM HCL 2 MG/2ML IJ SOLN: 1.0000 mg | INTRAMUSCULAR | Status: DC | PRN

## 2012-12-10 MED ORDER — OXYCODONE-ACETAMINOPHEN 10-325 MG PO TABS: 1.0000 | ORAL_TABLET | Freq: Four times a day (QID) | ORAL | Status: DC | PRN

## 2012-12-10 MED ORDER — PROMETHAZINE HCL 25 MG/ML IJ SOLN: 6.2500 mg | INTRAMUSCULAR | Status: DC | PRN

## 2012-12-10 MED ORDER — EPHEDRINE SULFATE 50 MG/ML IJ SOLN
INTRAMUSCULAR | Status: DC | PRN
  Administered 2012-12-10: 10 mg via INTRAVENOUS

## 2012-12-10 MED ORDER — HYDROMORPHONE HCL PF 1 MG/ML IJ SOLN
0.2500 mg | INTRAMUSCULAR | Status: DC | PRN
  Administered 2012-12-10 (×2): 0.5 mg via INTRAVENOUS

## 2012-12-10 MED ORDER — FENTANYL CITRATE 0.05 MG/ML IJ SOLN: 50.0000 ug | INTRAMUSCULAR | Status: DC | PRN

## 2012-12-10 MED ORDER — DEXAMETHASONE SODIUM PHOSPHATE 4 MG/ML IJ SOLN
INTRAMUSCULAR | Status: DC | PRN
  Administered 2012-12-10: 10 mg via INTRAVENOUS

## 2012-12-10 SURGICAL SUPPLY — 52 items
ADH SKN CLS APL DERMABOND .7 (GAUZE/BANDAGES/DRESSINGS)
APL SKNCLS STERI-STRIP NONHPOA (GAUZE/BANDAGES/DRESSINGS) ×1
APPLIER CLIP 9.375 MED OPEN (MISCELLANEOUS)
APR CLP MED 9.3 20 MLT OPN (MISCELLANEOUS)
BENZOIN TINCTURE PRP APPL 2/3 (GAUZE/BANDAGES/DRESSINGS) ×1 IMPLANT
BINDER BREAST LRG (GAUZE/BANDAGES/DRESSINGS) ×1 IMPLANT
BINDER BREAST MEDIUM (GAUZE/BANDAGES/DRESSINGS) IMPLANT
BINDER BREAST XLRG (GAUZE/BANDAGES/DRESSINGS) IMPLANT
BINDER BREAST XXLRG (GAUZE/BANDAGES/DRESSINGS) IMPLANT
BLADE SURG 15 STRL LF DISP TIS (BLADE) ×1 IMPLANT
BLADE SURG 15 STRL SS (BLADE) ×2
CANISTER SUCTION 1200CC (MISCELLANEOUS) ×2 IMPLANT
CHLORAPREP W/TINT 26ML (MISCELLANEOUS) ×2 IMPLANT
CLIP APPLIE 9.375 MED OPEN (MISCELLANEOUS) IMPLANT
CLOTH BEACON ORANGE TIMEOUT ST (SAFETY) ×2 IMPLANT
COVER MAYO STAND STRL (DRAPES) ×2 IMPLANT
COVER TABLE BACK 60X90 (DRAPES) ×2 IMPLANT
DECANTER SPIKE VIAL GLASS SM (MISCELLANEOUS) ×1 IMPLANT
DERMABOND ADVANCED (GAUZE/BANDAGES/DRESSINGS)
DERMABOND ADVANCED .7 DNX12 (GAUZE/BANDAGES/DRESSINGS) IMPLANT
DRAPE PED LAPAROTOMY (DRAPES) ×2 IMPLANT
DRSG TEGADERM 4X4.75 (GAUZE/BANDAGES/DRESSINGS) ×2 IMPLANT
ELECT COATED BLADE 2.86 ST (ELECTRODE) ×2 IMPLANT
ELECT REM PT RETURN 9FT ADLT (ELECTROSURGICAL) ×2
ELECTRODE REM PT RTRN 9FT ADLT (ELECTROSURGICAL) ×1 IMPLANT
GAUZE SPONGE 4X4 12PLY STRL LF (GAUZE/BANDAGES/DRESSINGS) ×2 IMPLANT
GLOVE BIO SURGEON STRL SZ7 (GLOVE) ×3 IMPLANT
GLOVE BIOGEL PI IND STRL 7.5 (GLOVE) ×1 IMPLANT
GLOVE BIOGEL PI INDICATOR 7.5 (GLOVE) ×2
GOWN PREVENTION PLUS XLARGE (GOWN DISPOSABLE) ×3 IMPLANT
NDL HYPO 25X1 1.5 SAFETY (NEEDLE) ×1 IMPLANT
NEEDLE HYPO 25X1 1.5 SAFETY (NEEDLE) ×2 IMPLANT
NS IRRIG 1000ML POUR BTL (IV SOLUTION) ×1 IMPLANT
PACK BASIN DAY SURGERY FS (CUSTOM PROCEDURE TRAY) ×2 IMPLANT
PENCIL BUTTON HOLSTER BLD 10FT (ELECTRODE) ×2 IMPLANT
SLEEVE SCD COMPRESS KNEE MED (MISCELLANEOUS) ×2 IMPLANT
SPONGE LAP 4X18 X RAY DECT (DISPOSABLE) ×2 IMPLANT
STRIP CLOSURE SKIN 1/2X4 (GAUZE/BANDAGES/DRESSINGS) ×1 IMPLANT
SUT MNCRL AB 3-0 PS2 18 (SUTURE) IMPLANT
SUT MNCRL AB 4-0 PS2 18 (SUTURE) ×1 IMPLANT
SUT SILK 2 0 SH (SUTURE) ×2 IMPLANT
SUT VIC AB 2-0 SH 27 (SUTURE) ×4
SUT VIC AB 2-0 SH 27XBRD (SUTURE) ×1 IMPLANT
SUT VIC AB 3-0 SH 27 (SUTURE) ×2
SUT VIC AB 3-0 SH 27X BRD (SUTURE) ×1 IMPLANT
SUT VIC AB 5-0 PS2 18 (SUTURE) IMPLANT
SUT VICRYL AB 3 0 TIES (SUTURE) IMPLANT
SYR CONTROL 10ML LL (SYRINGE) ×2 IMPLANT
TOWEL OR 17X24 6PK STRL BLUE (TOWEL DISPOSABLE) ×2 IMPLANT
TOWEL OR NON WOVEN STRL DISP B (DISPOSABLE) ×2 IMPLANT
TUBE CONNECTING 20X1/4 (TUBING) ×2 IMPLANT
YANKAUER SUCT BULB TIP NO VENT (SUCTIONS) ×2 IMPLANT

## 2012-12-10 NOTE — Anesthesia Preprocedure Evaluation (Signed)
Anesthesia Evaluation  Patient identified by MRN, date of birth, ID band Patient awake    Reviewed: Allergy & Precautions, H&P , NPO status , Patient's Chart, lab work & pertinent test results  History of Anesthesia Complications (+) PONV  Airway Mallampati: II TM Distance: >3 FB Neck ROM: Full    Dental  (+) Teeth Intact, Dental Advisory Given and Caps   Pulmonary shortness of breath and with exertion,  Lung ca hx breath sounds clear to auscultation        Cardiovascular hypertension, Pt. on medications and Pt. on home beta blockers Rhythm:Regular Rate:Normal     Neuro/Psych  Headaches, Anxiety    GI/Hepatic negative GI ROS, (+) Hepatitis -, B  Endo/Other  negative endocrine ROS  Renal/GU negative Renal ROS     Musculoskeletal   Abdominal   Peds  Hematology   Anesthesia Other Findings   Reproductive/Obstetrics                           Anesthesia Physical Anesthesia Plan  ASA: III  Anesthesia Plan: General   Post-op Pain Management:    Induction: Intravenous  Airway Management Planned: LMA  Additional Equipment:   Intra-op Plan:   Post-operative Plan: Extubation in OR  Informed Consent: I have reviewed the patients History and Physical, chart, labs and discussed the procedure including the risks, benefits and alternatives for the proposed anesthesia with the patient or authorized representative who has indicated his/her understanding and acceptance.     Plan Discussed with: CRNA and Surgeon  Anesthesia Plan Comments:         Anesthesia Quick Evaluation

## 2012-12-10 NOTE — Anesthesia Postprocedure Evaluation (Signed)
  Anesthesia Post-op Note  Patient: Teresa Chapman  Procedure(s) Performed: Procedure(s): RE-EXCISION OF BREAST LUMPECTOMY (Left)  Patient Location: PACU  Anesthesia Type:General  Level of Consciousness: awake and alert   Airway and Oxygen Therapy: Patient Spontanous Breathing  Post-op Pain: mild  Post-op Assessment: Post-op Vital signs reviewed, Patient's Cardiovascular Status Stable, Respiratory Function Stable, Patent Airway, No signs of Nausea or vomiting, Adequate PO intake and Pain level controlled  Post-op Vital Signs: stable  Complications: No apparent anesthesia complications

## 2012-12-10 NOTE — Op Note (Signed)
Preoperative diagnosis: Stage II left breast cancer status post lumpectomy and sentinel lymph node biopsy with positive margins Postoperative diagnosis: Same as above Procedure: Reexcision left breast lumpectomy Surgeon: Dr. Harden Mo Anesthesia: Gen. Estimated blood loss: Minimal Specimens: #1 new anterior margin #2 new posterior margin #3 new superior margin Complications: None Drains: None Sponge and needle count correct Disposition to recovery stable  Indications: This 72 year old female who had a left breast cancer diagnosed by imaging. On MRI this area appeared to be in the largest extent 1.9 cm. I took her to the operating room and performed a lumpectomy and sentinel lymph node biopsy. She has 4 sentinel lymph nodes that are negative. I excised some additional margins in the operating room that were both clear. These were the lateral and inferior margins. Her inferior margin did have some atypical ductal hyperplasia associated with it. Her tumor ends up being a grade 1 invasive ductal carcinoma that actually measures 3.3 cm with associated low-grade ductal carcinoma in situ. Invasive cancer is present at the posterior, anterior, lateral, and superior margins. The ductal carcinoma in situ is a less than 1 mm from the anterior margin. Her posterior, anterior, and superior margins are all still positive. We discussed her options now that this is a much larger tumor. We discussed an attempt at reexcision with the possibility of further surgery versus a mastectomy. She very much would like to try for reexcision.  Procedure: After informed consent was obtained the patient was taken to the operating room. She was administered 2 g of intravenous cefazolin. Sequential compression devices were on the legs. She was placed under general anesthesia without complication. Her left breast was prepped and draped in the standard sterile surgical fashion and a surgical timeout was then performed.  I  infiltrated Marcaine throughout the prior cavity. I then opened her old incision. I removed all the sutures that I had used to close her breast tissue. I then identified the location of all of my clips in the cavity. I reexcised her superior margin from the skin all the way down to the muscle and this included a portion of some tissue that was still remaining on the pectoralis muscle. This was then marked with a short stitch marking the superior margin. I then removed what tissue that was left at the posterior margin of the muscle. This was passed off the table and the double stitch marked the new deep margin. The deep margin is the pectoralis muscle. I then excised the anterior margin in a similar fashion and placed 2 stitches at the new anterior margin. I felt that I completely cleared these margins in the is no other palpable abnormality present at all. I then irrigated this. I obtained hemostasis. I then closed some of the deep breast tissue with 2-0 Vicryl. There was a larger cavity than normal due to the size of the excision. I then closed the dermis with 3-0 Vicryl and the skin with 4-0 Monocryl. I injected more Marcaine. I then closes with Steri-Strips and placed a sterile dressing. A breast binder was placed. She tolerated this well was extubated and transferred to recovery stable.

## 2012-12-10 NOTE — Anesthesia Procedure Notes (Signed)
Procedure Name: LMA Insertion Date/Time: 12/10/2012 7:36 AM Performed by: Gar Gibbon Pre-anesthesia Checklist: Patient identified, Emergency Drugs available, Suction available and Patient being monitored Patient Re-evaluated:Patient Re-evaluated prior to inductionOxygen Delivery Method: Circle System Utilized Preoxygenation: Pre-oxygenation with 100% oxygen Intubation Type: IV induction Ventilation: Mask ventilation without difficulty LMA: LMA inserted LMA Size: 4.0 Number of attempts: 1 Airway Equipment and Method: bite block Placement Confirmation: positive ETCO2 Tube secured with: Tape Dental Injury: Teeth and Oropharynx as per pre-operative assessment

## 2012-12-10 NOTE — Transfer of Care (Signed)
Immediate Anesthesia Transfer of Care Note  Patient: Teresa Chapman  Procedure(s) Performed: Procedure(s): RE-EXCISION OF BREAST LUMPECTOMY (Left)  Patient Location: PACU  Anesthesia Type:General  Level of Consciousness: awake, oriented and patient cooperative  Airway & Oxygen Therapy: Patient Spontanous Breathing and Patient connected to face mask oxygen  Post-op Assessment: Report given to PACU RN and Post -op Vital signs reviewed and stable  Post vital signs: Reviewed and stable  Complications: No apparent anesthesia complications

## 2012-12-10 NOTE — H&P (View-Only) (Signed)
Subjective:     Patient ID: Teresa Chapman, female   DOB: [birth year], 72 y.o.   MRN: [id number]  HPI This is a 72 year old female who had a newly diagnosed left breast cancer that was less than 2 cm by MRI. I took her to perform a left breast lumpectomy with a left axillary sentinel lymph node biopsy. Her pathology and it showed a grade 1 invasive ductal carcinoma that measures 3.3 cm with multiple positive margins and included some ductal carcinoma in situ. Lymph nodes are all negative. She is doing well except for some pain underneath her left axilla. She comes back in today to discuss her pathology and person.  Review of Systems     Objective:   Physical Exam Healing left breast and left axillary incision without any evidence of infection    Assessment:     Status post left breast lumpectomy and axillary sentinel lymph node biopsy for stage II left breast cancer     Plan:     This area is considerably larger than it appeared on her preoperative modalities. This accounts for the positive margins as this was palpable in the operating room and I thought I all margins clear. This is not the case and she comes back in today to discuss her options. Her lymph nodes are negative. We discussed her options including re-excision lumpectomy which I told her this point would be about 15-20% positive margin rate due to the fact that this tumor is not picked up by current imaging modalities and I cannot promise  how much more is left. I think this is a reasonable option but she just needs understand that there certainly is a chance of a third surgery and found to be the case I would recommend a mastectomy. We also discussed a mastectomy. We discussed a reconstruction that could occur along with that as well. She would very much like to attempt another excision. I will plan on scheduling her as soon as possible.

## 2012-12-10 NOTE — Interval H&P Note (Signed)
History and Physical Interval Note:  12/10/2012 7:22 AM  Teresa Chapman  has presented today for surgery, with the diagnosis of breast cancer  The various methods of treatment have been discussed with the patient and family. After consideration of risks, benefits and other options for treatment, the patient has consented to  Procedure(s): RE-EXCISION OF BREAST LUMPECTOMY (Left) as a surgical intervention .  The patient's history has been reviewed, patient examined, no change in status, stable for surgery.  I have reviewed the patient's chart and labs.  Questions were answered to the patient's satisfaction.     Geneva Pallas

## 2012-12-11 ENCOUNTER — Encounter (HOSPITAL_BASED_OUTPATIENT_CLINIC_OR_DEPARTMENT_OTHER): Payer: Self-pay | Admitting: General Surgery

## 2012-12-11 ENCOUNTER — Encounter: Payer: Self-pay | Admitting: *Deleted

## 2012-12-11 NOTE — Progress Notes (Signed)
Oncotype score- 21 - 13%.  Copy given to Dr. Darnelle Catalan.  Original given to HIM to scan into MR.

## 2012-12-14 ENCOUNTER — Ambulatory Visit: Payer: Medicare Other | Admitting: Oncology

## 2012-12-23 ENCOUNTER — Ambulatory Visit (HOSPITAL_BASED_OUTPATIENT_CLINIC_OR_DEPARTMENT_OTHER): Payer: Medicare Other | Admitting: Oncology

## 2012-12-23 ENCOUNTER — Telehealth: Payer: Self-pay | Admitting: Oncology

## 2012-12-23 VITALS — BP 159/70 | HR 66 | Temp 98.3°F | Resp 20 | Ht 63.0 in | Wt 166.4 lb

## 2012-12-23 DIAGNOSIS — Z170 Estrogen receptor positive status [ER+]: Secondary | ICD-10-CM | POA: Diagnosis not present

## 2012-12-23 DIAGNOSIS — C50212 Malignant neoplasm of upper-inner quadrant of left female breast: Secondary | ICD-10-CM | POA: Insufficient documentation

## 2012-12-23 DIAGNOSIS — C50219 Malignant neoplasm of upper-inner quadrant of unspecified female breast: Secondary | ICD-10-CM

## 2012-12-23 NOTE — Progress Notes (Signed)
Patient ID: Teresa Chapman, female   DOB: [birth year], 72 y.o.   MRN: [id number] ID: Sharlynn B Nazaryan OB: 03/15/1941  MR#: [id number]  NWG#:[id number]  PCP: Garlan Fillers, MD GYN:  Colin Broach SU: Emelia Loron OTHER MD: Lurline Hare, Karlyn Agee   HISTORY OF PRESENT ILLNESS: Mindel had bilateral screening mammography at the [facility] Center 10/06/2012 showing a possible mass in the left breast. Additional views showed that the right breast mass was a simple cyst. However spot compression views on the left showed an area of increased density in the upper inner quadrant. There was no palpable mass on physical exam. Ultrasound however confirmed a hypoechoic irregular mass in the left breast measuring 1.3 cm. Biopsy of this mass 10/28/2012 showed an invasive ductal carcinoma, grade 2, estrogen receptor 90% positive, progesterone receptor 10% positive, with an MIB-1 of 20% and no HER-2 amplification.  On 11/03/2012 the patient underwent bilateral breast MRI this showed only the left breast mass in question, which measured 1.9 cm on this modality. There were no abnormal appearing lymph nodes.  The patient's subsequent history is as detailed below  INTERVAL HISTORY: Baker returns today for followup of her breast cancer accompanied by her husband Ray. Since her last visit here she had her definitive surgery and then further resection for margin clearance.  REVIEW OF SYSTEMS: She still a bit sore from the surgery, but otherwise tolerated it well, with no unusual fever, swelling, erythema, or bleeding. She is sleeping poorly because she is a bit anxious associated with this diagnosis and particularly the question whether she might need to have chemotherapy. Otherwise a detailed review of systems today was stable  PAST MEDICAL HISTORY: Past Medical History  Diagnosis Date  . Osteopenia   . Atrophic vaginitis   . Hot flashes   . Hyperlipidemia     takes Atorvastatin daily  . Hypertension     takes  Inderal,Maxzide,and Diovan daily  . PONV (postoperative nausea and vomiting)   . Myocardial bridge     intra  . History of bronchitis 04/23/2012  . Arthritis     right wrist and finger joints  . Scoliosis   . Osteoarthritis   . History of colon polyps   . Diverticulosis   . Urinary frequency   . Urinary urgency   . Stress incontinence   . History of blood transfusion 1971    "when my son was born"  (11/18/2012); no abnormal reaction noted  . Cataracts, bilateral     immature  . Insomnia     infreqently takes Lunesta  . History of shingles 40yrs ago and in 2013  . Lung cancer 2009  . Breast cancer, left breast 10/2012  . Basal cell cancer     "right face; RLE" (11/18/2012)  . Squamous cell carcinoma     "abdomen" (11/18/2012)  . DVT (deep venous thrombosis)     "? right side; years and years ago" (11/18/2012)  . Exertional shortness of breath   . Hepatitis B 1970's  . Anxiety     "related to breast cancer; totally unexpected" (11/18/2012)  . Wears glasses   . Dysrhythmia     hx pvc    PAST SURGICAL HISTORY: Past Surgical History  Procedure Laterality Date  . Lung removal, partial Right 2009    upper lobe  . Vaginal hysterectomy  1974  . Cholecystectomy    . Appendectomy    . Dilation and curettage of uterus    . Tonsillectomy and adenoidectomy    .  Thymectomy  ~ 1994  . Lung biopsy Right 2009  . Colonoscopy      "I've had a few; last one was 2010" (11/18/2012)  . Cardiac catheterization  03/2010  . Basal cell carcinoma excision      "right face; RLE" (11/18/2012)  . Squamous cell carcinoma excision      "?abdomen" (11/18/2012)  . Breast biopsy Right ~ 1962    "benign" (11/18/2012)  . Breast biopsy Right 10/2012    "benign cyst" (11/18/2012)  . Breast biopsy Left 10/2012  . Breast lumpectomy Left 11/18/2012    "w/sentinel node bx" (11/18/2012)  . Breast lumpectomy with needle localization and axillary sentinel lymph node bx Left 11/18/2012    Procedure: LEFT BREAST  LUMPECTOMY WITH NEEDLE LOCALIZATION AND AXILLARY SENTINEL LYMPH NODE BX;  Surgeon: Emelia Loron, MD;  Location: [facility] OR;  Service: General;  Laterality: Left;  . Re-excision of breast lumpectomy Left 12/10/2012    Procedure: RE-EXCISION OF BREAST LUMPECTOMY;  Surgeon: Emelia Loron, MD;  Location: [facility];  Service: General;  Laterality: Left;    FAMILY HISTORY Family History  Problem Relation Age of Onset  . Hypertension Mother   . Colon cancer Mother   . Breast cancer Mother     Age late 61's  . Heart disease Father    the patient's father died at the age of 9 from a myocardial infarction. The patient's mother died at the age of 39 from Alzheimer's disease. She had been diagnosed with colon cancer at the age of 70 and breast cancer at the age of 45. The patient had one brother who died at birth. No sisters. There is no history of ovarian cancer in the family.  GYNECOLOGIC HISTORY:  Menarche age 62, menopause in her early 40s. She took hormone replacement approximately 10 years. The patient is GX P2, with first live birth at age 37.  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 Remonia Richter. Her husband [name] "Ray" anxious is a retired Human resources officer. Son Amena Dockham lives in [place] where he works in Consulting civil engineer. [name] lives in [place] where he is a Transport planner. The patient has 3 grandchildren. She attends the [employer]     ADVANCED DIRECTIVES: In place   HEALTH MAINTENANCE: History  Substance Use Topics  . Smoking status: Never Smoker   . Smokeless tobacco: Never Used  . Alcohol Use: 0.0 oz/week     Comment: 11/18/2012 "glass of wine twice/month"     Colonoscopy:  PAP:  Bone density:  Lipid panel:  Allergies  Allergen Reactions  . Augmentin [Amoxicillin-Pot Clavulanate] Rash  . Codeine Rash    REACTION: hyperactivity, itching     Current Outpatient Prescriptions  Medication Sig Dispense Refill  . acetaminophen (TYLENOL) 500 MG tablet Take 1,000 mg by mouth every 6 (six) hours as needed for pain.      Marland Kitchen aspirin 81 MG tablet Take 81 mg by mouth daily.      Marland Kitchen atorvastatin (LIPITOR) 10 MG tablet Take 10 mg by mouth daily.        Marland Kitchen BIOTIN PO Take 5,000 mg by mouth 2 (two) times a week.       . Calcium Carbonate-Vit D-Min (CALTRATE PLUS PO) Take 600 mg by mouth 2 (two) times daily.       Marland Kitchen HYDROcodone-acetaminophen (VICODIN) 5-500 MG per tablet Take 1 tablet  by mouth every 6 (six) hours as needed for pain.      . Multiple Vitamins-Minerals (CENTRUM PO) Take by mouth daily.        Marland Kitchen oxyCODONE-acetaminophen (PERCOCET) 10-325 MG per tablet Take 1 tablet by mouth every 6 (six) hours as needed for pain.  20 tablet  0  . potassium chloride SA (K-DUR,KLOR-CON) 20 MEQ tablet Take 20 mEq by mouth 2 (two) times daily.      . propranolol (INDERAL) 20 MG tablet Take 20 mg by mouth 2 (two) times daily.       Marland Kitchen triamterene-hydrochlorothiazide (MAXZIDE) 75-50 MG per tablet Take 0.5 tablets by mouth daily.       Marland Kitchen VAGIFEM 10 MCG TABS vaginal tablet USE VAGINALLY 3 TIMES WEEKLY AS NEEDED & AS DIRECTED  16 tablet  7  . valsartan (DIOVAN) 80 MG tablet Take 80 mg by mouth daily.        . verapamil (CALAN-SR) 180 MG CR tablet Take 180 mg by mouth at bedtime.       No current facility-administered medications for this visit.    OBJECTIVE: Older white woman who appears stated age 11 Vitals:   12/23/12 1311  BP: 159/70  Pulse: 66  Temp: 98.3 F (36.8 C)  Resp: 20     Body mass index is 29.48 kg/(m^2).    ECOG FS: 1  Sclerae unicteric, pupils equal round and reactive Oropharynx clear No cervical or supraclavicular adenopathy Lungs no rales or rhonchi Heart regular rate and rhythm Abd soft, nontender, positive bowel sounds MSK no focal spinal tenderness, no peripheral edema Neuro: non-focal, well-oriented, appropriate  affect Breasts: The right breast is unremarkable. The left breast is status post recent lumpectomy. Steri-Strips are still in place. There is no evidence of dehiscence, erythema, swelling, or unusual tenderness. The cosmetic result is good. The left axilla is benign.   LAB RESULTS:  CMP     Component Value Date/Time   NA 140 11/04/2012 0821   NA 139 10/24/2007 0440   K 3.8 11/04/2012 0821   K 3.5 10/24/2007 0440   CL 102 10/24/2007 0440   CO2 29 11/04/2012 0821   CO2 27 10/24/2007 0440   GLUCOSE 83 11/04/2012 0821   GLUCOSE 100* 10/24/2007 0440   BUN 18.8 11/04/2012 0821   BUN 17 10/24/2007 0440   CREATININE 0.9 11/04/2012 0821   CREATININE 1.03 10/24/2007 0440   CALCIUM 9.8 11/04/2012 0821   CALCIUM 8.8 10/24/2007 0440   PROT 7.1 11/04/2012 0821   PROT 5.7* 10/21/2007 0345   ALBUMIN 3.8 11/04/2012 0821   ALBUMIN 3.1* 10/21/2007 0345   AST 22 11/04/2012 0821   AST 29 10/21/2007 0345   ALT 24 11/04/2012 0821   ALT 20 10/21/2007 0345   ALKPHOS 98 11/04/2012 0821   ALKPHOS 65 10/21/2007 0345   BILITOT 0.46 11/04/2012 0821   BILITOT 0.6 10/21/2007 0345   GFRNONAA 53* 10/24/2007 0440   GFRAA  Value: >60        The eGFR has been calculated using the MDRD equation. This calculation has not been validated in all clinical 10/24/2007 0440    I No results found for this basename: SPEP,  UPEP,   kappa and lambda light chains    Lab Results  Component Value Date   WBC 7.0 11/04/2012   NEUTROABS 3.9 11/04/2012   HGB 13.1 11/04/2012   HCT 38.5 11/04/2012   MCV 87.3 11/04/2012   PLT 229 11/04/2012      Chemistry  Component Value Date/Time   NA 140 11/04/2012 0821   NA 139 10/24/2007 0440   K 3.8 11/04/2012 0821   K 3.5 10/24/2007 0440   CL 102 10/24/2007 0440   CO2 29 11/04/2012 0821   CO2 27 10/24/2007 0440   BUN 18.8 11/04/2012 0821   BUN 17 10/24/2007 0440   CREATININE 0.9 11/04/2012 0821   CREATININE 1.03 10/24/2007 0440      Component Value Date/Time   CALCIUM 9.8 11/04/2012 0821   CALCIUM 8.8 10/24/2007 0440   ALKPHOS 98  11/04/2012 0821   ALKPHOS 65 10/21/2007 0345   AST 22 11/04/2012 0821   AST 29 10/21/2007 0345   ALT 24 11/04/2012 0821   ALT 20 10/21/2007 0345   BILITOT 0.46 11/04/2012 0821   BILITOT 0.6 10/21/2007 0345       No results found for this basename: LABCA2    No components found with this basename: LABCA125    No results found for this basename: INR,  in the last 168 hours  Urinalysis    Component Value Date/Time   COLORURINE YELLOW 10/15/2011 1000   APPEARANCEUR CLEAR 10/15/2011 1000   LABSPEC 1.014 10/15/2011 1000   PHURINE 7.5 10/15/2011 1000   GLUCOSEU NEG 10/15/2011 1000   HGBUR NEG 10/15/2011 1000   BILIRUBINUR NEG 10/15/2011 1000   KETONESUR NEG 10/15/2011 1000   PROTEINUR NEG 10/15/2011 1000   UROBILINOGEN 1 10/15/2011 1000   NITRITE NEG 10/15/2011 1000   LEUKOCYTESUR NEG 10/15/2011 1000    STUDIES: No results found.  ASSESSMENT: 72 y.o. [place] woman status post upper inner quadrant left breast biopsy 10/28/2012 for a clinical T1 N0, stage IA invasive ductal carcinoma, grade 2, estrogen receptor 90% positive, progesterone receptor 10% positive, with no HER-2 amplification, and an MIB-1 of 20%  (1) Oncotype DX score of 21 predicts a distant recurrence risk within 10 years of 13% if the patient's only systemic treatment is tamoxifen for 5 years  (2) status post left lumpectomy and sentinel lymph node sampling in 11/18/2012 for a P. T2 BN0, stage II a invasive ductal carcinoma, grade 1,   (3) status post margin clearance 12/10/2012 with a microscopic focus of invasive ductal carcinoma found at adjacent to the prior biopsy cavity, and DCIS within 1 mm of the final margin   PLAN: Tequilla is recovering nicely from her surgeries. We discussed her Oncotype results, and she understands that we can do better than 5 years of tamoxifen, namely for example 5 years of an aromatase inhibitor. That would further reduce her risk of recurrence by about 3%.  This cancer benefit from chemotherapy it  might be in the 3% range. In terms of means of 100 women like her 41 would get no benefit at all from chemotherapy.  Accordingly I am very comfortable with her decision not to receive chemotherapy in she will proceed directly to radiation at this point. I am going to see her early November and at that time we will decide whether she should go on tamoxifen or an aromatase inhibitor. She understands that if she does choose an aromatase inhibitor she will not be able to continue to use Vagifem suppositories. If she chooses tamoxifen we do have to worry some about the risk of clots.  She knows to call for any problems that may develop before next visit here.  Lowella Dell, MD   12/23/2012 1:21 PM

## 2012-12-23 NOTE — Addendum Note (Signed)
Addended by: Lorenza Evangelist A on: 12/23/2012 04:27 PM   Modules accepted: Orders

## 2012-12-25 ENCOUNTER — Encounter (INDEPENDENT_AMBULATORY_CARE_PROVIDER_SITE_OTHER): Payer: Self-pay | Admitting: General Surgery

## 2012-12-25 ENCOUNTER — Ambulatory Visit (INDEPENDENT_AMBULATORY_CARE_PROVIDER_SITE_OTHER): Payer: Medicare Other | Admitting: General Surgery

## 2012-12-25 VITALS — BP 126/76 | HR 65 | Temp 97.8°F | Resp 18 | Ht 61.0 in | Wt 166.0 lb

## 2012-12-25 DIAGNOSIS — Z09 Encounter for follow-up examination after completed treatment for conditions other than malignant neoplasm: Secondary | ICD-10-CM

## 2012-12-25 NOTE — Progress Notes (Signed)
Subjective:     Patient ID: Teresa Chapman, female   DOB: 12/25/1940, 72 y.o.   MRN: [id number]  HPI 23 yof s/p left lumpectomy/snbx with multiple positive margins who underwent re-excision lumpectomy where margins are now negative.  The anterior margin (which is skin) has dcis focally 1 mm away.  She has done well since surgery and only complaints of some pulling and abnormal sensation on inner aspect of left arm.  She has seen Dr Darnelle Catalan and plans for xrt and antiestrogen therapy.   Review of Systems     Objective:   Physical Exam Healing left breast incision and left axillary incision without infection    Assessment:     Stage II left breast cancer     Plan:     I have referred her to the after breast cancer physical therapy class. She is released to full activity and another 2 weeks. She is going to see radiation oncology next week. We discussed the plan for radiation followed by antiestrogen therapy. I will plan on seeing her back in 6 months or sooner if she has any questions. We discussed her pathology that the cancer is entirely removed  now. We discussed the need for adjuvant therapy to both reduce local and distant recurrence.

## 2012-12-25 NOTE — Patient Instructions (Signed)
      ABC CLASS After Breast Cancer Class  After Breast Cancer Class is a specially designed exercise class to assist you in a safe recovery after having breast cancer surgery.  In this class you will learn how to get back to full function whether your drains were just removed or if you had surgery a month ago.  This one-time class is held the 1st and 3rd Monday of every month from 11:00 a.m. until 12:00 noon at the [facility] located at [address]  This class is FREE and space is limited.  For more information or to register for the next available class, call [phone].  Class Goals   Understand specific stretches to improve the flexibility of your chest and shoulder.   Learn ways to safely strengthen your upper body and improve your posture.   Understand the warning signs of infection and why you may be at risk for an arm infection.   Learn about Lymphedema and prevention.  **You do not attend this class until after surgery.  Drains must be removed to participate.     Donna Salisbury, PT, CLT Marti Smith, PT, CLT        

## 2012-12-29 ENCOUNTER — Encounter: Payer: Self-pay | Admitting: Radiation Oncology

## 2012-12-29 NOTE — Progress Notes (Signed)
Location of Breast Cancer: left, upper inner, 10 o'clock  Histology per Pathology Report:  11/18/12 1. Breast, lumpectomy, Left - INVASIVE DUCTAL CARCINOMA, GRADE I/III, SPANNING 3.3 CM. - DUCTAL CARCINOMA IN SITU WITH CALCIFICATIONS, LOW GRADE. - LOBULAR NEOPLASIA (ATYPICAL LOBULAR HYPERPLASIA). - INVASIVE CARCINOMA IS PRESENT AT THE POSTERIOR, ANTERIOR, LATERAL, AND SUPERIOR MARGINS OF SPECIMEN #1. - DUCTAL CARCINOMA IN SITU IS LESS THAN 0.1 CM FROM THE ANTERIOR MARGIN OF SPECIMEN #1. - SEE ONCOLOGY TABLE BELOW. 2. Lymph node, sentinel, biopsy, Left axillary #1 - THERE IS NO EVIDENCE OF CARCINOMA IN 2 OF 2 LYMPH NODES (0/2). 3. Breast, excision, Additional lateral margin left - BENIGN BREAST PARENCHYMA. - THERE IS NO EVIDENCE OF MALIGNANCY. - SEE COMMENT. 4. Breast, excision, Additional inferior margin left - ATYPICAL DUCTAL HYPERPLASIA. - SEE COMMENT. 5. Lymph node, sentinel, biopsy, left sentinel # 2 - THERE IS NO EVIDENCE OF CARCINOMA IN 1 OF 1 LYMPH NODE (0/1). 6. Lymph node, sentinel, biopsy, Left sentinel #3 - THERE IS NO EVIDENCE OF CARCINOMA IN 1 OF 1 LYMPH NODE (0/1).  Receptor Status: ER(100%), PR (7%), Her2-neu (-)  Did patient present with symptoms (if so, please note symptoms) or was this found on screening mammography?: screening mammogram  Past/Anticipated interventions by surgeon, if any: left lumpectomy, 11/18/12   Re-excision, 12/10/12 Diagnosis 1. Breast, excision, Left superior margin - INFLAMED GRANULATION TISSUE, GIANT CELL REACTION AND FIBROSIS CONSISTENT WITH BIOPSY CAVITY. - NO RESIDUAL CARCINOMA IDENTIFIED. - FINAL MARGIN CLEAR. 2. Breast, excision, Left new posterior margin - INFLAMED GRANULATION TISSUE, GIANT CELL REACTION AND FIBROSIS CONSISTENT WITH BIOPSY CAVITY. - NO RESIDUAL CARCINOMA IDENTIFIED. - FINAL MARGIN CLEAR. 3. Breast, excision, Left new anterior margin - MICROSCOPIC FOCUS OF INVASIVE DUCTAL CARCINOMA ASSOCIATED WITH  DUCTAL CARCINOMA IN SITU. - DUCTAL CARCINOMA IN SITU FOCALLY 0.1 CM FROM FINAL MARGIN. - MICROSCOPIC FOCUS OF INVASIVE CARCINOMA 0.8 CM FROM FINAL MARGIN.  Past/Anticipated interventions by medical oncology, if any: pt decided not to pursue chemotherapy , next appt w/Dr Magrinat  02/23/13 to decide on Tamoxifen  Lymphedema issues, if any:  None today, she states she may have had slight edema of upper left arm after surgery  Pain issues, if any: pt reports post op "soreness" of left breast  SAFETY ISSUES:  Prior radiation? no  Pacemaker/ICD? no  Possible current pregnancy? no  Is the patient on methotrexate? 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

## 2012-12-30 ENCOUNTER — Ambulatory Visit
Admission: RE | Admit: 2012-12-30 | Discharge: 2012-12-30 | Disposition: A | Discharge status: Home / Self Care | Payer: Medicare Other | Source: Ambulatory Visit | Attending: Radiation Oncology | Admitting: Radiation Oncology

## 2012-12-30 ENCOUNTER — Encounter: Payer: Self-pay | Admitting: Radiation Oncology

## 2012-12-30 VITALS — BP 132/74 | HR 57 | Temp 98.6°F | Resp 20 | Wt 166.6 lb

## 2012-12-30 DIAGNOSIS — C50919 Malignant neoplasm of unspecified site of unspecified female breast: Secondary | ICD-10-CM | POA: Diagnosis not present

## 2012-12-30 DIAGNOSIS — Z170 Estrogen receptor positive status [ER+]: Secondary | ICD-10-CM | POA: Insufficient documentation

## 2012-12-30 DIAGNOSIS — C50212 Malignant neoplasm of upper-inner quadrant of left female breast: Secondary | ICD-10-CM

## 2012-12-30 DIAGNOSIS — C50219 Malignant neoplasm of upper-inner quadrant of unspecified female breast: Secondary | ICD-10-CM | POA: Diagnosis not present

## 2012-12-30 NOTE — Progress Notes (Signed)
Please see the Nurse Progress Note in the MD Initial Consult Encounter for this patient. 

## 2012-12-31 NOTE — Progress Notes (Signed)
Department of Radiation Oncology  Phone:  [phone] Fax:        [phone]   Name: Teresa Chapman MRN: [id number]  DOB: July 19, 1940  Date: 12/30/2012  Follow Up Visit Note  Diagnosis: T2 N0 invasive ductal carcinoma of the left breast ER/PR positive HER-2 negative  Interval History: Teresa Chapman presents today for routine followup.  She had her lumpectomy on Lorrie 30. A 3.3 cm invasive ductal carcinoma was noted with associated low-grade ductal carcinoma in situ. Multiple positive margins were noted. No carcinoma was seen in 2 sentinel lymph nodes. She underwent reexcision on 12/10/2012 which showed a microscopic focus of invasive ductal carcinoma at the anterior margin. DCIS was focally 0.1 cm from the final margin. She is healed up well from her surgery. She's not taking any pain medications. She is surprised that her Steri-Strips are still in place. She has discussed antiestrogen therapy with Dr. Darnelle Catalan. She is ready to proceed on with radiation.  Allergies:  Allergies  Allergen Reactions  . Augmentin [Amoxicillin-Pot Clavulanate] Rash  . Codeine Rash    REACTION: hyperactivity, itching    Medications:  Current Outpatient Prescriptions  Medication Sig Dispense Refill  . acetaminophen (TYLENOL) 500 MG tablet Take 1,000 mg by mouth every 6 (six) hours as needed for pain.      Marland Kitchen aspirin 81 MG tablet Take 81 mg by mouth daily.      Marland Kitchen atorvastatin (LIPITOR) 10 MG tablet Take 10 mg by mouth daily.        Marland Kitchen BIOTIN PO Take 5,000 mg by mouth 2 (two) times a week.       . Calcium Carbonate-Vit D-Min (CALTRATE PLUS PO) Take 600 mg by mouth 2 (two) times daily.       . Multiple Vitamins-Minerals (CENTRUM PO) Take by mouth daily.        . potassium chloride SA (K-DUR,KLOR-CON) 20 MEQ tablet Take 20 mEq by mouth 2 (two) times daily.      . propranolol (INDERAL) 20 MG tablet Take 20 mg by mouth 2 (two) times daily.       Marland Kitchen triamterene-hydrochlorothiazide (MAXZIDE) 75-50 MG per tablet Take 0.5  tablets by mouth daily.       Marland Kitchen VAGIFEM 10 MCG TABS vaginal tablet USE VAGINALLY 3 TIMES WEEKLY AS NEEDED & AS DIRECTED  16 tablet  7  . valsartan (DIOVAN) 80 MG tablet Take 80 mg by mouth daily.        . verapamil (CALAN-SR) 180 MG CR tablet Take 180 mg by mouth at bedtime.       No current facility-administered medications for this encounter.    Physical Exam:  Filed Vitals:   12/30/12 1419  BP: 132/74  Pulse: 57  Temp: 98.6 F (37 C)  Resp: 20   ECoG performance status 0. She appears younger than her stated age. Her incision is well healed with Steri-Strips in place and no signs of infection. She has no lymphedema.  IMPRESSION: Teresa Chapman is a 72 y.o. female status post lumpectomy and sentinel lymph node biopsy for a T2 N0 lesion and a focally close margin of DCIS at the skin.  PLAN:  I discussed with Ms. Stansel and her husband the role of radiation and decreasing local failures in patients who undergo breast conservation. We had initially discussed the possibility of just proceeding on with antiestrogen therapy. However given the close margin with DCIS I think it's prudent to proceed on with whole breast radiation followed by a boost. We  discussed logistics of this including the process of simulation and the placement tattoos. We discussed 16 fractions to the breast followed by 5 fractions to the tumor cavity. We discussed skin redness and fatigue as possible side effects. We discussed the low likelihood of symptomatic lung or rib damage. We discussed the use of breath hold technique for cardiac sparing if necessary. She signed informed consent and agree to proceed forward. I scheduled her for simulation next week.    Lurline Hare, MD

## 2013-01-05 ENCOUNTER — Ambulatory Visit
Admission: RE | Admit: 2013-01-05 | Discharge: 2013-01-05 | Disposition: A | Discharge status: Home / Self Care | Payer: Medicare Other | Source: Ambulatory Visit | Attending: Radiation Oncology | Admitting: Radiation Oncology

## 2013-01-05 DIAGNOSIS — M79609 Pain in unspecified limb: Secondary | ICD-10-CM | POA: Diagnosis not present

## 2013-01-05 DIAGNOSIS — C50212 Malignant neoplasm of upper-inner quadrant of left female breast: Secondary | ICD-10-CM

## 2013-01-05 DIAGNOSIS — Z510 Encounter for antineoplastic radiation therapy: Secondary | ICD-10-CM | POA: Insufficient documentation

## 2013-01-05 DIAGNOSIS — C50219 Malignant neoplasm of upper-inner quadrant of unspecified female breast: Secondary | ICD-10-CM | POA: Diagnosis not present

## 2013-01-05 NOTE — Progress Notes (Signed)
Name: Suzanna B Fulgham   MRN: [id number]  Date:  01/05/2013  DOB: 1940-08-01  Status:outpatient    DIAGNOSIS: Breast cancer.  CONSENT VERIFIED: yes   SET UP: Patient is setup supine   IMMOBILIZATION:  The following immobilization was used:Custom Moldable Pillow, breast board.   NARRATIVE: Ms. Osmer was brought to the CT Simulation planning suite.  Identity was confirmed.  All relevant records and images related to the planned course of therapy were reviewed.  Then, the patient was positioned in a stable reproducible clinical set-up for radiation therapy.  Wires were placed to delineate the clinical extent of breast tissue. A wire was placed on the scar as well.  CT images were obtained.  An isocenter was placed. Skin markings were placed.  The CT images were loaded into the planning software where the target and avoidance structures were contoured.  The radiation prescription was entered and confirmed. The patient was discharged in stable condition and tolerated simulation well.    TREATMENT PLANNING NOTE:  Treatment planning then occurred. I have requested : MLC's, isodose plan, basic dose calculation  I personally designed and supervised the construction of 3 medically necessary complex treatment devices for the protection of critical normal structures including the lungs and contralateral breast as well as the immobilization device which is necessary for set up certainty.

## 2013-01-08 DIAGNOSIS — M79609 Pain in unspecified limb: Secondary | ICD-10-CM | POA: Diagnosis not present

## 2013-01-08 DIAGNOSIS — Z23 Encounter for immunization: Secondary | ICD-10-CM | POA: Diagnosis not present

## 2013-01-08 DIAGNOSIS — C50219 Malignant neoplasm of upper-inner quadrant of unspecified female breast: Secondary | ICD-10-CM | POA: Diagnosis not present

## 2013-01-08 DIAGNOSIS — Z510 Encounter for antineoplastic radiation therapy: Secondary | ICD-10-CM | POA: Diagnosis not present

## 2013-01-12 ENCOUNTER — Ambulatory Visit
Admission: RE | Admit: 2013-01-12 | Discharge: 2013-01-12 | Disposition: A | Discharge status: Home / Self Care | Payer: Medicare Other | Source: Ambulatory Visit | Attending: Radiation Oncology | Admitting: Radiation Oncology

## 2013-01-12 DIAGNOSIS — M79609 Pain in unspecified limb: Secondary | ICD-10-CM | POA: Diagnosis not present

## 2013-01-12 DIAGNOSIS — Z510 Encounter for antineoplastic radiation therapy: Secondary | ICD-10-CM | POA: Diagnosis not present

## 2013-01-12 DIAGNOSIS — C50219 Malignant neoplasm of upper-inner quadrant of unspecified female breast: Secondary | ICD-10-CM | POA: Diagnosis not present

## 2013-01-12 DIAGNOSIS — C50212 Malignant neoplasm of upper-inner quadrant of left female breast: Secondary | ICD-10-CM

## 2013-01-12 NOTE — Progress Notes (Signed)
  Radiation Oncology         [id number]) [phone] ________________________________  Name: Teresa Chapman MRN: [id number]  Date: 01/12/2013  DOB: 08/07/1940  Simulation Verification Note  Status: outpatient  NARRATIVE: The patient was brought to the treatment unit and placed in the planned treatment position. The clinical setup was verified. Then port films were obtained and uploaded to the radiation oncology medical record software.  The treatment beams were carefully compared against the planned radiation fields. The position location and shape of the radiation fields was reviewed. The targeted volume of tissue appears appropriately covered by the radiation beams. Organs at risk appear to be excluded as planned.  Based on my personal review, I approved the simulation verification. The patient's treatment will proceed as planned.  ------------------------------------------------  Lurline Hare, MD

## 2013-01-13 ENCOUNTER — Ambulatory Visit
Admission: RE | Admit: 2013-01-13 | Discharge: 2013-01-13 | Disposition: A | Discharge status: Home / Self Care | Payer: Medicare Other | Source: Ambulatory Visit | Attending: Radiation Oncology | Admitting: Radiation Oncology

## 2013-01-13 DIAGNOSIS — C50219 Malignant neoplasm of upper-inner quadrant of unspecified female breast: Secondary | ICD-10-CM | POA: Diagnosis not present

## 2013-01-13 DIAGNOSIS — Z510 Encounter for antineoplastic radiation therapy: Secondary | ICD-10-CM | POA: Diagnosis not present

## 2013-01-13 DIAGNOSIS — C50212 Malignant neoplasm of upper-inner quadrant of left female breast: Secondary | ICD-10-CM

## 2013-01-13 DIAGNOSIS — M79609 Pain in unspecified limb: Secondary | ICD-10-CM | POA: Diagnosis not present

## 2013-01-13 MED ORDER — ALRA NON-METALLIC DEODORANT (RAD-ONC)
1.0000 "application " | Freq: Once | TOPICAL | Status: AC
  Administered 2013-01-13: 1 via TOPICAL

## 2013-01-13 MED ORDER — RADIAPLEXRX EX GEL
Freq: Once | CUTANEOUS | Status: AC
  Administered 2013-01-13: 11:00:00 via TOPICAL

## 2013-01-14 ENCOUNTER — Ambulatory Visit
Admission: RE | Admit: 2013-01-14 | Discharge: 2013-01-14 | Disposition: A | Discharge status: Home / Self Care | Payer: Medicare Other | Source: Ambulatory Visit | Attending: Radiation Oncology | Admitting: Radiation Oncology

## 2013-01-14 DIAGNOSIS — Z510 Encounter for antineoplastic radiation therapy: Secondary | ICD-10-CM | POA: Diagnosis not present

## 2013-01-14 DIAGNOSIS — C50219 Malignant neoplasm of upper-inner quadrant of unspecified female breast: Secondary | ICD-10-CM | POA: Diagnosis not present

## 2013-01-14 DIAGNOSIS — M79609 Pain in unspecified limb: Secondary | ICD-10-CM | POA: Diagnosis not present

## 2013-01-15 ENCOUNTER — Ambulatory Visit
Admission: RE | Admit: 2013-01-15 | Discharge: 2013-01-15 | Disposition: A | Discharge status: Home / Self Care | Payer: Medicare Other | Source: Ambulatory Visit | Attending: Radiation Oncology | Admitting: Radiation Oncology

## 2013-01-15 DIAGNOSIS — M79609 Pain in unspecified limb: Secondary | ICD-10-CM | POA: Diagnosis not present

## 2013-01-15 DIAGNOSIS — Z510 Encounter for antineoplastic radiation therapy: Secondary | ICD-10-CM | POA: Diagnosis not present

## 2013-01-15 DIAGNOSIS — C50219 Malignant neoplasm of upper-inner quadrant of unspecified female breast: Secondary | ICD-10-CM | POA: Diagnosis not present

## 2013-01-18 ENCOUNTER — Ambulatory Visit
Admission: RE | Admit: 2013-01-18 | Discharge: 2013-01-18 | Disposition: A | Discharge status: Home / Self Care | Payer: Medicare Other | Source: Ambulatory Visit | Attending: Radiation Oncology | Admitting: Radiation Oncology

## 2013-01-18 DIAGNOSIS — C50219 Malignant neoplasm of upper-inner quadrant of unspecified female breast: Secondary | ICD-10-CM | POA: Diagnosis not present

## 2013-01-18 DIAGNOSIS — Z510 Encounter for antineoplastic radiation therapy: Secondary | ICD-10-CM | POA: Diagnosis not present

## 2013-01-18 DIAGNOSIS — M79609 Pain in unspecified limb: Secondary | ICD-10-CM | POA: Diagnosis not present

## 2013-01-19 ENCOUNTER — Ambulatory Visit
Admission: RE | Admit: 2013-01-19 | Discharge: 2013-01-19 | Disposition: A | Discharge status: Home / Self Care | Payer: Medicare Other | Source: Ambulatory Visit | Attending: Radiation Oncology | Admitting: Radiation Oncology

## 2013-01-19 ENCOUNTER — Encounter: Payer: Self-pay | Admitting: Radiation Oncology

## 2013-01-19 VITALS — BP 126/85 | HR 56 | Temp 98.4°F | Resp 20 | Wt 166.2 lb

## 2013-01-19 DIAGNOSIS — C50212 Malignant neoplasm of upper-inner quadrant of left female breast: Secondary | ICD-10-CM

## 2013-01-19 DIAGNOSIS — Z510 Encounter for antineoplastic radiation therapy: Secondary | ICD-10-CM | POA: Diagnosis not present

## 2013-01-19 DIAGNOSIS — C50219 Malignant neoplasm of upper-inner quadrant of unspecified female breast: Secondary | ICD-10-CM | POA: Diagnosis not present

## 2013-01-19 DIAGNOSIS — M79609 Pain in unspecified limb: Secondary | ICD-10-CM | POA: Diagnosis not present

## 2013-01-19 NOTE — Progress Notes (Signed)
Weekly rad txs, lt breast, not tx yet, machine down, very slight erythema under axilla and around niple area,skin intact, no c/o pain, slight itchy ness, using radiaplex gel bid, not using alra deodorant,too hard, Appetite good, drinks plenty fluids 10:12 AM

## 2013-01-19 NOTE — Progress Notes (Signed)
[facility]    Radiation Oncology [address] [place]     Maryln Gottron, M.D. [place], [place] [address]               Billie Lade, M.D., Ph.D. Phone: [phone]      Molli Hazard A. Kathrynn Running, M.D. Fax: [phone]      Radene Gunning, M.D., Ph.D.         Lurline Hare, M.D.         Grayland Jack, M.D Weekly Treatment Management Note  Name: Teresa Chapman     MRN: [id number]        CSN: [id number] Date: 01/19/2013      DOB: 12-Nov-1940  CC: Garlan Fillers, MD         Eloise Harman    Status: Outpatient  Diagnosis: The encounter diagnosis was Breast cancer of upper-inner quadrant of left female breast.  Current Dose: 12.5 Gy  Current Fraction: 5  Planned Dose: 42.5 Gy  Narrative: [name] was seen today for weekly treatment management. The chart was checked and port films  were reviewed. She is tolerating the treatments well at this time without any side effects.  Augmentin and Codeine  Current Outpatient Prescriptions  Medication Sig Dispense Refill  . acetaminophen (TYLENOL) 500 MG tablet Take 1,000 mg by mouth every 6 (six) hours as needed for pain.      Marland Kitchen aspirin 81 MG tablet Take 81 mg by mouth daily.      Marland Kitchen atorvastatin (LIPITOR) 10 MG tablet Take 10 mg by mouth daily.        Marland Kitchen BIOTIN PO Take 5,000 mg by mouth 2 (two) times a week.       . Calcium Carbonate-Vit D-Min (CALTRATE PLUS PO) Take 600 mg by mouth 2 (two) times daily.       . Multiple Vitamins-Minerals (CENTRUM PO) Take by mouth daily.        . potassium chloride SA (K-DUR,KLOR-CON) 20 MEQ tablet Take 20 mEq by mouth 2 (two) times daily.      . propranolol (INDERAL) 20 MG tablet Take 20 mg by mouth 2 (two) times daily.       Marland Kitchen triamterene-hydrochlorothiazide (MAXZIDE) 75-50 MG per tablet Take 0.5 tablets by mouth daily.       Marland Kitchen VAGIFEM 10 MCG TABS vaginal tablet USE VAGINALLY 3 TIMES WEEKLY AS NEEDED & AS DIRECTED  16 tablet  7  . valsartan (DIOVAN) 80 MG tablet Take 80 mg by mouth daily.         . verapamil (CALAN-SR) 180 MG CR tablet Take 180 mg by mouth at bedtime.       No current facility-administered medications for this encounter.      Physical Examination:  weight is 166 lb 3.2 oz (75.388 kg). Her oral temperature is 98.4 F (36.9 C). Her blood pressure is 126/85 and her pulse is 56. Her respiration is 20.    Wt Readings from Last 3 Encounters:  01/19/13 166 lb 3.2 oz (75.388 kg)  12/30/12 166 lb 9.6 oz (75.569 kg)  12/25/12 166 lb (75.297 kg)    Minimal hyperpigmentation changes noted in the left breast. Lungs - Normal respiratory effort, chest expands symmetrically. Lungs are clear to auscultation, no crackles or wheezes.  Heart has regular rhythm and rate  Abdomen is soft and non tender with normal bowel sounds  Assessment:  Patient tolerating treatments well  Plan: Continue treatment per original radiation prescription

## 2013-01-20 ENCOUNTER — Ambulatory Visit
Admission: RE | Admit: 2013-01-20 | Discharge: 2013-01-20 | Disposition: A | Discharge status: Home / Self Care | Payer: Medicare Other | Source: Ambulatory Visit | Attending: Radiation Oncology | Admitting: Radiation Oncology

## 2013-01-20 DIAGNOSIS — C50219 Malignant neoplasm of upper-inner quadrant of unspecified female breast: Secondary | ICD-10-CM | POA: Diagnosis not present

## 2013-01-20 DIAGNOSIS — Z510 Encounter for antineoplastic radiation therapy: Secondary | ICD-10-CM | POA: Diagnosis not present

## 2013-01-20 DIAGNOSIS — M79609 Pain in unspecified limb: Secondary | ICD-10-CM | POA: Diagnosis not present

## 2013-01-21 ENCOUNTER — Ambulatory Visit
Admission: RE | Admit: 2013-01-21 | Discharge: 2013-01-21 | Disposition: A | Discharge status: Home / Self Care | Payer: Medicare Other | Source: Ambulatory Visit | Attending: Radiation Oncology | Admitting: Radiation Oncology

## 2013-01-21 DIAGNOSIS — M79609 Pain in unspecified limb: Secondary | ICD-10-CM | POA: Diagnosis not present

## 2013-01-21 DIAGNOSIS — Z510 Encounter for antineoplastic radiation therapy: Secondary | ICD-10-CM | POA: Diagnosis not present

## 2013-01-21 DIAGNOSIS — C50219 Malignant neoplasm of upper-inner quadrant of unspecified female breast: Secondary | ICD-10-CM | POA: Diagnosis not present

## 2013-01-22 ENCOUNTER — Ambulatory Visit
Admission: RE | Admit: 2013-01-22 | Discharge: 2013-01-22 | Disposition: A | Discharge status: Home / Self Care | Payer: Medicare Other | Source: Ambulatory Visit | Attending: Radiation Oncology | Admitting: Radiation Oncology

## 2013-01-22 DIAGNOSIS — C50219 Malignant neoplasm of upper-inner quadrant of unspecified female breast: Secondary | ICD-10-CM | POA: Diagnosis not present

## 2013-01-22 DIAGNOSIS — M79609 Pain in unspecified limb: Secondary | ICD-10-CM | POA: Diagnosis not present

## 2013-01-22 DIAGNOSIS — Z510 Encounter for antineoplastic radiation therapy: Secondary | ICD-10-CM | POA: Diagnosis not present

## 2013-01-25 ENCOUNTER — Ambulatory Visit: Payer: Medicare Other | Attending: General Surgery | Admitting: Physical Therapy

## 2013-01-25 ENCOUNTER — Ambulatory Visit
Admission: RE | Admit: 2013-01-25 | Discharge: 2013-01-25 | Disposition: A | Discharge status: Home / Self Care | Payer: Medicare Other | Source: Ambulatory Visit | Attending: Radiation Oncology | Admitting: Radiation Oncology

## 2013-01-25 DIAGNOSIS — R293 Abnormal posture: Secondary | ICD-10-CM | POA: Diagnosis not present

## 2013-01-25 DIAGNOSIS — M4000 Postural kyphosis, site unspecified: Secondary | ICD-10-CM | POA: Insufficient documentation

## 2013-01-25 DIAGNOSIS — Z01818 Encounter for other preprocedural examination: Secondary | ICD-10-CM | POA: Insufficient documentation

## 2013-01-25 DIAGNOSIS — C50919 Malignant neoplasm of unspecified site of unspecified female breast: Secondary | ICD-10-CM | POA: Diagnosis not present

## 2013-01-25 DIAGNOSIS — IMO0001 Reserved for inherently not codable concepts without codable children: Secondary | ICD-10-CM | POA: Insufficient documentation

## 2013-01-25 DIAGNOSIS — M79609 Pain in unspecified limb: Secondary | ICD-10-CM | POA: Diagnosis not present

## 2013-01-25 DIAGNOSIS — Z510 Encounter for antineoplastic radiation therapy: Secondary | ICD-10-CM | POA: Diagnosis not present

## 2013-01-25 DIAGNOSIS — C50219 Malignant neoplasm of upper-inner quadrant of unspecified female breast: Secondary | ICD-10-CM | POA: Diagnosis not present

## 2013-01-26 ENCOUNTER — Encounter: Payer: Self-pay | Admitting: Radiation Oncology

## 2013-01-26 ENCOUNTER — Ambulatory Visit
Admission: RE | Admit: 2013-01-26 | Discharge: 2013-01-26 | Disposition: A | Discharge status: Home / Self Care | Payer: Medicare Other | Source: Ambulatory Visit | Attending: Radiation Oncology | Admitting: Radiation Oncology

## 2013-01-26 VITALS — BP 128/69 | HR 57 | Temp 98.3°F | Wt 167.8 lb

## 2013-01-26 DIAGNOSIS — C50212 Malignant neoplasm of upper-inner quadrant of left female breast: Secondary | ICD-10-CM

## 2013-01-26 DIAGNOSIS — Z510 Encounter for antineoplastic radiation therapy: Secondary | ICD-10-CM | POA: Diagnosis not present

## 2013-01-26 DIAGNOSIS — M79609 Pain in unspecified limb: Secondary | ICD-10-CM | POA: Diagnosis not present

## 2013-01-26 DIAGNOSIS — C50219 Malignant neoplasm of upper-inner quadrant of unspecified female breast: Secondary | ICD-10-CM | POA: Diagnosis not present

## 2013-01-26 NOTE — Progress Notes (Signed)
Patient here for routine  weekly assessment of left breast.Completed 10 of 17 treatments.Skin changes very mild.Has some left arm discomfort from visit with rehab on yesterday.Inquiring about pain medication stronger than tylenol to help with pain.Generalized fatigue at night.

## 2013-01-26 NOTE — Progress Notes (Signed)
Name: Elania B Presutti   MRN: [id number]  Date:  01/26/2013   DOB: 1940/06/06  Status:outpatient    DIAGNOSIS: Breast cancer.  CONSENT VERIFIED: yes   SET UP: Patient is setup supine   IMMOBILIZATION:  The following immobilization was used:Custom Moldable Pillow, breast board.   NARRATIVE: Donesha B Lemon underwent complex simulation and treatment planning for her boost treatment today.  Her tumor volume was outlined on the planning CT scan. The depth of her cavity was 5.3 cm.    15  MeV electrons will be prescribed to the 90% isodose line.   A block will be used for beam modification purposes.  A special port plan is requested.

## 2013-01-26 NOTE — Progress Notes (Signed)
Weekly Management Note Current Dose: 25  Gy  Projected Dose: 42.5 Gy   Narrative:  The patient presents for routine under treatment assessment.  CBCT/MVCT images/Port film x-rays were reviewed.  The chart was checked. Doing well. Some left arm pain and did not sleep last night after PT and treatment. Tried tylenol with no relief. No skin concerns  Physical Findings: Weight: 167 lb 12.8 oz (76.114 kg). Slightly pink left breast. Inframammary fold clear  Impression:  The patient is tolerating radiation.  Plan:  Continue treatment as planned. Encouraged her to try ibuprofen or Aleve if she can. If stilli n pain by Friday, stop by nursing and we can try tramadol.

## 2013-01-27 ENCOUNTER — Ambulatory Visit
Admission: RE | Admit: 2013-01-27 | Discharge: 2013-01-27 | Disposition: A | Discharge status: Home / Self Care | Payer: Medicare Other | Source: Ambulatory Visit | Attending: Radiation Oncology | Admitting: Radiation Oncology

## 2013-01-27 DIAGNOSIS — M79609 Pain in unspecified limb: Secondary | ICD-10-CM | POA: Diagnosis not present

## 2013-01-27 DIAGNOSIS — Z510 Encounter for antineoplastic radiation therapy: Secondary | ICD-10-CM | POA: Diagnosis not present

## 2013-01-27 DIAGNOSIS — C50219 Malignant neoplasm of upper-inner quadrant of unspecified female breast: Secondary | ICD-10-CM | POA: Diagnosis not present

## 2013-01-28 ENCOUNTER — Ambulatory Visit
Admission: RE | Admit: 2013-01-28 | Discharge: 2013-01-28 | Disposition: A | Discharge status: Home / Self Care | Payer: Medicare Other | Source: Ambulatory Visit | Attending: Radiation Oncology | Admitting: Radiation Oncology

## 2013-01-28 DIAGNOSIS — Z510 Encounter for antineoplastic radiation therapy: Secondary | ICD-10-CM | POA: Diagnosis not present

## 2013-01-28 DIAGNOSIS — C50219 Malignant neoplasm of upper-inner quadrant of unspecified female breast: Secondary | ICD-10-CM | POA: Diagnosis not present

## 2013-01-28 DIAGNOSIS — M79609 Pain in unspecified limb: Secondary | ICD-10-CM | POA: Diagnosis not present

## 2013-01-29 ENCOUNTER — Ambulatory Visit
Admission: RE | Admit: 2013-01-29 | Discharge: 2013-01-29 | Disposition: A | Discharge status: Home / Self Care | Payer: Medicare Other | Source: Ambulatory Visit | Attending: Radiation Oncology | Admitting: Radiation Oncology

## 2013-01-29 DIAGNOSIS — C50219 Malignant neoplasm of upper-inner quadrant of unspecified female breast: Secondary | ICD-10-CM | POA: Diagnosis not present

## 2013-01-29 DIAGNOSIS — Z510 Encounter for antineoplastic radiation therapy: Secondary | ICD-10-CM | POA: Diagnosis not present

## 2013-01-29 DIAGNOSIS — M79609 Pain in unspecified limb: Secondary | ICD-10-CM | POA: Diagnosis not present

## 2013-02-01 ENCOUNTER — Ambulatory Visit
Admission: RE | Admit: 2013-02-01 | Discharge: 2013-02-01 | Disposition: A | Discharge status: Home / Self Care | Payer: Medicare Other | Source: Ambulatory Visit | Attending: Radiation Oncology | Admitting: Radiation Oncology

## 2013-02-01 DIAGNOSIS — Z510 Encounter for antineoplastic radiation therapy: Secondary | ICD-10-CM | POA: Diagnosis not present

## 2013-02-01 DIAGNOSIS — M79609 Pain in unspecified limb: Secondary | ICD-10-CM | POA: Diagnosis not present

## 2013-02-01 DIAGNOSIS — C50219 Malignant neoplasm of upper-inner quadrant of unspecified female breast: Secondary | ICD-10-CM | POA: Diagnosis not present

## 2013-02-02 ENCOUNTER — Ambulatory Visit
Admission: RE | Admit: 2013-02-02 | Discharge: 2013-02-02 | Disposition: A | Discharge status: Home / Self Care | Payer: Medicare Other | Source: Ambulatory Visit | Attending: Radiation Oncology | Admitting: Radiation Oncology

## 2013-02-02 VITALS — BP 118/62 | HR 55 | Temp 98.2°F | Wt 165.7 lb

## 2013-02-02 DIAGNOSIS — C50212 Malignant neoplasm of upper-inner quadrant of left female breast: Secondary | ICD-10-CM

## 2013-02-02 DIAGNOSIS — M79609 Pain in unspecified limb: Secondary | ICD-10-CM | POA: Diagnosis not present

## 2013-02-02 DIAGNOSIS — C50219 Malignant neoplasm of upper-inner quadrant of unspecified female breast: Secondary | ICD-10-CM | POA: Diagnosis not present

## 2013-02-02 DIAGNOSIS — Z510 Encounter for antineoplastic radiation therapy: Secondary | ICD-10-CM | POA: Diagnosis not present

## 2013-02-02 NOTE — Progress Notes (Signed)
Weekly Management Note Current Dose: 37.5  Gy  Projected Dose: 50 Gy   Narrative:  The patient presents for routine under treatment assessment.  CBCT/MVCT images/Port film x-rays were reviewed.  The chart was checked. Itching over the entire breast.   Physical Findings: Weight: 165 lb 11.2 oz (75.161 kg). Skin redness over left breast  Impression:  The patient is tolerating radiation.  Plan:  Continue treatment as planned. Switch to biafene.

## 2013-02-02 NOTE — Progress Notes (Signed)
Patient for weekly assessment of radiation to left breast.Completed 15 of 17 treatments.Mild redness with itching of whole treatment field.Will give biafine today.Mild fatigue.

## 2013-02-03 ENCOUNTER — Ambulatory Visit
Admission: RE | Admit: 2013-02-03 | Discharge: 2013-02-03 | Disposition: A | Discharge status: Home / Self Care | Payer: Medicare Other | Source: Ambulatory Visit | Attending: Radiation Oncology | Admitting: Radiation Oncology

## 2013-02-03 DIAGNOSIS — M79609 Pain in unspecified limb: Secondary | ICD-10-CM | POA: Diagnosis not present

## 2013-02-03 DIAGNOSIS — C50219 Malignant neoplasm of upper-inner quadrant of unspecified female breast: Secondary | ICD-10-CM | POA: Diagnosis not present

## 2013-02-03 DIAGNOSIS — Z510 Encounter for antineoplastic radiation therapy: Secondary | ICD-10-CM | POA: Diagnosis not present

## 2013-02-03 MED ORDER — BIAFINE EX EMUL
CUTANEOUS | Status: AC | PRN
  Administered 2013-02-03: 09:00:00 via TOPICAL

## 2013-02-03 NOTE — Addendum Note (Signed)
Encounter addended by: Tessa Lerner, RN on: 02/03/2013  9:17 AM<BR>     Documentation filed: Orders

## 2013-02-03 NOTE — Addendum Note (Signed)
Encounter addended by: Tessa Lerner, RN on: 02/03/2013  9:28 AM<BR>     Documentation filed: Inpatient MAR

## 2013-02-04 ENCOUNTER — Ambulatory Visit
Admission: RE | Admit: 2013-02-04 | Discharge: 2013-02-04 | Disposition: A | Discharge status: Home / Self Care | Payer: Medicare Other | Source: Ambulatory Visit | Attending: Radiation Oncology | Admitting: Radiation Oncology

## 2013-02-04 DIAGNOSIS — M79609 Pain in unspecified limb: Secondary | ICD-10-CM | POA: Diagnosis not present

## 2013-02-04 DIAGNOSIS — C50219 Malignant neoplasm of upper-inner quadrant of unspecified female breast: Secondary | ICD-10-CM | POA: Diagnosis not present

## 2013-02-04 DIAGNOSIS — Z510 Encounter for antineoplastic radiation therapy: Secondary | ICD-10-CM | POA: Diagnosis not present

## 2013-02-05 ENCOUNTER — Ambulatory Visit
Admission: RE | Admit: 2013-02-05 | Discharge: 2013-02-05 | Disposition: A | Discharge status: Home / Self Care | Payer: Medicare Other | Source: Ambulatory Visit | Attending: Radiation Oncology | Admitting: Radiation Oncology

## 2013-02-05 DIAGNOSIS — C50219 Malignant neoplasm of upper-inner quadrant of unspecified female breast: Secondary | ICD-10-CM | POA: Diagnosis not present

## 2013-02-05 DIAGNOSIS — Z510 Encounter for antineoplastic radiation therapy: Secondary | ICD-10-CM | POA: Diagnosis not present

## 2013-02-05 DIAGNOSIS — M79609 Pain in unspecified limb: Secondary | ICD-10-CM | POA: Diagnosis not present

## 2013-02-08 ENCOUNTER — Ambulatory Visit
Admission: RE | Admit: 2013-02-08 | Discharge: 2013-02-08 | Disposition: A | Discharge status: Home / Self Care | Payer: Medicare Other | Source: Ambulatory Visit | Attending: Radiation Oncology | Admitting: Radiation Oncology

## 2013-02-08 DIAGNOSIS — C50219 Malignant neoplasm of upper-inner quadrant of unspecified female breast: Secondary | ICD-10-CM | POA: Diagnosis not present

## 2013-02-08 DIAGNOSIS — Z510 Encounter for antineoplastic radiation therapy: Secondary | ICD-10-CM | POA: Diagnosis not present

## 2013-02-08 DIAGNOSIS — M79609 Pain in unspecified limb: Secondary | ICD-10-CM | POA: Diagnosis not present

## 2013-02-09 ENCOUNTER — Ambulatory Visit
Admission: RE | Admit: 2013-02-09 | Discharge: 2013-02-09 | Disposition: A | Discharge status: Home / Self Care | Payer: Medicare Other | Source: Ambulatory Visit | Attending: Radiation Oncology | Admitting: Radiation Oncology

## 2013-02-09 ENCOUNTER — Encounter: Payer: Self-pay | Admitting: Radiation Oncology

## 2013-02-09 VITALS — BP 124/57 | HR 63 | Temp 98.1°F | Wt 166.3 lb

## 2013-02-09 DIAGNOSIS — C50219 Malignant neoplasm of upper-inner quadrant of unspecified female breast: Secondary | ICD-10-CM | POA: Diagnosis not present

## 2013-02-09 DIAGNOSIS — M79609 Pain in unspecified limb: Secondary | ICD-10-CM | POA: Diagnosis not present

## 2013-02-09 DIAGNOSIS — C50212 Malignant neoplasm of upper-inner quadrant of left female breast: Secondary | ICD-10-CM

## 2013-02-09 DIAGNOSIS — Z510 Encounter for antineoplastic radiation therapy: Secondary | ICD-10-CM | POA: Diagnosis not present

## 2013-02-09 MED ORDER — BIAFINE EX EMUL
CUTANEOUS | Status: DC | PRN
  Administered 2013-02-09: 17:00:00 via TOPICAL

## 2013-02-09 NOTE — Addendum Note (Signed)
Encounter addended by: Tessa Lerner, RN on: 02/09/2013  4:44 PM<BR>     Documentation filed: Orders

## 2013-02-09 NOTE — Addendum Note (Signed)
Encounter addended by: Tessa Lerner, RN on: 02/09/2013  4:48 PM<BR>     Documentation filed: Inpatient MAR

## 2013-02-09 NOTE — Progress Notes (Signed)
  Radiation Oncology         [id number]) [phone] ________________________________  Name: Teresa Chapman MRN: [id number]  Date: 02/09/2013  DOB: Leandria 07, 1942  End of Treatment Note  Diagnosis:   T2N0 Left breast Cancer     Indication for treatment:  Curative       Radiation treatment dates:   01/13/13-02/09/13  Site/dose:    Left breast / 42.5 @ 2.5 Gy per fraction x 17 fractions Left breast boost / 7.5 @ 2.5 Gy per fraction x 3 fractions  Beams/energy:   Opposed tangents with 10 MV photons were used on the initial fields with a 0.3 cm bolus daily on the scar. This was followed by a boost using 15 MeV electrons to the tumor cavity with margin. The plan was prescribed to the 90% IDL and was delivered in an enface direction.   Narrative: The patient tolerated radiation treatment relatively well.   She had the expected skin changes and minimal fatige.   Plan:  She has completed radiation treatment. The patient will return to radiation oncology clinic for routine followup in one month. I advised them to call or return sooner if they have any questions or concerns related to their recovery or treatment.  ------------------------------------------------  Lurline Hare, MD

## 2013-02-09 NOTE — Progress Notes (Signed)
Patient completes 20 of 20 treatments to left breast.Skin with moderate discoloration.No peeling.Radiation stickers removed with adhesive remover pads.Patient to continue application of biafine, will give additional tube today.Mild fatigue.One month follow up to be scheduled.

## 2013-02-09 NOTE — Progress Notes (Signed)
Weekly Management Note Current Dose:  50 Gy  Projected Dose: 50 Gy   Narrative:  The patient presents for routine under treatment assessment.  CBCT/MVCT images/Port film x-rays were reviewed.  The chart was checked. Doing well. Biafene is soothing. Mild fatigue. Would like to start back exercising.   Physical Findings: Weight: 166 lb 4.8 oz (75.433 kg). Unchanged. Red skin. No breakdown  Impression:  Finishes RT today.   Plan:  Continue biafene. Switch to lotion with vit e after 2 weeks. Ok to resume light exercise and weights up to 10 pounds. F/u with med onc nov 4. Follow up with me in 1 month.

## 2013-02-10 ENCOUNTER — Ambulatory Visit: Payer: Medicare Other

## 2013-02-10 DIAGNOSIS — I10 Essential (primary) hypertension: Secondary | ICD-10-CM | POA: Diagnosis not present

## 2013-02-10 DIAGNOSIS — E785 Hyperlipidemia, unspecified: Secondary | ICD-10-CM | POA: Diagnosis not present

## 2013-02-10 DIAGNOSIS — M899 Disorder of bone, unspecified: Secondary | ICD-10-CM | POA: Diagnosis not present

## 2013-02-10 DIAGNOSIS — R82998 Other abnormal findings in urine: Secondary | ICD-10-CM | POA: Diagnosis not present

## 2013-02-17 DIAGNOSIS — C50919 Malignant neoplasm of unspecified site of unspecified female breast: Secondary | ICD-10-CM | POA: Diagnosis not present

## 2013-02-17 DIAGNOSIS — Z0000 Encounter for general adult medical examination without abnormal findings: Secondary | ICD-10-CM | POA: Diagnosis not present

## 2013-02-17 DIAGNOSIS — Z6830 Body mass index (BMI) 30.0-30.9, adult: Secondary | ICD-10-CM | POA: Diagnosis not present

## 2013-02-17 DIAGNOSIS — I10 Essential (primary) hypertension: Secondary | ICD-10-CM | POA: Diagnosis not present

## 2013-02-17 DIAGNOSIS — E785 Hyperlipidemia, unspecified: Secondary | ICD-10-CM | POA: Diagnosis not present

## 2013-02-17 DIAGNOSIS — Z23 Encounter for immunization: Secondary | ICD-10-CM | POA: Diagnosis not present

## 2013-02-17 DIAGNOSIS — M899 Disorder of bone, unspecified: Secondary | ICD-10-CM | POA: Diagnosis not present

## 2013-02-17 DIAGNOSIS — Z1331 Encounter for screening for depression: Secondary | ICD-10-CM | POA: Diagnosis not present

## 2013-02-17 DIAGNOSIS — R002 Palpitations: Secondary | ICD-10-CM | POA: Diagnosis not present

## 2013-02-22 ENCOUNTER — Ambulatory Visit: Payer: Medicare Other | Attending: General Surgery | Admitting: Physical Therapy

## 2013-02-22 DIAGNOSIS — R293 Abnormal posture: Secondary | ICD-10-CM | POA: Insufficient documentation

## 2013-02-22 DIAGNOSIS — Z01818 Encounter for other preprocedural examination: Secondary | ICD-10-CM | POA: Insufficient documentation

## 2013-02-22 DIAGNOSIS — IMO0001 Reserved for inherently not codable concepts without codable children: Secondary | ICD-10-CM | POA: Insufficient documentation

## 2013-02-22 DIAGNOSIS — M4000 Postural kyphosis, site unspecified: Secondary | ICD-10-CM | POA: Diagnosis not present

## 2013-02-22 DIAGNOSIS — C50919 Malignant neoplasm of unspecified site of unspecified female breast: Secondary | ICD-10-CM | POA: Diagnosis not present

## 2013-02-23 ENCOUNTER — Other Ambulatory Visit (HOSPITAL_BASED_OUTPATIENT_CLINIC_OR_DEPARTMENT_OTHER): Payer: Medicare Other | Admitting: Lab

## 2013-02-23 ENCOUNTER — Telehealth: Payer: Self-pay | Admitting: *Deleted

## 2013-02-23 ENCOUNTER — Ambulatory Visit (HOSPITAL_BASED_OUTPATIENT_CLINIC_OR_DEPARTMENT_OTHER): Payer: Medicare Other | Admitting: Oncology

## 2013-02-23 VITALS — BP 131/73 | HR 60 | Temp 98.3°F | Resp 20 | Ht 61.0 in | Wt 166.7 lb

## 2013-02-23 DIAGNOSIS — C50212 Malignant neoplasm of upper-inner quadrant of left female breast: Secondary | ICD-10-CM

## 2013-02-23 DIAGNOSIS — C50219 Malignant neoplasm of upper-inner quadrant of unspecified female breast: Secondary | ICD-10-CM

## 2013-02-23 DIAGNOSIS — M899 Disorder of bone, unspecified: Secondary | ICD-10-CM | POA: Diagnosis not present

## 2013-02-23 DIAGNOSIS — Z170 Estrogen receptor positive status [ER+]: Secondary | ICD-10-CM

## 2013-02-23 LAB — CBC WITH DIFFERENTIAL/PLATELET
Basophils Absolute: 0 10*3/uL (ref 0.0–0.1)
EOS%: 3 % (ref 0.0–7.0)
HCT: 38.2 % (ref 34.8–46.6)
HGB: 12.6 g/dL (ref 11.6–15.9)
MCH: 28.4 pg (ref 25.1–34.0)
MCV: 86.2 fL (ref 79.5–101.0)
MONO%: 11.5 % (ref 0.0–14.0)
NEUT%: 59.1 % (ref 38.4–76.8)
lymph#: 1.4 10*3/uL (ref 0.9–3.3)

## 2013-02-23 LAB — COMPREHENSIVE METABOLIC PANEL (CC13)
AST: 23 U/L (ref 5–34)
BUN: 16.3 mg/dL (ref 7.0–26.0)
Calcium: 10.3 mg/dL (ref 8.4–10.4)
Chloride: 106 mEq/L (ref 98–109)
Creatinine: 0.9 mg/dL (ref 0.6–1.1)

## 2013-02-23 MED ORDER — ANASTROZOLE 1 MG PO TABS: 1.0000 mg | ORAL_TABLET | Freq: Every day | ORAL | Status: DC

## 2013-02-23 NOTE — Telephone Encounter (Signed)
appts made and printed...td 

## 2013-02-23 NOTE — Progress Notes (Signed)
Patient ID: Teresa Chapman, female   DOB: 03/11/1941, 72 y.o.   MRN: [id number] ID: Teresa Chapman OB: June 13, 1940  MR#: [id number]  NWG#:[id number]  PCP: Garlan Fillers, MD GYN:  Colin Broach SU: Emelia Loron OTHER MD: Lurline Hare, Karlyn Agee   HISTORY OF PRESENT ILLNESS: Teresa Chapman had bilateral screening mammography at the breast Center 10/06/2012 showing a possible mass in the left breast. Additional views showed that the right breast mass was a simple cyst. However spot compression views on the left showed an area of increased density in the upper inner quadrant. There was no palpable mass on physical exam. Ultrasound however confirmed a hypoechoic irregular mass in the left breast measuring 1.3 cm. Biopsy of this mass 10/28/2012 showed an invasive ductal carcinoma, grade 2, estrogen receptor 90% positive, progesterone receptor 10% positive, with an MIB-1 of 20% and no HER-2 amplification.  On 11/03/2012 the patient underwent bilateral breast MRI this showed only the left breast mass in question, which measured 1.9 cm on this modality. There were no abnormal appearing lymph nodes.  The patient's subsequent history is as detailed below  INTERVAL HISTORY: Teresa Chapman returns today for followup of her breast cancer accompanied by her husband Teresa Chapman. Since her last visit here she completed her radiation treatments. She did well with these, with no significant fatigue and only mild erythema, no desquamation. She does still have some itching, but this is improving.   REVIEW OF SYSTEMS: Just mild sinus symptoms this time of year, but no particular problems with shortness of breath, cough, hemoptysis, or pleurisy. She is exercising regularly, usually twice a week with a trainer and otherwise on her own. She just in treated are lymphedema prevention program and is being measured for a sleeve. Aside from this a detailed review of systems today was noncontributory  PAST MEDICAL HISTORY: Past Medical History   Diagnosis Date  . Osteopenia   . Atrophic vaginitis   . Hot flashes   . Hyperlipidemia     takes Atorvastatin daily  . Hypertension     takes Inderal,Maxzide,and Diovan daily  . PONV (postoperative nausea and vomiting)   . Myocardial bridge     intra  . History of bronchitis 04/23/2012  . Arthritis     right wrist and finger joints  . Scoliosis   . Osteoarthritis   . History of colon polyps   . Diverticulosis   . Urinary frequency   . Urinary urgency   . Stress incontinence   . History of blood transfusion 1971    "when my son was born"  (11/18/2012); no abnormal reaction noted  . Cataracts, bilateral     immature  . Insomnia     infreqently takes Lunesta  . History of shingles 78yrs ago and in 2013  . Lung cancer 2009  . Breast cancer, left breast 10/2012  . Basal cell cancer     "right face; RLE" (11/18/2012)  . Squamous cell carcinoma     "abdomen" (11/18/2012)  . DVT (deep venous thrombosis)     "? right side; years and years ago" (11/18/2012)  . Exertional shortness of breath   . Hepatitis B 1970's  . Anxiety     "related to breast cancer; totally unexpected" (11/18/2012)  . Wears glasses   . Dysrhythmia     hx pvc    PAST SURGICAL HISTORY: Past Surgical History  Procedure Laterality Date  . Lung removal, partial Right 2009    upper lobe  . Vaginal hysterectomy  1974  .  Cholecystectomy    . Appendectomy    . Dilation and curettage of uterus    . Tonsillectomy and adenoidectomy    . Thymectomy  ~ 1994  . Lung biopsy Right 2009  . Colonoscopy      "I've had a few; last one was 2010" (11/18/2012)  . Cardiac catheterization  03/2010  . Basal cell carcinoma excision      "right face; RLE" (11/18/2012)  . Squamous cell carcinoma excision      "?abdomen" (11/18/2012)  . Breast biopsy Right ~ 1962    "benign" (11/18/2012)  . Breast biopsy Right 10/2012    "benign cyst" (11/18/2012)  . Breast biopsy Left 10/2012  . Breast lumpectomy Left 11/18/2012    "w/sentinel  node bx" (11/18/2012)  . Breast lumpectomy with needle localization and axillary sentinel lymph node bx Left 11/18/2012    Procedure: LEFT BREAST LUMPECTOMY WITH NEEDLE LOCALIZATION AND AXILLARY SENTINEL LYMPH NODE BX;  Surgeon: Emelia Loron, MD;  Location: [facility] OR;  Service: General;  Laterality: Left;  . Re-excision of breast lumpectomy Left 12/10/2012    Procedure: RE-EXCISION OF BREAST LUMPECTOMY;  Surgeon: Emelia Loron, MD;  Location: [facility];  Service: General;  Laterality: Left;    FAMILY HISTORY Family History  Problem Relation Age of Onset  . Hypertension Mother   . Colon cancer Mother   . Breast cancer Mother     Age late 82's  . Heart disease Father    the patient's father died at the age of 47 from a myocardial infarction. The patient's mother died at the age of 44 from Alzheimer's disease. She had been diagnosed with colon cancer at the age of 15 and breast cancer at the age of 49. The patient had one brother who died at birth. No sisters. There is no history of ovarian cancer in the family.  GYNECOLOGIC HISTORY:  Menarche age 80, menopause in her early 35s. She tells me she took birth control pills briefly and developed a clot. She took hormone replacement approximately 10 years, however, without complications.. The patient is GX P2, with first live birth at age 38.  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 Remonia Richter. Her husband [name] "Teresa Chapman" anxious is a retired Human resources officer. Son Teresa Chapman lives in [place] where he works in Consulting civil engineer. Son Teresa Chapman lives in [place] where he is a Transport planner. The patient has 3 grandchildren. She attends the [employer]     ADVANCED DIRECTIVES: In place   HEALTH MAINTENANCE: History  Substance Use Topics  . Smoking status: Never Smoker   . Smokeless tobacco: Never Used  . Alcohol Use: 0.0 oz/week      Comment: 11/18/2012 "glass of wine twice/month"     Colonoscopy:  PAP:  Bone density: July 2014  Lipid panel:  Allergies  Allergen Reactions  . Augmentin [Amoxicillin-Pot Clavulanate] Rash  . Codeine Rash    REACTION: hyperactivity, itching    Current Outpatient Prescriptions  Medication Sig Dispense Refill  . acetaminophen (TYLENOL) 500 MG tablet Take 1,000 mg by mouth every 6 (six) hours as needed for pain.      Marland Kitchen aspirin 81 MG tablet Take 81 mg by mouth daily.      Marland Kitchen atorvastatin (LIPITOR) 10 MG tablet Take 10 mg by mouth daily.        Marland Kitchen BIOTIN PO Take 5,000 mg by  mouth 2 (two) times a week.       . Calcium Carbonate-Vit D-Min (CALTRATE PLUS PO) Take 600 mg by mouth 2 (two) times daily.       . Multiple Vitamins-Minerals (CENTRUM PO) Take by mouth daily.        . potassium chloride SA (K-DUR,KLOR-CON) 20 MEQ tablet Take 20 mEq by mouth 2 (two) times daily.      . propranolol (INDERAL) 20 MG tablet Take 20 mg by mouth 2 (two) times daily.       Marland Kitchen triamterene-hydrochlorothiazide (MAXZIDE) 75-50 MG per tablet Take 0.5 tablets by mouth daily.       Marland Kitchen VAGIFEM 10 MCG TABS vaginal tablet USE VAGINALLY 3 TIMES WEEKLY AS NEEDED & AS DIRECTED  16 tablet  7  . valsartan (DIOVAN) 80 MG tablet Take 80 mg by mouth daily.        . verapamil (CALAN-SR) 180 MG CR tablet Take 180 mg by mouth at bedtime.       No current facility-administered medications for this visit.   Facility-Administered Medications Ordered in Other Visits  Medication Dose Route Frequency Provider Last Rate Last Dose  . topical emolient (BIAFINE) emulsion   Topical PRN Lurline Hare, MD        OBJECTIVE: Older white woman in no acute distress Filed Vitals:   02/23/13 1105  BP: 131/73  Pulse: 60  Temp: 98.3 F (36.8 C)  Resp: 20     Body mass index is 31.51 kg/(m^2).    ECOG FS: 1  Sclerae unicteric, pupils equal round and reactive Oropharynx no thrush or other lesions No cervical or supraclavicular  adenopathy Lungs no rales or rhonchi Heart regular rate and rhythm Abd soft, nontender, positive bowel sounds MSK no focal spinal tenderness, no upper extremity lymphedema Neuro: non-focal, well-oriented, positive affect Breasts: The right breast is unremarkable. The left breast is status post lumpectomy and radiation. There is no evidence of local recurrence. The cosmetic result is good. The left axilla is benign.   LAB RESULTS:  CMP     Component Value Date/Time   NA 140 11/04/2012 0821   NA 139 10/24/2007 0440   K 3.8 11/04/2012 0821   K 3.5 10/24/2007 0440   CL 102 10/24/2007 0440   CO2 29 11/04/2012 0821   CO2 27 10/24/2007 0440   GLUCOSE 83 11/04/2012 0821   GLUCOSE 100* 10/24/2007 0440   BUN 18.8 11/04/2012 0821   BUN 17 10/24/2007 0440   CREATININE 0.9 11/04/2012 0821   CREATININE 1.03 10/24/2007 0440   CALCIUM 9.8 11/04/2012 0821   CALCIUM 8.8 10/24/2007 0440   PROT 7.1 11/04/2012 0821   PROT 5.7* 10/21/2007 0345   ALBUMIN 3.8 11/04/2012 0821   ALBUMIN 3.1* 10/21/2007 0345   AST 22 11/04/2012 0821   AST 29 10/21/2007 0345   ALT 24 11/04/2012 0821   ALT 20 10/21/2007 0345   ALKPHOS 98 11/04/2012 0821   ALKPHOS 65 10/21/2007 0345   BILITOT 0.46 11/04/2012 0821   BILITOT 0.6 10/21/2007 0345   GFRNONAA 53* 10/24/2007 0440   GFRAA  Value: >60        The eGFR has been calculated using the MDRD equation. This calculation has not been validated in all clinical 10/24/2007 0440    I No results found for this basename: SPEP,  UPEP,   kappa and lambda light chains    Lab Results  Component Value Date   WBC 5.6 02/23/2013   NEUTROABS 3.3 02/23/2013  HGB 12.6 02/23/2013   HCT 38.2 02/23/2013   MCV 86.2 02/23/2013   PLT 212 02/23/2013      Chemistry      Component Value Date/Time   NA 140 11/04/2012 0821   NA 139 10/24/2007 0440   K 3.8 11/04/2012 0821   K 3.5 10/24/2007 0440   CL 102 10/24/2007 0440   CO2 29 11/04/2012 0821   CO2 27 10/24/2007 0440   BUN 18.8 11/04/2012 0821   BUN 17 10/24/2007 0440   CREATININE  0.9 11/04/2012 0821   CREATININE 1.03 10/24/2007 0440      Component Value Date/Time   CALCIUM 9.8 11/04/2012 0821   CALCIUM 8.8 10/24/2007 0440   ALKPHOS 98 11/04/2012 0821   ALKPHOS 65 10/21/2007 0345   AST 22 11/04/2012 0821   AST 29 10/21/2007 0345   ALT 24 11/04/2012 0821   ALT 20 10/21/2007 0345   BILITOT 0.46 11/04/2012 0821   BILITOT 0.6 10/21/2007 0345       No results found for this basename: LABCA2    No components found with this basename: LABCA125    No results found for this basename: INR,  in the last 168 hours  Urinalysis    Component Value Date/Time   COLORURINE YELLOW 10/15/2011 1000   APPEARANCEUR CLEAR 10/15/2011 1000   LABSPEC 1.014 10/15/2011 1000   PHURINE 7.5 10/15/2011 1000   GLUCOSEU NEG 10/15/2011 1000   HGBUR NEG 10/15/2011 1000   BILIRUBINUR NEG 10/15/2011 1000   KETONESUR NEG 10/15/2011 1000   PROTEINUR NEG 10/15/2011 1000   UROBILINOGEN 1 10/15/2011 1000   NITRITE NEG 10/15/2011 1000   LEUKOCYTESUR NEG 10/15/2011 1000    STUDIES: No results found.  ASSESSMENT: 72 y.o. [place] woman status post upper inner quadrant left breast biopsy 10/28/2012 for a clinical T1 N0, stage IA invasive ductal carcinoma, grade 2, estrogen receptor 90% positive, progesterone receptor 10% positive, with no HER-2 amplification, and an MIB-1 of 20%  (1) Oncotype DX score of 21 predicts a distant recurrence risk within 10 years of 13% if the patient's only systemic treatment is tamoxifen for 5 years  (2) status post left lumpectomy and sentinel lymph node sampling in 11/18/2012 for a P.T2 pN0, stage IIA invasive ductal carcinoma, grade 1,   (3) status post margin clearance 12/10/2012 with a microscopic focus of invasive ductal carcinoma found at adjacent to the prior biopsy cavity, and DCIS within 1 mm of the final margin   (4) completed radiation therapy 02/09/2013  (5) osteopenia, with T -1.22 October 2012 at the [facility]  (6) anastrozole started 02/24/2013  PLAN: Annah is  now ready to start her antiestrogen therapy. We spent the better part of today's 40 minute visit discussing the difference between tamoxifen and anastrozole. She tells me she took birth control pills briefly because she developed a clot. Accordingly we are going to stay away from tamoxifen. We talked about the fact that she will not be able to use the Vagifem suppositories while on anastrozole, but we do have a new program her regarding vaginal dryness for patients like her and I gave her the appropriate in formation.  She will start the anastrozole now she'll see [place] again in 3 months. If she is tolerating it well the plan will be to continue that for 5 years.  She had a very favorable bone density in July. I think it would be useful for her to resume a bisphosphonate, and we will probably go back to Fosamax  or Boniva, which you're she was on before, when she sees me again in February.  She knows to call for any problems that may develop before her next visit here Lowella Dell, MD   02/23/2013 11:15 AM

## 2013-02-24 DIAGNOSIS — Z1212 Encounter for screening for malignant neoplasm of rectum: Secondary | ICD-10-CM | POA: Diagnosis not present

## 2013-02-25 ENCOUNTER — Encounter: Payer: Medicare Other | Admitting: Physical Therapy

## 2013-02-25 ENCOUNTER — Other Ambulatory Visit: Payer: Self-pay

## 2013-03-12 ENCOUNTER — Ambulatory Visit
Admission: RE | Admit: 2013-03-12 | Discharge: 2013-03-12 | Disposition: A | Discharge status: Home / Self Care | Payer: Medicare Other | Source: Ambulatory Visit | Attending: Radiation Oncology | Admitting: Radiation Oncology

## 2013-03-12 VITALS — BP 146/65 | HR 72 | Temp 97.7°F | Ht 61.0 in | Wt 166.0 lb

## 2013-03-12 DIAGNOSIS — C50212 Malignant neoplasm of upper-inner quadrant of left female breast: Secondary | ICD-10-CM

## 2013-03-12 NOTE — Progress Notes (Signed)
Department of Radiation Oncology  Phone:  [phone] Fax:        [phone]   Name: Teresa Chapman MRN: [id number]  DOB: 06-14-1940  Date: 03/12/2013  Follow Up Visit Note  Diagnosis: T2 N0 left breast cancer  Summary and Interval since last radiation: One month after 50 gray completed 02/09/2013  Interval History: Teresa Chapman presents today for routine followup.  She is doing well. She is taking her Arimidex and tolerating that well. She has infrequent hot flashes and no joint aches. She has some itching. She is still using Biafine.  Allergies:  Allergies  Allergen Reactions  . Augmentin [Amoxicillin-Pot Clavulanate] Rash  . Codeine Rash    REACTION: hyperactivity, itching    Medications:  Current Outpatient Prescriptions  Medication Sig Dispense Refill  . acetaminophen (TYLENOL) 500 MG tablet Take 1,000 mg by mouth every 6 (six) hours as needed for pain.      Marland Kitchen anastrozole (ARIMIDEX) 1 MG tablet Take 1 tablet (1 mg total) by mouth daily.  90 tablet  12  . aspirin 81 MG tablet Take 81 mg by mouth daily.      Marland Kitchen atorvastatin (LIPITOR) 10 MG tablet Take 10 mg by mouth daily.        Marland Kitchen BIOTIN PO Take 5,000 mg by mouth 2 (two) times a week.       . Calcium Carbonate-Vit D-Min (CALTRATE PLUS PO) Take 600 mg by mouth 2 (two) times daily.       . Multiple Vitamins-Minerals (CENTRUM PO) Take by mouth daily.        . potassium chloride SA (K-DUR,KLOR-CON) 20 MEQ tablet Take 20 mEq by mouth 2 (two) times daily.      . propranolol (INDERAL) 20 MG tablet Take 20 mg by mouth 2 (two) times daily.       Marland Kitchen triamterene-hydrochlorothiazide (MAXZIDE) 75-50 MG per tablet Take 0.5 tablets by mouth daily.       . valsartan (DIOVAN) 80 MG tablet Take 80 mg by mouth daily.        . verapamil (CALAN-SR) 180 MG CR tablet Take 180 mg by mouth at bedtime.      Marland Kitchen VAGIFEM 10 MCG TABS vaginal tablet USE VAGINALLY 3 TIMES WEEKLY AS NEEDED & AS DIRECTED  16 tablet  7   No current facility-administered  medications for this encounter.   Facility-Administered Medications Ordered in Other Encounters  Medication Dose Route Frequency Provider Last Rate Last Dose  . topical emolient (BIAFINE) emulsion   Topical PRN Lurline Hare, MD        Physical Exam:  Filed Vitals:   03/12/13 1447  BP: 146/65  Pulse: 72  Temp: 97.7 F (36.5 C)   she has some pink skin at the top of her surgical incision as well as a palpable seroma cavity. The rest of her skin is healed up well. Her moles are slightly darker especially in the inferior aspect of the breast.  IMPRESSION: Teresa Chapman is a 72 y.o. female status post breast conservation with resolving acute effects of treatment  PLAN:  I encouraged her to continue close followup with Dr. Daneil Dolin not. We discussed annual mammograms 6-9 months after last radiation treatment. I told her it was fine to start using regular lotion on her breast. We discussed sun protection in the treated area. I encouraged her to contact me with any questions or concerns in the future. I will see her back on a when necessary basis and I released her from  followup with me.    Lurline Hare, MD

## 2013-03-12 NOTE — Progress Notes (Signed)
Teresa Chapman here for follow up after treatment to Teresa Chapman left breast.  She denies pain and fatigue.  She is taking Arimidex daily.  The skin on Teresa Chapman left breast is intact with a small pink area on the top of Teresa Chapman breast.  She reports that it itches a little bit.  Requested another tube of biafine.  Another tube given.

## 2013-04-08 ENCOUNTER — Telehealth: Payer: Self-pay | Admitting: *Deleted

## 2013-04-08 MED ORDER — VENLAFAXINE HCL ER 37.5 MG PO CP24: ORAL_CAPSULE | ORAL | Status: DC

## 2013-04-08 NOTE — Telephone Encounter (Signed)
Patient called stating that since starting the Arimidex she has had a drastic increase in hot flashes. She becomes very emotional and wants to cry all the time.  Spoke with Zollie Scale, PA, we can try patient on Effexor. Instructed patient to start at 1cap at bedtime, may increase to bid if needed. Patient is not to stop suddenly. Call us with any side effects or before stopping this drug. Patient verbalized understanding.

## 2013-04-09 ENCOUNTER — Encounter: Payer: Self-pay | Admitting: *Deleted

## 2013-04-09 NOTE — Progress Notes (Signed)
CALLED [employer] [phone] AND IT HAS GONE TO REVIEW I SPOKE WITH  NABINE WE MAY ONLY GET 53 APPROVED SINCE SHE WILL TAKE 1 PER DAY X 7 AND THEN 2 FOR 23 DAYS.  THERE IS A 24 -72 HOUR TURNAROUND TIME.

## 2013-04-09 NOTE — Progress Notes (Signed)
RECEIVED A FAX FROM [facility] CONCERNING A PRIOR AUTHORIZATION FOR VENLAFAXINE HCL ER. THIS REQUEST WAS PLACED IN THE MANAGED CARE BIN.

## 2013-05-18 ENCOUNTER — Telehealth (INDEPENDENT_AMBULATORY_CARE_PROVIDER_SITE_OTHER): Payer: Self-pay

## 2013-05-18 DIAGNOSIS — C50912 Malignant neoplasm of unspecified site of left female breast: Secondary | ICD-10-CM

## 2013-05-18 NOTE — Telephone Encounter (Signed)
She signed up to do our study to avoid problems in the future and I encourage her to go to this follow up

## 2013-05-18 NOTE — Telephone Encounter (Signed)
Called Teresa Chapman to notify her that I placed an order in epic for Teresa Chapman [facility] to be contacting her to set up an appt. The Teresa Chapman stated that she already saw [facility] after surgery and she is doing just fine now. The Teresa Chapman did mention that Inez Catalina said something about a 63mo f/u and I advised Teresa Chapman that this would 28mo. I asked for the Teresa Chapman to just speak with [facility] [employer] and see if the Teresa Chapman even needs to be seen. The Teresa Chapman is fine with speaking with Inez Catalina again to see if she will need appt. I will notify Dr Donne Hazel.

## 2013-05-20 ENCOUNTER — Ambulatory Visit: Payer: Medicare Other | Attending: General Surgery | Admitting: Physical Therapy

## 2013-05-20 DIAGNOSIS — M4000 Postural kyphosis, site unspecified: Secondary | ICD-10-CM | POA: Insufficient documentation

## 2013-05-20 DIAGNOSIS — IMO0001 Reserved for inherently not codable concepts without codable children: Secondary | ICD-10-CM | POA: Diagnosis not present

## 2013-05-20 DIAGNOSIS — C50919 Malignant neoplasm of unspecified site of unspecified female breast: Secondary | ICD-10-CM | POA: Insufficient documentation

## 2013-05-20 DIAGNOSIS — R293 Abnormal posture: Secondary | ICD-10-CM | POA: Insufficient documentation

## 2013-05-20 DIAGNOSIS — Z01818 Encounter for other preprocedural examination: Secondary | ICD-10-CM | POA: Insufficient documentation

## 2013-05-27 ENCOUNTER — Ambulatory Visit (HOSPITAL_BASED_OUTPATIENT_CLINIC_OR_DEPARTMENT_OTHER): Payer: Medicare Other | Admitting: Oncology

## 2013-05-27 ENCOUNTER — Telehealth: Payer: Self-pay | Admitting: *Deleted

## 2013-05-27 ENCOUNTER — Other Ambulatory Visit (HOSPITAL_BASED_OUTPATIENT_CLINIC_OR_DEPARTMENT_OTHER): Payer: Medicare Other

## 2013-05-27 VITALS — BP 149/70 | HR 57 | Temp 97.9°F | Resp 18 | Ht 61.0 in | Wt 165.2 lb

## 2013-05-27 DIAGNOSIS — I10 Essential (primary) hypertension: Secondary | ICD-10-CM

## 2013-05-27 DIAGNOSIS — F3289 Other specified depressive episodes: Secondary | ICD-10-CM | POA: Diagnosis not present

## 2013-05-27 DIAGNOSIS — M8580 Other specified disorders of bone density and structure, unspecified site: Secondary | ICD-10-CM

## 2013-05-27 DIAGNOSIS — R002 Palpitations: Secondary | ICD-10-CM

## 2013-05-27 DIAGNOSIS — M949 Disorder of cartilage, unspecified: Secondary | ICD-10-CM

## 2013-05-27 DIAGNOSIS — C50219 Malignant neoplasm of upper-inner quadrant of unspecified female breast: Secondary | ICD-10-CM | POA: Diagnosis not present

## 2013-05-27 DIAGNOSIS — F411 Generalized anxiety disorder: Secondary | ICD-10-CM | POA: Diagnosis not present

## 2013-05-27 DIAGNOSIS — F329 Major depressive disorder, single episode, unspecified: Secondary | ICD-10-CM | POA: Diagnosis not present

## 2013-05-27 DIAGNOSIS — M899 Disorder of bone, unspecified: Secondary | ICD-10-CM

## 2013-05-27 DIAGNOSIS — C50212 Malignant neoplasm of upper-inner quadrant of left female breast: Secondary | ICD-10-CM

## 2013-05-27 DIAGNOSIS — N393 Stress incontinence (female) (male): Secondary | ICD-10-CM | POA: Diagnosis not present

## 2013-05-27 LAB — CBC WITH DIFFERENTIAL/PLATELET
BASO%: 0.3 % (ref 0.0–2.0)
Basophils Absolute: 0 10*3/uL (ref 0.0–0.1)
EOS%: 1.7 % (ref 0.0–7.0)
Eosinophils Absolute: 0.1 10*3/uL (ref 0.0–0.5)
HEMATOCRIT: 39 % (ref 34.8–46.6)
HGB: 13 g/dL (ref 11.6–15.9)
LYMPH%: 22 % (ref 14.0–49.7)
MCH: 29.2 pg (ref 25.1–34.0)
MCHC: 33.3 g/dL (ref 31.5–36.0)
MCV: 87.6 fL (ref 79.5–101.0)
MONO#: 0.5 10*3/uL (ref 0.1–0.9)
MONO%: 8.6 % (ref 0.0–14.0)
NEUT#: 4 10*3/uL (ref 1.5–6.5)
NEUT%: 67.4 % (ref 38.4–76.8)
PLATELETS: 220 10*3/uL (ref 145–400)
RBC: 4.45 10*6/uL (ref 3.70–5.45)
RDW: 14.9 % — ABNORMAL HIGH (ref 11.2–14.5)
WBC: 6 10*3/uL (ref 3.9–10.3)
lymph#: 1.3 10*3/uL (ref 0.9–3.3)

## 2013-05-27 MED ORDER — GABAPENTIN 300 MG PO CAPS: 300.0000 mg | ORAL_CAPSULE | Freq: Every day | ORAL | Status: DC

## 2013-05-27 MED ORDER — VENLAFAXINE HCL 37.5 MG PO TABS: 37.5000 mg | ORAL_TABLET | Freq: Two times a day (BID) | ORAL | Status: DC

## 2013-05-27 NOTE — Progress Notes (Signed)
Patient ID: Teresa Chapman, female   DOB: [birth year], 73 y.o.   MRN: [id number] ID: Teresa Chapman OB: [birth year]  MR#: [id number]  IRW#:[id number]  PCP: Teresa Lopes, MD GYN:  Teresa Chapman SU: Teresa Chapman OTHER MD: Teresa Chapman, Teresa Chapman   HISTORY OF PRESENT ILLNESS: Teresa Chapman had bilateral screening mammography at the [facility] 10/06/2012 showing a possible mass in the left breast. Additional views showed that the right breast mass was a simple cyst. However spot compression views on the left showed an area of increased density in the upper inner quadrant. There was no palpable mass on physical exam. Ultrasound however confirmed a hypoechoic irregular mass in the left breast measuring 1.3 cm. Biopsy of this mass 10/28/2012 showed an invasive ductal carcinoma, grade 2, estrogen receptor 90% positive, progesterone receptor 10% positive, with an MIB-1 of 20% and no HER-2 amplification.  On 11/03/2012 the patient underwent bilateral breast MRI this showed only the left breast mass in question, which measured 1.9 cm on this modality. There were no abnormal appearing lymph nodes.  The patient's subsequent history is as detailed below  INTERVAL HISTORY: Teresa Chapman returns today for followup of her breast cancer accompanied by her husband Teresa Chapman. Since her last visit here she started anastrozole. She is not having significant problems with vaginal dryness or arthralgias/myalgias, but the hot flashes are for (", worse at night, but also during the day  REVIEW OF SYSTEMS: She has a variety of chronic issues including of palpitations, shortness of breath, dry cough, stress urinary incontinence, and some anxiety and depression. Those are stable. She's developed a little bit of pain in her left upper back. She is working with our physical therapy folks on that N. she has been advised to wear a sleeve when exercising. She is having insomnia chiefly because of the hot flashes. Otherwise a detailed review of  systems today was noncontributory  PAST MEDICAL HISTORY: Past Medical History  Diagnosis Date  . Osteopenia   . Atrophic vaginitis   . Hot flashes   . Hyperlipidemia     takes Atorvastatin daily  . Hypertension     takes Inderal,Maxzide,and Diovan daily  . PONV (postoperative nausea and vomiting)   . Myocardial bridge     intra  . History of bronchitis 04/23/2012  . Arthritis     right wrist and finger joints  . Scoliosis   . Osteoarthritis   . History of colon polyps   . Diverticulosis   . Urinary frequency   . Urinary urgency   . Stress incontinence   . History of blood transfusion 1971    "when my son was born"  (11/18/2012); no abnormal reaction noted  . Cataracts, bilateral     immature  . Insomnia     infreqently takes Lunesta  . History of shingles 60yr ago and in 2013  . Lung cancer 2009  . Breast cancer, left breast 10/2012  . Basal cell cancer     "right face; RLE" (11/18/2012)  . Squamous cell carcinoma     "abdomen" (11/18/2012)  . DVT (deep venous thrombosis)     "? right side; years and years ago" (11/18/2012)  . Exertional shortness of breath   . Hepatitis B 1970's  . Anxiety     "related to breast cancer; totally unexpected" (11/18/2012)  . Wears glasses   . Dysrhythmia     hx pvc  . Radiation 01/13/13-02/09/13    Left Breast    PAST SURGICAL HISTORY: Past Surgical  History  Procedure Laterality Date  . Lung removal, partial Right 2009    upper lobe  . Vaginal hysterectomy  1974  . Cholecystectomy    . Appendectomy    . Dilation and curettage of uterus    . Tonsillectomy and adenoidectomy    . Thymectomy  ~ 1994  . Lung biopsy Right 2009  . Colonoscopy      "I've had a few; last one was 2010" (11/18/2012)  . Cardiac catheterization  03/2010  . Basal cell carcinoma excision      "right face; RLE" (11/18/2012)  . Squamous cell carcinoma excision      "?abdomen" (11/18/2012)  . Breast biopsy Right ~ 1962    "benign" (11/18/2012)  . Breast biopsy  Right 10/2012    "benign cyst" (11/18/2012)  . Breast biopsy Left 10/2012  . Breast lumpectomy Left 11/18/2012    "w/sentinel node bx" (11/18/2012)  . Breast lumpectomy with needle localization and axillary sentinel lymph node bx Left 11/18/2012    Procedure: LEFT BREAST LUMPECTOMY WITH NEEDLE LOCALIZATION AND AXILLARY SENTINEL LYMPH NODE BX;  Surgeon: Teresa Bookbinder, MD;  Location: [facility];  Service: General;  Laterality: Left;  . Re-excision of breast lumpectomy Left 12/10/2012    Procedure: RE-EXCISION OF BREAST LUMPECTOMY;  Surgeon: Teresa Bookbinder, MD;  Location: [facility];  Service: General;  Laterality: Left;    FAMILY HISTORY Family History  Problem Relation Age of Onset  . Hypertension Mother   . Colon cancer Mother   . Breast cancer Mother     Age late 85's  . Heart disease Father    the patient's father died at the age of 34 from a myocardial infarction. The patient's mother died at the age of 68 from Alzheimer's disease. She had been diagnosed with colon cancer at the age of 54 and breast cancer at the age of 91. The patient had one brother who died at birth. No sisters. There is no history of ovarian cancer in the family.  GYNECOLOGIC HISTORY:  Menarche age 72, menopause in her early 56s. She tells me she took birth control pills briefly and developed a clot. She took hormone replacement approximately 10 years, however, without complications.. The patient is GX P2, with first live birth at age 68.  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 Karsten Fells. Her husband Teresa Chapman is a retired Programmer, applications. Son Teresa Chapman lives in [place] where he works in Engineer, technical sales. Son Teresa Chapman lives in [place] where he is a Tree surgeon. The patient has 3 grandchildren. She attends the [name]     ADVANCED DIRECTIVES: In place   HEALTH  MAINTENANCE: History  Substance Use Topics  . Smoking status: Never Smoker   . Smokeless tobacco: Never Used  . Alcohol Use: 0.0 oz/week     Comment: 11/18/2012 "glass of wine twice/month"     Colonoscopy:  PAP:  Bone density: July 2014  Lipid panel:  Allergies  Allergen Reactions  . Augmentin [Amoxicillin-Pot Clavulanate] Rash  . Codeine Rash    REACTION: hyperactivity, itching    Current Outpatient Prescriptions  Medication Sig Dispense Refill  . acetaminophen (TYLENOL) 500 MG tablet Take 1,000 mg by mouth every 6 (six) hours as needed for pain.      Marland Kitchen anastrozole (ARIMIDEX) 1 MG tablet Take 1 tablet (1 mg total) by mouth daily.  Superior  tablet  12  . aspirin 81 MG tablet Take 81 mg by mouth daily.      Marland Kitchen atorvastatin (LIPITOR) 10 MG tablet Take 10 mg by mouth daily.        Marland Kitchen BIOTIN PO Take 5,000 mg by mouth 2 (two) times a week.       . Calcium Carbonate-Vit D-Min (CALTRATE PLUS PO) Take 600 mg by mouth 2 (two) times daily.       . Multiple Vitamins-Minerals (CENTRUM PO) Take by mouth daily.        . potassium chloride SA (K-DUR,KLOR-CON) 20 MEQ tablet Take 20 mEq by mouth 2 (two) times daily.      . propranolol (INDERAL) 20 MG tablet Take 20 mg by mouth 2 (two) times daily.       Marland Kitchen triamterene-hydrochlorothiazide (MAXZIDE) 75-50 MG per tablet Take 0.5 tablets by mouth daily.       Marland Kitchen VAGIFEM 10 MCG TABS vaginal tablet USE VAGINALLY 3 TIMES WEEKLY AS NEEDED & AS DIRECTED  16 tablet  7  . valsartan (DIOVAN) 80 MG tablet Take 80 mg by mouth daily.        Marland Kitchen venlafaxine XR (EFFEXOR-XR) 37.5 MG 24 hr capsule Take 1 po qhs x 7 days then may increase to bid  60 capsule  1  . verapamil (CALAN-SR) 180 MG CR tablet Take 180 mg by mouth at bedtime.       No current facility-administered medications for this visit.   Facility-Administered Medications Ordered in Other Visits  Medication Dose Route Frequency Provider Last Rate Last Dose  . topical emolient (BIAFINE) emulsion   Topical PRN  Teresa Silversmith, MD        OBJECTIVE: Older white woman who appears stated age 6 Vitals:   05/27/13 1035  BP: 149/70  Pulse: 57  Temp: 97.9 F (36.6 C)  Resp: 18     Body mass index is 31.23 kg/(m^2).    ECOG FS: 1  Sclerae unicteric, pupils equal and round Oropharynx no thrush or other lesions No cervical or supraclavicular adenopathy Lungs no rales or rhonchi Heart regular rate and rhythm Abd soft, obese, nontender, positive bowel sounds MSK no focal spinal tenderness, no upper extremity lymphedema, no abnormality in the left posterior shoulder to palpation and inspection Neuro: non-focal, well-oriented, positive affect with no obvious evidence of depression Breasts: The right breast is unremarkable. The left breast is status post lumpectomy and radiation. There is no evidence of local recurrence. The cosmetic result is good. The left axilla is benign.   LAB RESULTS:  CMP     Component Value Date/Time   NA 142 02/23/2013 1053   NA 139 10/24/2007 0440   K 4.2 02/23/2013 1053   K 3.5 10/24/2007 0440   CL 102 10/24/2007 0440   CO2 25 02/23/2013 1053   CO2 27 10/24/2007 0440   GLUCOSE 85 02/23/2013 1053   GLUCOSE 100* 10/24/2007 0440   BUN 16.3 02/23/2013 1053   BUN 17 10/24/2007 0440   CREATININE 0.9 02/23/2013 1053   CREATININE 1.03 10/24/2007 0440   CALCIUM 10.3 02/23/2013 1053   CALCIUM 8.8 10/24/2007 0440   PROT 7.1 02/23/2013 1053   PROT 5.7* 10/21/2007 0345   ALBUMIN 3.7 02/23/2013 1053   ALBUMIN 3.1* 10/21/2007 0345   AST 23 02/23/2013 1053   AST 29 10/21/2007 0345   ALT 19 02/23/2013 1053   ALT 20 10/21/2007 0345   ALKPHOS 95 02/23/2013 1053   ALKPHOS 65 10/21/2007 0345  BILITOT 0.44 02/23/2013 1053   BILITOT 0.6 10/21/2007 0345   GFRNONAA 53* 10/24/2007 0440   GFRAA  Value: >60        The eGFR has been calculated using the MDRD equation. This calculation has not been validated in all clinical 10/24/2007 0440    I No results found for this basename: SPEP,  UPEP,   kappa and lambda light  chains    Lab Results  Component Value Date   WBC 6.0 05/27/2013   NEUTROABS 4.0 05/27/2013   HGB 13.0 05/27/2013   HCT 39.0 05/27/2013   MCV 87.6 05/27/2013   PLT 220 05/27/2013      Chemistry      Component Value Date/Time   NA 142 02/23/2013 1053   NA 139 10/24/2007 0440   K 4.2 02/23/2013 1053   K 3.5 10/24/2007 0440   CL 102 10/24/2007 0440   CO2 25 02/23/2013 1053   CO2 27 10/24/2007 0440   BUN 16.3 02/23/2013 1053   BUN 17 10/24/2007 0440   CREATININE 0.9 02/23/2013 1053   CREATININE 1.03 10/24/2007 0440      Component Value Date/Time   CALCIUM 10.3 02/23/2013 1053   CALCIUM 8.8 10/24/2007 0440   ALKPHOS 95 02/23/2013 1053   ALKPHOS 65 10/21/2007 0345   AST 23 02/23/2013 1053   AST 29 10/21/2007 0345   ALT 19 02/23/2013 1053   ALT 20 10/21/2007 0345   BILITOT 0.44 02/23/2013 1053   BILITOT 0.6 10/21/2007 0345       No results found for this basename: LABCA2    No components found with this basename: LABCA125    No results found for this basename: INR,  in the last 168 hours  Urinalysis    Component Value Date/Time   COLORURINE YELLOW 10/15/2011 1000   APPEARANCEUR CLEAR 10/15/2011 1000   LABSPEC 1.014 10/15/2011 1000   PHURINE 7.5 10/15/2011 1000   GLUCOSEU NEG 10/15/2011 1000   HGBUR NEG 10/15/2011 1000   BILIRUBINUR NEG 10/15/2011 1000   KETONESUR NEG 10/15/2011 1000   PROTEINUR NEG 10/15/2011 1000   UROBILINOGEN 1 10/15/2011 1000   NITRITE NEG 10/15/2011 1000   LEUKOCYTESUR NEG 10/15/2011 1000    STUDIES: No results found.  ASSESSMENT: 73 y.o. [place] woman status post upper inner quadrant left breast biopsy 10/28/2012 for a clinical T1 N0, stage IA invasive ductal carcinoma, grade 2, estrogen receptor 90% positive, progesterone receptor 10% positive, with no HER-2 amplification, and an MIB-1 of 20%  (1) Oncotype DX score of 21 predicts a distant recurrence risk within 10 years of 13% if the patient's only systemic treatment is tamoxifen for 5 years  (2) status post left lumpectomy  and sentinel lymph node sampling in 11/18/2012 for a pT2 pN0, stage IIA invasive ductal carcinoma, grade 1,   (3) status post margin clearance 12/10/2012 with a microscopic focus of invasive ductal carcinoma found adjacent to the prior biopsy cavity, and DCIS within 1 mm of the final margin   (4) completed radiation therapy 02/09/2013  (5) osteopenia, with T -1.22 October 2012 at the [facility]  (6) anastrozole started 02/24/2013  PLAN: We spent approximately 40 minutes going over her situation. I do not know why her pharmacy 1 in the chart so much for the venlafaxine. I have rewritten a prescription for the generic form, 37.5 mg twice daily, and she will take that the [facility], where she is a member. She has a good understanding of the possible toxicities, side effects and  complications. If she does well with this then a week later she will start gabapentin 300 mg at bedtime. She understands not to take the gabapentin during the day otherwise it may make her too sleepy.  I think with these small changes she may be able to tolerate the anastrozole without the problems with hot flashes. I'm making her a return appointment here in 3 months just to make sure everything is working well, and if so we will start seeing her on an every 6 month basis.  I again encouraged her to continue her walking program. She understands this is particularly important because we don't want her bones to become "weak" from the lack of estrogen. Of course walking also helps a hard, helps the mood, and in itself can do a small degree reduce the risk of a new breast cancer developing.  Teresa Chapman has a good understanding of all this. She knows to call for any problems that may develop before next visit here  \   Chauncey Cruel, MD   05/27/2013 11:01 AM

## 2013-05-27 NOTE — Telephone Encounter (Signed)
appts made and printed...td 

## 2013-05-27 NOTE — Addendum Note (Signed)
Addended by: Laureen Abrahams on: 05/27/2013 05:29 PM   Modules accepted: Orders

## 2013-07-12 DIAGNOSIS — R82998 Other abnormal findings in urine: Secondary | ICD-10-CM | POA: Diagnosis not present

## 2013-07-12 DIAGNOSIS — R300 Dysuria: Secondary | ICD-10-CM | POA: Diagnosis not present

## 2013-08-17 ENCOUNTER — Encounter (INDEPENDENT_AMBULATORY_CARE_PROVIDER_SITE_OTHER): Payer: Self-pay | Admitting: General Surgery

## 2013-08-17 ENCOUNTER — Ambulatory Visit (INDEPENDENT_AMBULATORY_CARE_PROVIDER_SITE_OTHER): Payer: Medicare Other | Admitting: General Surgery

## 2013-08-17 VITALS — BP 126/78 | HR 72 | Temp 97.5°F | Ht 61.0 in | Wt 162.6 lb

## 2013-08-17 DIAGNOSIS — C50219 Malignant neoplasm of upper-inner quadrant of unspecified female breast: Secondary | ICD-10-CM | POA: Diagnosis not present

## 2013-08-17 DIAGNOSIS — C50212 Malignant neoplasm of upper-inner quadrant of left female breast: Secondary | ICD-10-CM

## 2013-08-17 NOTE — Progress Notes (Signed)
Subjective:     Patient ID: Teresa Chapman, female   DOB: 1940/04/26, 73 y.o.   MRN: [id number]  HPI 58 yof who underwent left lumpectomy/snbx (had a re-excision) for stage II left breast cancer then treated with radiotherapy and then now on antiestrogen.  She was started on effexor and recommended neurontin.  She is taking effexor only in am and it is helping.  She did not want to take at night also.  Not taking neurontin now. She is having tolerable mild hot flashes during the day but is waking up several times at night.  She notes no concerns about her sbe, no masses, no discharge. She and her husband Jeanell Sparrow are seeing a trainer a couple times a week and they are walking several other times.  Review of Systems     Objective:   Physical Exam  Vitals reviewed. Constitutional: She appears well-developed and well-nourished.  Neck: Neck supple.  Pulmonary/Chest: Right breast exhibits no inverted nipple, no mass, no nipple discharge, no skin change and no tenderness. Left breast exhibits inverted nipple. Left breast exhibits no mass, no nipple discharge, no skin change and no tenderness.    Lymphadenopathy:    She has no cervical adenopathy.    She has no axillary adenopathy.       Right: No supraclavicular adenopathy present.       Left: No supraclavicular adenopathy present.       Assessment:     Stage II left breast cancer    Plan:     She has no clinical evidence of recurrence.She will get her mm in July, continue self exams. She will see Dr Jana Hakim to follow up.  I will schedule a follow up 6 months after that.   I recommended that she take her effexor twice daily to start to see if this is better for her sleep.

## 2013-08-30 ENCOUNTER — Other Ambulatory Visit: Payer: Self-pay | Admitting: *Deleted

## 2013-08-30 DIAGNOSIS — C50212 Malignant neoplasm of upper-inner quadrant of left female breast: Secondary | ICD-10-CM

## 2013-08-31 ENCOUNTER — Ambulatory Visit (HOSPITAL_BASED_OUTPATIENT_CLINIC_OR_DEPARTMENT_OTHER): Payer: Medicare Other | Admitting: Physician Assistant

## 2013-08-31 ENCOUNTER — Telehealth: Payer: Self-pay | Admitting: Oncology

## 2013-08-31 ENCOUNTER — Other Ambulatory Visit (HOSPITAL_BASED_OUTPATIENT_CLINIC_OR_DEPARTMENT_OTHER): Payer: Medicare Other

## 2013-08-31 VITALS — BP 132/69 | HR 65 | Temp 98.7°F | Resp 18 | Ht 61.0 in | Wt 162.6 lb

## 2013-08-31 DIAGNOSIS — M949 Disorder of cartilage, unspecified: Secondary | ICD-10-CM

## 2013-08-31 DIAGNOSIS — Z170 Estrogen receptor positive status [ER+]: Secondary | ICD-10-CM | POA: Diagnosis not present

## 2013-08-31 DIAGNOSIS — C50212 Malignant neoplasm of upper-inner quadrant of left female breast: Secondary | ICD-10-CM

## 2013-08-31 DIAGNOSIS — M899 Disorder of bone, unspecified: Secondary | ICD-10-CM | POA: Diagnosis not present

## 2013-08-31 DIAGNOSIS — G4700 Insomnia, unspecified: Secondary | ICD-10-CM | POA: Diagnosis not present

## 2013-08-31 DIAGNOSIS — C50219 Malignant neoplasm of upper-inner quadrant of unspecified female breast: Secondary | ICD-10-CM

## 2013-08-31 DIAGNOSIS — F341 Dysthymic disorder: Secondary | ICD-10-CM | POA: Diagnosis not present

## 2013-08-31 LAB — COMPREHENSIVE METABOLIC PANEL (CC13)
ALBUMIN: 4 g/dL (ref 3.5–5.0)
ALT: 22 U/L (ref 0–55)
ANION GAP: 10 meq/L (ref 3–11)
AST: 22 U/L (ref 5–34)
Alkaline Phosphatase: 108 U/L (ref 40–150)
BUN: 19.1 mg/dL (ref 7.0–26.0)
CALCIUM: 10.4 mg/dL (ref 8.4–10.4)
CO2: 27 meq/L (ref 22–29)
CREATININE: 1 mg/dL (ref 0.6–1.1)
Chloride: 105 mEq/L (ref 98–109)
GLUCOSE: 108 mg/dL (ref 70–140)
POTASSIUM: 4.7 meq/L (ref 3.5–5.1)
Sodium: 142 mEq/L (ref 136–145)
Total Bilirubin: 0.41 mg/dL (ref 0.20–1.20)
Total Protein: 7.2 g/dL (ref 6.4–8.3)

## 2013-08-31 LAB — CBC WITH DIFFERENTIAL/PLATELET
BASO%: 0.2 % (ref 0.0–2.0)
Basophils Absolute: 0 10*3/uL (ref 0.0–0.1)
EOS%: 3.3 % (ref 0.0–7.0)
Eosinophils Absolute: 0.2 10*3/uL (ref 0.0–0.5)
HCT: 38.7 % (ref 34.8–46.6)
HGB: 12.9 g/dL (ref 11.6–15.9)
LYMPH#: 1.8 10*3/uL (ref 0.9–3.3)
LYMPH%: 29.5 % (ref 14.0–49.7)
MCH: 29.1 pg (ref 25.1–34.0)
MCHC: 33.3 g/dL (ref 31.5–36.0)
MCV: 87.2 fL (ref 79.5–101.0)
MONO#: 0.5 10*3/uL (ref 0.1–0.9)
MONO%: 8.9 % (ref 0.0–14.0)
NEUT#: 3.5 10*3/uL (ref 1.5–6.5)
NEUT%: 58.1 % (ref 38.4–76.8)
Platelets: 241 10*3/uL (ref 145–400)
RBC: 4.44 10*6/uL (ref 3.70–5.45)
RDW: 14.3 % (ref 11.2–14.5)
WBC: 6 10*3/uL (ref 3.9–10.3)

## 2013-08-31 NOTE — Telephone Encounter (Signed)
per pof to sch pt appt/mamma-gave pt copy of sch

## 2013-08-31 NOTE — Progress Notes (Signed)
Patient ID: Teresa Chapman, female   DOB: November 16, 1940, 73 y.o.   MRN: [id number] ID: Teresa Chapman OB: 11-Nov-1940  MR#: [id number]  MSX#:[id number]  PCP: Teresa Lopes, MD GYN:  Teresa Chapman SU: Teresa Chapman OTHER MD: Teresa Chapman, Teresa Chapman   HISTORY OF PRESENT ILLNESS: Teresa Chapman had bilateral screening mammography at the [facility] 10/06/2012 showing a possible mass in the left breast. Additional views showed that the right breast mass was a simple cyst. However spot compression views on the left showed an area of increased density in the upper inner quadrant. There was no palpable mass on physical exam. Ultrasound however confirmed a hypoechoic irregular mass in the left breast measuring 1.3 cm. Biopsy of this mass 10/28/2012 showed an invasive ductal carcinoma, grade 2, estrogen receptor 90% positive, progesterone receptor 10% positive, with an MIB-1 of 20% and no HER-2 amplification.  On 11/03/2012 the patient underwent bilateral breast MRI this showed only the left breast mass in question, which measured 1.9 cm on this modality. There were no abnormal appearing lymph nodes.  The patient's subsequent history is as detailed below  INTERVAL HISTORY: Teresa Chapman returns today for followup of her breast cancer accompanied by her husband Teresa. Since her last visit here she started effexor and is tolerating anastrozole. She is not having significant problems with vaginal dryness or arthralgias/myalgias, but the hot flashes are significantly better.   REVIEW OF SYSTEMS: She has a variety of chronic issues including of palpitations, shortness of breath, dry cough, stress urinary incontinence, and some anxiety and depression. Those are stable. She's developed a little bit of pain in her left upper back. She is working with our physical therapy folks on that and she continues to  wear a sleeve when exercising. Her insomnia is improved.  Otherwise a detailed review of systems today was  noncontributory  PAST MEDICAL HISTORY: Past Medical History  Diagnosis Date  . Osteopenia   . Atrophic vaginitis   . Hot flashes   . Hyperlipidemia     takes Atorvastatin daily  . Hypertension     takes Inderal,Maxzide,and Diovan daily  . PONV (postoperative nausea and vomiting)   . Myocardial bridge     intra  . History of bronchitis 04/23/2012  . Arthritis     right wrist and finger joints  . Scoliosis   . Osteoarthritis   . History of colon polyps   . Diverticulosis   . Urinary frequency   . Urinary urgency   . Stress incontinence   . History of blood transfusion 1971    "when my son was born"  (11/18/2012); no abnormal reaction noted  . Cataracts, bilateral     immature  . Insomnia     infreqently takes Lunesta  . History of shingles 58yr ago and in 2013  . Lung cancer 2009  . Breast cancer, left breast 10/2012  . Basal cell cancer     "right face; RLE" (11/18/2012)  . Squamous cell carcinoma     "abdomen" (11/18/2012)  . DVT (deep venous thrombosis)     "? right side; years and years ago" (11/18/2012)  . Exertional shortness of breath   . Hepatitis B 1970's  . Anxiety     "related to breast cancer; totally unexpected" (11/18/2012)  . Wears glasses   . Dysrhythmia     hx pvc  . Radiation 01/13/13-02/09/13    Left Breast    PAST SURGICAL HISTORY: Past Surgical History  Procedure Laterality Date  . Lung removal,  partial Right 2009    upper lobe  . Vaginal hysterectomy  1974  . Cholecystectomy    . Appendectomy    . Dilation and curettage of uterus    . Tonsillectomy and adenoidectomy    . Thymectomy  ~ 1994  . Lung biopsy Right 2009  . Colonoscopy      "I've had a few; last one was 2010" (11/18/2012)  . Cardiac catheterization  03/2010  . Basal cell carcinoma excision      "right face; RLE" (11/18/2012)  . Squamous cell carcinoma excision      "?abdomen" (11/18/2012)  . Breast biopsy Right ~ 1962    "benign" (11/18/2012)  . Breast biopsy Right 10/2012     "benign cyst" (11/18/2012)  . Breast biopsy Left 10/2012  . Breast lumpectomy Left 11/18/2012    "w/sentinel node bx" (11/18/2012)  . Breast lumpectomy with needle localization and axillary sentinel lymph node bx Left 11/18/2012    Procedure: LEFT BREAST LUMPECTOMY WITH NEEDLE LOCALIZATION AND AXILLARY SENTINEL LYMPH NODE BX;  Surgeon: Teresa Bookbinder, MD;  Location: [facility];  Service: General;  Laterality: Left;  . Re-excision of breast lumpectomy Left 12/10/2012    Procedure: RE-EXCISION OF BREAST LUMPECTOMY;  Surgeon: Teresa Bookbinder, MD;  Location: [facility];  Service: General;  Laterality: Left;    FAMILY HISTORY Family History  Problem Relation Age of Onset  . Hypertension Mother   . Colon cancer Mother   . Breast cancer Mother     Age late 34's  . Heart disease Father    the patient's father died at the age of 43 from a myocardial infarction. The patient's mother died at the age of 70 from Alzheimer's disease. She had been diagnosed with colon cancer at the age of 17 and breast cancer at the age of 14. The patient had one brother who died at birth. No sisters. There is no history of ovarian cancer in the family.  GYNECOLOGIC HISTORY:  Menarche age 64, menopause in her early 53s. She tells me she took birth control pills briefly and developed a clot. She took hormone replacement approximately 10 years, however, without complications.. The patient is GX P2, with first live birth at age 28.  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 Karsten Chapman. Her husband Rip Harbour "Teresa" Chapman is a retired Programmer, applications. Son Teresa Chapman lives in [place] where he works in Engineer, technical sales. Son Teresa Chapman lives in [place] where he is a Tree surgeon. The patient has 3 grandchildren. She attends the [facility] Chapman     ADVANCED DIRECTIVES: In place   HEALTH MAINTENANCE: History  Substance Use  Topics  . Smoking status: Never Smoker   . Smokeless tobacco: Never Used  . Alcohol Use: 0.0 oz/week     Comment: 11/18/2012 "glass of wine twice/month"     Colonoscopy:  PAP:  Bone density: July 2014  Lipid panel:  Allergies  Allergen Reactions  . Augmentin [Amoxicillin-Pot Clavulanate] Rash  . Codeine Rash    REACTION: hyperactivity, itching    Current Outpatient Prescriptions  Medication Sig Dispense Refill  . acetaminophen (TYLENOL) 500 MG tablet Take 1,000 mg by mouth every 6 (six) hours as needed for pain.      Marland Kitchen anastrozole (ARIMIDEX) 1 MG tablet Take 1 tablet (1 mg total) by mouth daily.  90 tablet  12  . aspirin 81 MG tablet  Take 81 mg by mouth daily.      Marland Kitchen atorvastatin (LIPITOR) 10 MG tablet Take 10 mg by mouth daily.        . Calcium Carbonate-Vit D-Min (CALTRATE PLUS PO) Take 600 mg by mouth 2 (two) times daily.       . Multiple Vitamins-Minerals (CENTRUM PO) Take by mouth daily.        . potassium chloride SA (K-DUR,KLOR-CON) 20 MEQ tablet Take 20 mEq by mouth 2 (two) times daily.      . propranolol (INDERAL) 20 MG tablet Take 20 mg by mouth 2 (two) times daily.       Marland Kitchen triamterene-hydrochlorothiazide (MAXZIDE) 75-50 MG per tablet Take 0.5 tablets by mouth daily.       . valsartan (DIOVAN) 80 MG tablet Take 80 mg by mouth daily.        Marland Kitchen venlafaxine (EFFEXOR) 37.5 MG tablet Take 37.5 mg by mouth 2 (two) times daily.      . verapamil (CALAN-SR) 180 MG CR tablet Take 180 mg by mouth at bedtime.       No current facility-administered medications for this visit.   Facility-Administered Medications Ordered in Other Visits  Medication Dose Route Frequency Provider Last Rate Last Dose  . topical emolient (BIAFINE) emulsion   Topical PRN Teresa Silversmith, MD        OBJECTIVE: Older white woman who appears stated age 85 Vitals:   08/31/13 1245  BP: 132/69  Pulse: 65  Temp: 98.7 F (37.1 C)  Resp: 18     Body mass index is 30.74 kg/(m^2).    ECOG FS: 1  Sclerae  unicteric, pupils equal and round Oropharynx no thrush or other lesions No cervical or supraclavicular adenopathy Lungs no rales or rhonchi Heart regular rate and rhythm Abd soft, obese, nontender, positive bowel sounds MSK no focal spinal tenderness, no upper extremity lymphedema, no abnormality in the left posterior shoulder to palpation and inspection Neuro: non-focal, well-oriented, positive affect with no obvious evidence of depression Breasts: The right breast is unremarkable. The left breast is status post lumpectomy and radiation. There is no evidence of local recurrence. The cosmetic result is good. The left axilla is benign. Skin: numerous keratoses noted to upper trunk   LAB RESULTS:  CMP     Component Value Date/Time   NA 142 08/31/2013 1213   NA 139 10/24/2007 0440   K 4.7 08/31/2013 1213   K 3.5 10/24/2007 0440   CL 102 10/24/2007 0440   CO2 27 08/31/2013 1213   CO2 27 10/24/2007 0440   GLUCOSE 108 08/31/2013 1213   GLUCOSE 100* 10/24/2007 0440   BUN 19.1 08/31/2013 1213   BUN 17 10/24/2007 0440   CREATININE 1.0 08/31/2013 1213   CREATININE 1.03 10/24/2007 0440   CALCIUM 10.4 08/31/2013 1213   CALCIUM 8.8 10/24/2007 0440   PROT 7.2 08/31/2013 1213   PROT 5.7* 10/21/2007 0345   ALBUMIN 4.0 08/31/2013 1213   ALBUMIN 3.1* 10/21/2007 0345   AST 22 08/31/2013 1213   AST 29 10/21/2007 0345   ALT 22 08/31/2013 1213   ALT 20 10/21/2007 0345   ALKPHOS 108 08/31/2013 1213   ALKPHOS 65 10/21/2007 0345   BILITOT 0.41 08/31/2013 1213   BILITOT 0.6 10/21/2007 0345   GFRNONAA 53* 10/24/2007 0440   GFRAA  Value: >60        The eGFR has been calculated using the MDRD equation. This calculation has not been validated in all clinical 10/24/2007 0440  I No results found for this basename: SPEP,  UPEP,   kappa and lambda light chains    Lab Results  Component Value Date   WBC 6.0 08/31/2013   NEUTROABS 3.5 08/31/2013   HGB 12.9 08/31/2013   HCT 38.7 08/31/2013   MCV 87.2 08/31/2013   PLT 241 08/31/2013       Chemistry      Component Value Date/Time   NA 142 08/31/2013 1213   NA 139 10/24/2007 0440   K 4.7 08/31/2013 1213   K 3.5 10/24/2007 0440   CL 102 10/24/2007 0440   CO2 27 08/31/2013 1213   CO2 27 10/24/2007 0440   BUN 19.1 08/31/2013 1213   BUN 17 10/24/2007 0440   CREATININE 1.0 08/31/2013 1213   CREATININE 1.03 10/24/2007 0440      Component Value Date/Time   CALCIUM 10.4 08/31/2013 1213   CALCIUM 8.8 10/24/2007 0440   ALKPHOS 108 08/31/2013 1213   ALKPHOS 65 10/21/2007 0345   AST 22 08/31/2013 1213   AST 29 10/21/2007 0345   ALT 22 08/31/2013 1213   ALT 20 10/21/2007 0345   BILITOT 0.41 08/31/2013 1213   BILITOT 0.6 10/21/2007 0345       No results found for this basename: LABCA2    No components found with this basename: LABCA125    No results found for this basename: INR,  in the last 168 hours  Urinalysis    Component Value Date/Time   COLORURINE YELLOW 10/15/2011 1000   APPEARANCEUR CLEAR 10/15/2011 1000   LABSPEC 1.014 10/15/2011 1000   PHURINE 7.5 10/15/2011 1000   GLUCOSEU NEG 10/15/2011 1000   HGBUR NEG 10/15/2011 1000   BILIRUBINUR NEG 10/15/2011 1000   KETONESUR NEG 10/15/2011 1000   PROTEINUR NEG 10/15/2011 1000   UROBILINOGEN 1 10/15/2011 1000   NITRITE NEG 10/15/2011 1000   LEUKOCYTESUR NEG 10/15/2011 1000    STUDIES: No results found.  ASSESSMENT: 73 y.o. [place] woman status post upper inner quadrant left breast biopsy 10/28/2012 for a clinical T1 N0, stage IA invasive ductal carcinoma, grade 2, estrogen receptor 90% positive, progesterone receptor 10% positive, with no HER-2 amplification, and an MIB-1 of 20%  (1) Oncotype DX score of 21 predicts a distant recurrence risk within 10 years of 13% if the patient's only systemic treatment is tamoxifen for 5 years  (2) status post left lumpectomy and sentinel lymph node sampling in 11/18/2012 for a pT2 pN0, stage IIA invasive ductal carcinoma, grade 1,   (3) status post margin clearance 12/10/2012 with a microscopic focus of  invasive ductal carcinoma found adjacent to the prior biopsy cavity, and DCIS within 1 mm of the final margin   (4) completed radiation therapy 02/09/2013  (5) osteopenia, with T -1.22 October 2012 at the [facility]  (6) anastrozole started 02/24/2013  PLAN:  She is doing so much better than her last visit. The venlafaxine is helping with her hot flashes. She is seeing her surgeon, Dr. Donne Hazel, this summer and we going to see her every 6 months. She continues to use her lymphedema sleeve. She is active and exercises with a trainer twice a week and works out with her husband an additional 2 times a week. She is continuing to be active and walk.   Teresa Chapman has a good understanding of all this. She knows to call for any problems that may develop before next visit here   Bill Salinas, NP, AOCNP   08/31/2013 4:06 PM

## 2013-10-05 DIAGNOSIS — M899 Disorder of bone, unspecified: Secondary | ICD-10-CM | POA: Diagnosis not present

## 2013-10-05 DIAGNOSIS — M949 Disorder of cartilage, unspecified: Secondary | ICD-10-CM | POA: Diagnosis not present

## 2013-10-05 DIAGNOSIS — M545 Low back pain, unspecified: Secondary | ICD-10-CM | POA: Diagnosis not present

## 2013-10-05 DIAGNOSIS — Z6830 Body mass index (BMI) 30.0-30.9, adult: Secondary | ICD-10-CM | POA: Diagnosis not present

## 2013-10-27 DIAGNOSIS — H2510 Age-related nuclear cataract, unspecified eye: Secondary | ICD-10-CM | POA: Diagnosis not present

## 2013-10-27 DIAGNOSIS — H35379 Puckering of macula, unspecified eye: Secondary | ICD-10-CM | POA: Diagnosis not present

## 2013-10-27 DIAGNOSIS — H524 Presbyopia: Secondary | ICD-10-CM | POA: Diagnosis not present

## 2013-11-01 ENCOUNTER — Ambulatory Visit
Admission: RE | Admit: 2013-11-01 | Discharge: 2013-11-01 | Disposition: A | Discharge status: Home / Self Care | Payer: Medicare Other | Source: Ambulatory Visit | Attending: Nurse Practitioner | Admitting: Nurse Practitioner

## 2013-11-01 DIAGNOSIS — C50212 Malignant neoplasm of upper-inner quadrant of left female breast: Secondary | ICD-10-CM

## 2013-11-01 DIAGNOSIS — Z853 Personal history of malignant neoplasm of breast: Secondary | ICD-10-CM | POA: Diagnosis not present

## 2013-11-01 DIAGNOSIS — R922 Inconclusive mammogram: Secondary | ICD-10-CM | POA: Diagnosis not present

## 2013-11-04 DIAGNOSIS — L821 Other seborrheic keratosis: Secondary | ICD-10-CM | POA: Diagnosis not present

## 2013-11-04 DIAGNOSIS — L820 Inflamed seborrheic keratosis: Secondary | ICD-10-CM | POA: Diagnosis not present

## 2013-11-04 DIAGNOSIS — D2370 Other benign neoplasm of skin of unspecified lower limb, including hip: Secondary | ICD-10-CM | POA: Diagnosis not present

## 2013-11-04 DIAGNOSIS — D239 Other benign neoplasm of skin, unspecified: Secondary | ICD-10-CM | POA: Diagnosis not present

## 2013-11-04 DIAGNOSIS — Z85828 Personal history of other malignant neoplasm of skin: Secondary | ICD-10-CM | POA: Diagnosis not present

## 2013-11-04 DIAGNOSIS — D2320 Other benign neoplasm of skin of unspecified ear and external auricular canal: Secondary | ICD-10-CM | POA: Diagnosis not present

## 2013-11-15 ENCOUNTER — Encounter: Payer: BLUE CROSS/BLUE SHIELD | Admitting: Gynecology

## 2013-11-19 ENCOUNTER — Encounter: Payer: BLUE CROSS/BLUE SHIELD | Admitting: Gynecology

## 2013-11-25 ENCOUNTER — Encounter: Payer: Self-pay | Admitting: Gastroenterology

## 2013-11-30 ENCOUNTER — Other Ambulatory Visit: Payer: Self-pay

## 2013-11-30 DIAGNOSIS — C50212 Malignant neoplasm of upper-inner quadrant of left female breast: Secondary | ICD-10-CM

## 2013-11-30 MED ORDER — VENLAFAXINE HCL 37.5 MG PO TABS: 37.5000 mg | ORAL_TABLET | Freq: Two times a day (BID) | ORAL | Status: DC

## 2013-11-30 NOTE — Telephone Encounter (Signed)
Per fax request from [facility] - refill sent for venlafaxine 37.5 dispense 60 refill 4.  Next appt Nov 12 with GM.  Receipt confirmed by pharmacy.

## 2013-12-01 ENCOUNTER — Other Ambulatory Visit: Payer: Self-pay | Admitting: *Deleted

## 2013-12-01 DIAGNOSIS — C50212 Malignant neoplasm of upper-inner quadrant of left female breast: Secondary | ICD-10-CM

## 2013-12-01 MED ORDER — VENLAFAXINE HCL 37.5 MG PO TABS: 37.5000 mg | ORAL_TABLET | Freq: Two times a day (BID) | ORAL | Status: DC

## 2013-12-01 NOTE — Telephone Encounter (Signed)
Patient called from [facility] has not received refill for effexor.  She would like this filled.  Order sent to [facility].  Will notify [facility] to fill this for patient today.

## 2013-12-22 ENCOUNTER — Encounter: Payer: BLUE CROSS/BLUE SHIELD | Admitting: Gynecology

## 2013-12-29 ENCOUNTER — Encounter: Payer: Self-pay | Admitting: Gastroenterology

## 2013-12-30 ENCOUNTER — Encounter: Payer: Self-pay | Admitting: Gynecology

## 2013-12-30 ENCOUNTER — Ambulatory Visit (INDEPENDENT_AMBULATORY_CARE_PROVIDER_SITE_OTHER): Payer: Medicare Other | Admitting: Gynecology

## 2013-12-30 VITALS — BP 120/70 | Ht 61.25 in | Wt 162.8 lb

## 2013-12-30 DIAGNOSIS — N951 Menopausal and female climacteric states: Secondary | ICD-10-CM

## 2013-12-30 NOTE — Progress Notes (Signed)
Teresa Chapman Oct 17, 1940 [id number]        73 y.o.  [id number] for follow up exam. Several issues that are below.  Past medical history,surgical history, problem list, medications, allergies, family history and social history were all reviewed and documented as reviewed in the EPIC chart.  ROS:  12 system ROS performed with pertinent positives and negatives included in the history, assessment and plan.   Additional significant findings :  none   Exam: Engineer, drilling Vitals:   12/30/13 1211  BP: 120/70  Height: 5' 1.25" (1.556 m)  Weight: 162 lb 12.8 oz (73.846 kg)   General appearance:  Normal affect, orientation and appearance. Skin: Grossly normal HEENT: Without gross lesions.  No cervical or supraclavicular adenopathy. Thyroid normal.  Lungs:  Clear without wheezing, rales or rhonchi Cardiac: RR, without RMG Abdominal:  Soft, nontender, without masses, guarding, rebound, organomegaly or hernia Breasts:  Examined lying and sitting without masses, retractions, discharge or axillary adenopathy.  Left breast with lumpectomy scar and retracted nipple. Pelvic:  Ext/BUS/vagina with atrophic changes  Adnexa  Without masses or tenderness    Anus and perineum  Normal   Rectovaginal  Normal sphincter tone without palpated masses or tenderness.    Assessment/Plan:  73 y.o. [id number] female for follow up exam.   1. Postmenopausal/atrophic genital changes. Had been on Vagifem previously but discontinued after her breast cancer diagnoses. Patient notes that she is actually doing okay without significant vaginal dryness symptoms. Will monitor at present and follow up if becomes an issue.  Patient is on Effexor 37.5 twice a day which is helping with some hot flashes that she is having. Possibility to increase the dose reviewed. She's going to discuss this with Dr. Jana Hakim at her appointment in November. 2. Pap smear 2012. No Pap smear done today. Reviewed current screening guidelines. Patient is  over the age of 30, status post hysterectomy for benign indications in no history of significant abnormal Pap smears. We both agree on stop screening and she is comfortable with this. 3. Breast cancer, left status post lumpectomy and radiation. Exam NED. Nipple retraction occurred after treatment. Continue with self breast exams monthly. Mammogram 10/2013 negative. Actively being followed by Dr. Jana Hakim. 4. Osteopenia.DEXA 10/2012 T score -1.3. We discussed this her 11/11/2012 office note. Appears overall stable from prior DEXA with history of his phosphate treatment in the past. Currently on drug-free holiday. Plan is to repeat bone density at two-year interval.  She is on anastrozole and may require reinitiation of treatment at some point. Increased calcium vitamin D reviewed. 5. Colonoscopy 2010. Patient reports that they want her to repeat this this coming year and she is in the process of arranging. 6. Health maintenance. No routine blood work done as this is done through her primary physician's office. Follow up one year, sooner as needed.   Note: This document was prepared with digital dictation and possible smart phrase technology. Any transcriptional errors that result from this process are unintentional.   Anastasio Auerbach MD, 12:43 PM 12/30/2013

## 2013-12-30 NOTE — Patient Instructions (Signed)
You may obtain a copy of any labs that were done today by logging onto MyChart as outlined in the instructions provided with your AVS (after visit summary). The office will not call with normal lab results but certainly if there are any significant abnormalities then we will contact you.   Health Maintenance, Female A healthy lifestyle and preventative care can promote health and wellness.  Maintain regular health, dental, and eye exams.  Eat a healthy diet. Foods like vegetables, fruits, whole grains, low-fat dairy products, and lean protein foods contain the nutrients you need without too many calories. Decrease your intake of foods high in solid fats, added sugars, and salt. Get information about a proper diet from your caregiver, if necessary.  Regular physical exercise is one of the most important things you can do for your health. Most adults should get at least 150 minutes of moderate-intensity exercise (any activity that increases your heart rate and causes you to sweat) each week. In addition, most adults need muscle-strengthening exercises on 2 or more days a week.   Maintain a healthy weight. The body mass index (BMI) is a screening tool to identify possible weight problems. It provides an estimate of body fat based on height and weight. Your caregiver can help determine your BMI, and can help you achieve or maintain a healthy weight. For adults 20 years and older:  A BMI below 18.5 is considered underweight.  A BMI of 18.5 to 24.9 is normal.  A BMI of 25 to 29.9 is considered overweight.  A BMI of 30 and above is considered obese.  Maintain normal blood lipids and cholesterol by exercising and minimizing your intake of saturated fat. Eat a balanced diet with plenty of fruits and vegetables. Blood tests for lipids and cholesterol should begin at age 61 and be repeated every 5 years. If your lipid or cholesterol levels are high, you are over 50, or you are a high risk for heart  disease, you may need your cholesterol levels checked more frequently.Ongoing high lipid and cholesterol levels should be treated with medicines if diet and exercise are not effective.  If you smoke, find out from your caregiver how to quit. If you do not use tobacco, do not start.  Lung cancer screening is recommended for adults aged 33 80 years who are at high risk for developing lung cancer because of a history of smoking. Yearly low-dose computed tomography (CT) is recommended for people who have at least a 30-pack-year history of smoking and are a current smoker or have quit within the past 15 years. A pack year of smoking is smoking an average of 1 pack of cigarettes a day for 1 year (for example: 1 pack a day for 30 years or 2 packs a day for 15 years). Yearly screening should continue until the smoker has stopped smoking for at least 15 years. Yearly screening should also be stopped for people who develop a health problem that would prevent them from having lung cancer treatment.  If you are pregnant, do not drink alcohol. If you are breastfeeding, be very cautious about drinking alcohol. If you are not pregnant and choose to drink alcohol, do not exceed 1 drink per day. One drink is considered to be 12 ounces (355 mL) of beer, 5 ounces (148 mL) of wine, or 1.5 ounces (44 mL) of liquor.  Avoid use of street drugs. Do not share needles with anyone. Ask for help if you need support or instructions about stopping  the use of drugs.  High blood pressure causes heart disease and increases the risk of stroke. Blood pressure should be checked at least every 1 to 2 years. Ongoing high blood pressure should be treated with medicines, if weight loss and exercise are not effective.  If you are 59 to 75 years old, ask your caregiver if you should take aspirin to prevent strokes.  Diabetes screening involves taking a blood sample to check your fasting blood sugar level. This should be done once every 3  years, after age 91, if you are within normal weight and without risk factors for diabetes. Testing should be considered at a younger age or be carried out more frequently if you are overweight and have at least 1 risk factor for diabetes.  Breast cancer screening is essential preventative care for women. You should practice "breast self-awareness." This means understanding the normal appearance and feel of your breasts and may include breast self-examination. Any changes detected, no matter how small, should be reported to a caregiver. Women in their 66s and 30s should have a clinical breast exam (CBE) by a caregiver as part of a regular health exam every 1 to 3 years. After age 101, women should have a CBE every year. Starting at age 100, women should consider having a mammogram (breast X-ray) every year. Women who have a family history of breast cancer should talk to their caregiver about genetic screening. Women at a high risk of breast cancer should talk to their caregiver about having an MRI and a mammogram every year.  Breast cancer gene (BRCA)-related cancer risk assessment is recommended for women who have family members with BRCA-related cancers. BRCA-related cancers include breast, ovarian, tubal, and peritoneal cancers. Having family members with these cancers may be associated with an increased risk for harmful changes (mutations) in the breast cancer genes BRCA1 and BRCA2. Results of the assessment will determine the need for genetic counseling and BRCA1 and BRCA2 testing.  The Pap test is a screening test for cervical cancer. Women should have a Pap test starting at age 57. Between ages 25 and 35, Pap tests should be repeated every 2 years. Beginning at age 37, you should have a Pap test every 3 years as long as the past 3 Pap tests have been normal. If you had a hysterectomy for a problem that was not cancer or a condition that could lead to cancer, then you no longer need Pap tests. If you are  between ages 50 and 76, and you have had normal Pap tests going back 10 years, you no longer need Pap tests. If you have had past treatment for cervical cancer or a condition that could lead to cancer, you need Pap tests and screening for cancer for at least 20 years after your treatment. If Pap tests have been discontinued, risk factors (such as a new sexual partner) need to be reassessed to determine if screening should be resumed. Some women have medical problems that increase the chance of getting cervical cancer. In these cases, your caregiver may recommend more frequent screening and Pap tests.  The human papillomavirus (HPV) test is an additional test that may be used for cervical cancer screening. The HPV test looks for the virus that can cause the cell changes on the cervix. The cells collected during the Pap test can be tested for HPV. The HPV test could be used to screen women aged 44 years and older, and should be used in women of any age  who have unclear Pap test results. After the age of 55, women should have HPV testing at the same frequency as a Pap test.  Colorectal cancer can be detected and often prevented. Most routine colorectal cancer screening begins at the age of 44 and continues through age 20. However, your caregiver may recommend screening at an earlier age if you have risk factors for colon cancer. On a yearly basis, your caregiver may provide home test kits to check for hidden blood in the stool. Use of a small camera at the end of a tube, to directly examine the colon (sigmoidoscopy or colonoscopy), can detect the earliest forms of colorectal cancer. Talk to your caregiver about this at age 86, when routine screening begins. Direct examination of the colon should be repeated every 5 to 10 years through age 13, unless early forms of pre-cancerous polyps or small growths are found.  Hepatitis C blood testing is recommended for all people born from [birth year] through 1965 and any  individual with known risks for hepatitis C.  Practice safe sex. Use condoms and avoid high-risk sexual practices to reduce the spread of sexually transmitted infections (STIs). Sexually active women aged 36 and younger should be checked for Chlamydia, which is a common sexually transmitted infection. Older women with new or multiple partners should also be tested for Chlamydia. Testing for other STIs is recommended if you are sexually active and at increased risk.  Osteoporosis is a disease in which the bones lose minerals and strength with aging. This can result in serious bone fractures. The risk of osteoporosis can be identified using a bone density scan. Women ages 20 and over and women at risk for fractures or osteoporosis should discuss screening with their caregivers. Ask your caregiver whether you should be taking a calcium supplement or vitamin D to reduce the rate of osteoporosis.  Menopause can be associated with physical symptoms and risks. Hormone replacement therapy is available to decrease symptoms and risks. You should talk to your caregiver about whether hormone replacement therapy is right for you.  Use sunscreen. Apply sunscreen liberally and repeatedly throughout the day. You should seek shade when your shadow is shorter than you. Protect yourself by wearing long sleeves, pants, a wide-brimmed hat, and sunglasses year round, whenever you are outdoors.  Notify your caregiver of new moles or changes in moles, especially if there is a change in shape or color. Also notify your caregiver if a mole is larger than the size of a pencil eraser.  Stay current with your immunizations. Document Released: 10/22/2010 Document Revised: 08/03/2012 Document Reviewed: 10/22/2010 [facility] Patient Information 2014 [employer].

## 2014-01-13 DIAGNOSIS — Z23 Encounter for immunization: Secondary | ICD-10-CM | POA: Diagnosis not present

## 2014-01-17 ENCOUNTER — Encounter: Payer: Self-pay | Admitting: Gastroenterology

## 2014-02-03 ENCOUNTER — Encounter: Payer: Self-pay | Admitting: *Deleted

## 2014-02-18 DIAGNOSIS — M8580 Other specified disorders of bone density and structure, unspecified site: Secondary | ICD-10-CM | POA: Diagnosis not present

## 2014-02-18 DIAGNOSIS — E785 Hyperlipidemia, unspecified: Secondary | ICD-10-CM | POA: Diagnosis not present

## 2014-02-18 DIAGNOSIS — R8299 Other abnormal findings in urine: Secondary | ICD-10-CM | POA: Diagnosis not present

## 2014-02-18 DIAGNOSIS — I10 Essential (primary) hypertension: Secondary | ICD-10-CM | POA: Diagnosis not present

## 2014-02-18 DIAGNOSIS — M859 Disorder of bone density and structure, unspecified: Secondary | ICD-10-CM | POA: Diagnosis not present

## 2014-02-18 DIAGNOSIS — R8290 Unspecified abnormal findings in urine: Secondary | ICD-10-CM | POA: Diagnosis not present

## 2014-02-21 ENCOUNTER — Encounter: Payer: Self-pay | Admitting: Gynecology

## 2014-02-25 DIAGNOSIS — R05 Cough: Secondary | ICD-10-CM | POA: Diagnosis not present

## 2014-02-25 DIAGNOSIS — B1910 Unspecified viral hepatitis B without hepatic coma: Secondary | ICD-10-CM | POA: Diagnosis not present

## 2014-02-25 DIAGNOSIS — I10 Essential (primary) hypertension: Secondary | ICD-10-CM | POA: Diagnosis not present

## 2014-02-25 DIAGNOSIS — M545 Low back pain: Secondary | ICD-10-CM | POA: Diagnosis not present

## 2014-02-25 DIAGNOSIS — C50919 Malignant neoplasm of unspecified site of unspecified female breast: Secondary | ICD-10-CM | POA: Diagnosis not present

## 2014-02-25 DIAGNOSIS — Z1389 Encounter for screening for other disorder: Secondary | ICD-10-CM | POA: Diagnosis not present

## 2014-02-25 DIAGNOSIS — M179 Osteoarthritis of knee, unspecified: Secondary | ICD-10-CM | POA: Diagnosis not present

## 2014-02-25 DIAGNOSIS — Z0000 Encounter for general adult medical examination without abnormal findings: Secondary | ICD-10-CM | POA: Diagnosis not present

## 2014-02-25 DIAGNOSIS — E785 Hyperlipidemia, unspecified: Secondary | ICD-10-CM | POA: Diagnosis not present

## 2014-02-28 DIAGNOSIS — Z1212 Encounter for screening for malignant neoplasm of rectum: Secondary | ICD-10-CM | POA: Diagnosis not present

## 2014-03-03 ENCOUNTER — Ambulatory Visit (HOSPITAL_BASED_OUTPATIENT_CLINIC_OR_DEPARTMENT_OTHER): Payer: Medicare Other | Admitting: Oncology

## 2014-03-03 ENCOUNTER — Telehealth: Payer: Self-pay | Admitting: Oncology

## 2014-03-03 VITALS — BP 133/64 | HR 70 | Temp 98.2°F | Resp 18 | Ht 61.25 in | Wt 162.6 lb

## 2014-03-03 DIAGNOSIS — Z170 Estrogen receptor positive status [ER+]: Secondary | ICD-10-CM | POA: Diagnosis not present

## 2014-03-03 DIAGNOSIS — C50212 Malignant neoplasm of upper-inner quadrant of left female breast: Secondary | ICD-10-CM

## 2014-03-03 DIAGNOSIS — M8580 Other specified disorders of bone density and structure, unspecified site: Secondary | ICD-10-CM

## 2014-03-03 NOTE — Telephone Encounter (Signed)
per pof to sch pt appt-gavee pt copy of sch

## 2014-03-03 NOTE — Progress Notes (Signed)
Patient ID: Teresa Chapman, female   DOB: [birth year], 73 y.o.   MRN: [id number] ID: Teresa Chapman OB: 08-29-1940  MR#: [id number]  CSN#:[id number]  PCP: Donnajean Lopes, MD GYN:  Donalynn Furlong SU: Rolm Bookbinder OTHER MD: Thea Silversmith, Wilhemina Bonito   HISTORY OF PRESENT ILLNESS: From the original intake note:  Kiyla had bilateral screening mammography at the [facility] 10/06/2012 showing a possible mass in the left breast. Additional views showed that the right breast mass was a simple cyst. However spot compression views on the left showed an area of increased density in the upper inner quadrant. There was no palpable mass on physical exam. Ultrasound however confirmed a hypoechoic irregular mass in the left breast measuring 1.3 cm. Biopsy of this mass 10/28/2012 showed an invasive ductal carcinoma, grade 2, estrogen receptor 90% positive, progesterone receptor 10% positive, with an MIB-1 of 20% and no HER-2 amplification.  On 11/03/2012 the patient underwent bilateral breast MRI this showed only the left breast mass in question, which measured 1.9 cm on this modality. There were no abnormal appearing lymph nodes.  The patient's subsequent history is as detailed below  INTERVAL HISTORY: Teresa Chapman returns today for followup of her breast cancer accompanied by her husband Teresa Chapman. The interval history is generally unremarkable. She continues on anastrozole. Hot flashes are little bit less prominent than before. The arthralgias and myalgias she was experiencing early have disappeared and what she has now is "the same as everybody else". Vaginal dryness is not an issue for her  REVIEW OF SYSTEMS: She exercises 1 hour twice a weekat the gym, and in addition does quite a bit of walking and of course housework and yard work. She has a little bit of a dry cough which is very intermittent and minimal. It is more a "tickle in the throat". She complains of a little bit of stress because her husband Teresa Chapman is now in  charge of financial issues at [employer] and that is a very big responsibility. Aside from this a detailed review of systems today was noncontributory  PAST MEDICAL HISTORY: Past Medical History  Diagnosis Date  . Osteopenia   . Atrophic vaginitis   . Hot flashes   . Hyperlipidemia     takes Atorvastatin daily  . Hypertension     takes Inderal,Maxzide,and Diovan daily  . PONV (postoperative nausea and vomiting)   . Myocardial bridge     intra  . History of bronchitis 04/23/2012  . Arthritis     right wrist and finger joints  . Scoliosis   . Osteoarthritis   . History of colon polyps   . Diverticulosis   . Urinary frequency   . Urinary urgency   . Stress incontinence   . History of blood transfusion 1971    "when my son was born"  (11/18/2012); no abnormal reaction noted  . Cataracts, bilateral     immature  . Insomnia     infreqently takes Lunesta  . History of shingles 67yr ago and in 2013  . Lung cancer 2009  . Breast cancer, left breast 10/2012  . Basal cell cancer     "right face; RLE" (11/18/2012)  . Squamous cell carcinoma     "abdomen" (11/18/2012)  . DVT (deep venous thrombosis)     "? right side; years and years ago" (11/18/2012)  . Exertional shortness of breath   . Hepatitis B 1970's  . Anxiety     "related to breast cancer; totally unexpected" (11/18/2012)  .  Wears glasses   . Dysrhythmia     hx pvc  . Radiation 01/13/13-02/09/13    Left Breast    PAST SURGICAL HISTORY: Past Surgical History  Procedure Laterality Date  . Lung removal, partial Right 2009    upper lobe  . Vaginal hysterectomy  1974  . Cholecystectomy    . Appendectomy    . Dilation and curettage of uterus    . Tonsillectomy and adenoidectomy    . Thymectomy  ~ 1994  . Lung biopsy Right 2009  . Colonoscopy      "I've had a few; last one was 2010" (11/18/2012)  . Cardiac catheterization  03/2010  . Basal cell carcinoma excision      "right face; RLE" (11/18/2012)  . Squamous cell  carcinoma excision      "?abdomen" (11/18/2012)  . Breast biopsy Right ~ 1962    "benign" (11/18/2012)  . Breast biopsy Right 10/2012    "benign cyst" (11/18/2012)  . Breast biopsy Left 10/2012  . Breast lumpectomy Left 11/18/2012    "w/sentinel node bx" (11/18/2012)  . Breast lumpectomy with needle localization and axillary sentinel lymph node bx Left 11/18/2012    Procedure: LEFT BREAST LUMPECTOMY WITH NEEDLE LOCALIZATION AND AXILLARY SENTINEL LYMPH NODE BX;  Surgeon: Rolm Bookbinder, MD;  Location: [facility];  Service: General;  Laterality: Left;  . Re-excision of breast lumpectomy Left 12/10/2012    Procedure: RE-EXCISION OF BREAST LUMPECTOMY;  Surgeon: Rolm Bookbinder, MD;  Location: [facility];  Service: General;  Laterality: Left;    FAMILY HISTORY Family History  Problem Relation Age of Onset  . Hypertension Mother   . Colon cancer Mother   . Breast cancer Mother     Age late 77's  . Heart disease Father    the patient's father died at the age of 63 from a myocardial infarction. The patient's mother died at the age of 80 from Alzheimer's disease. She had been diagnosed with colon cancer at the age of 66 and breast cancer at the age of 72. The patient had one brother who died at birth. No sisters. There is no history of ovarian cancer in the family.  GYNECOLOGIC HISTORY:  Menarche age 35, menopause in her early 28s. She tells me she took birth control pills briefly and developed a clot. She took hormone replacement approximately 10 years, however, without complications.. The patient is GX P2, with first live birth at age 24.  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 Karsten Fells. Her husband Rip Harbour "Teresa Chapman" Malacara is a retired Programmer, applications. Son Latrish Mogel lives in [place] where he works in Engineer, technical sales. Son [name] lives in [place] where he is a Tree surgeon. The patient has 3  grandchildren. She attends the [name]     ADVANCED DIRECTIVES: In place   HEALTH MAINTENANCE: History  Substance Use Topics  . Smoking status: Never Smoker   . Smokeless tobacco: Never Used  . Alcohol Use: 0.0 oz/week     Comment: 11/18/2012 "glass of wine twice/month"     Colonoscopy: Due December 2015  PAP:  Bone density: July 2014  Lipid panel:  Allergies  Allergen Reactions  . Augmentin [Amoxicillin-Pot Clavulanate] Rash  . Codeine Rash    REACTION: hyperactivity, itching    Current Outpatient Prescriptions  Medication Sig Dispense Refill  . acetaminophen (TYLENOL) 500 MG tablet Take 1,000 mg  by mouth every 6 (six) hours as needed for pain.    Marland Kitchen anastrozole (ARIMIDEX) 1 MG tablet Take 1 tablet (1 mg total) by mouth daily. 90 tablet 12  . aspirin 81 MG tablet Take 81 mg by mouth daily.    Marland Kitchen atorvastatin (LIPITOR) 10 MG tablet Take 10 mg by mouth daily.      . Calcium Carbonate-Vit D-Min (CALTRATE PLUS PO) Take 600 mg by mouth 2 (two) times daily.     . Multiple Vitamins-Minerals (CENTRUM PO) Take by mouth daily.      . potassium chloride SA (K-DUR,KLOR-CON) 20 MEQ tablet Take 20 mEq by mouth 2 (two) times daily.    . propranolol (INDERAL) 20 MG tablet Take 20 mg by mouth 2 (two) times daily.     Marland Kitchen triamterene-hydrochlorothiazide (MAXZIDE) 75-50 MG per tablet Take 0.5 tablets by mouth daily.     . valsartan (DIOVAN) 80 MG tablet Take 80 mg by mouth daily.      Marland Kitchen venlafaxine (EFFEXOR) 37.5 MG tablet Take 1 tablet (37.5 mg total) by mouth 2 (two) times daily. 60 tablet 4  . verapamil (CALAN-SR) 180 MG CR tablet Take 180 mg by mouth at bedtime.     No current facility-administered medications for this visit.   Facility-Administered Medications Ordered in Other Visits  Medication Dose Route Frequency Provider Last Rate Last Dose  . topical emolient (BIAFINE) emulsion   Topical PRN Thea Silversmith, MD        OBJECTIVE: Older white woman in no acute  distress Filed Vitals:   03/03/14 1409  BP: 133/64  Pulse: 70  Temp: 98.2 F (36.8 C)  Resp: 18     Body mass index is 30.46 kg/(m^2).    ECOG FS: 1  Sclerae unicteric, EOMs intact Oropharynx clear and moists No cervical or supraclavicular adenopathy Lungs no rales or rhonchi Heart regular rate and rhythm Abd soft, obese, nontender, positive bowel sounds MSK no focal spinal tenderness, no upper extremity lymphedema Neuro: non-focal, well-oriented, positive affect  Breasts: The right breast is unremarkable. The left breast is status post lumpectomy and radiation. There is no evidence of local recurrence. The left axilla is benign. Skin: Multiple seborrheic keratosis but no lesions suspicious for malignancy   LAB RESULTS:  CMP     Component Value Date/Time   NA 142 08/31/2013 1213   NA 139 10/24/2007 0440   K 4.7 08/31/2013 1213   K 3.5 10/24/2007 0440   CL 102 10/24/2007 0440   CO2 27 08/31/2013 1213   CO2 27 10/24/2007 0440   GLUCOSE 108 08/31/2013 1213   GLUCOSE 100* 10/24/2007 0440   BUN 19.1 08/31/2013 1213   BUN 17 10/24/2007 0440   CREATININE 1.0 08/31/2013 1213   CREATININE 1.03 10/24/2007 0440   CALCIUM 10.4 08/31/2013 1213   CALCIUM 8.8 10/24/2007 0440   PROT 7.2 08/31/2013 1213   PROT 5.7* 10/21/2007 0345   ALBUMIN 4.0 08/31/2013 1213   ALBUMIN 3.1* 10/21/2007 0345   AST 22 08/31/2013 1213   AST 29 10/21/2007 0345   ALT 22 08/31/2013 1213   ALT 20 10/21/2007 0345   ALKPHOS 108 08/31/2013 1213   ALKPHOS 65 10/21/2007 0345   BILITOT 0.41 08/31/2013 1213   BILITOT 0.6 10/21/2007 0345   GFRNONAA 53* 10/24/2007 0440   GFRAA  10/24/2007 0440    >60        The eGFR has been calculated using the MDRD equation. This calculation has not been validated in all clinical  I No results found for: SPEP  Lab Results  Component Value Date   WBC 6.0 08/31/2013   NEUTROABS 3.5 08/31/2013   HGB 12.9 08/31/2013   HCT 38.7 08/31/2013   MCV 87.2 08/31/2013    PLT 241 08/31/2013      Chemistry      Component Value Date/Time   NA 142 08/31/2013 1213   NA 139 10/24/2007 0440   K 4.7 08/31/2013 1213   K 3.5 10/24/2007 0440   CL 102 10/24/2007 0440   CO2 27 08/31/2013 1213   CO2 27 10/24/2007 0440   BUN 19.1 08/31/2013 1213   BUN 17 10/24/2007 0440   CREATININE 1.0 08/31/2013 1213   CREATININE 1.03 10/24/2007 0440      Component Value Date/Time   CALCIUM 10.4 08/31/2013 1213   CALCIUM 8.8 10/24/2007 0440   ALKPHOS 108 08/31/2013 1213   ALKPHOS 65 10/21/2007 0345   AST 22 08/31/2013 1213   AST 29 10/21/2007 0345   ALT 22 08/31/2013 1213   ALT 20 10/21/2007 0345   BILITOT 0.41 08/31/2013 1213   BILITOT 0.6 10/21/2007 0345       No results found for: LABCA2  No components found for: LABCA125  No results for input(s): INR in the last 168 hours.  Urinalysis    Component Value Date/Time   COLORURINE YELLOW 10/15/2011 1000   APPEARANCEUR CLEAR 10/15/2011 1000   LABSPEC 1.014 10/15/2011 1000   PHURINE 7.5 10/15/2011 1000   GLUCOSEU NEG 10/15/2011 1000   HGBUR NEG 10/15/2011 1000   BILIRUBINUR NEG 10/15/2011 1000   KETONESUR NEG 10/15/2011 1000   PROTEINUR NEG 10/15/2011 1000   UROBILINOGEN 1 10/15/2011 1000   NITRITE NEG 10/15/2011 1000   LEUKOCYTESUR NEG 10/15/2011 1000    STUDIES: No results found.   ASSESSMENT: 73 y.o. [place] woman status post upper inner quadrant left breast biopsy 10/28/2012 for a clinical T1 N0, stage IA invasive ductal carcinoma, grade 2, estrogen receptor 90% positive, progesterone receptor 10% positive, with no HER-2 amplification, and an MIB-1 of 20%  (1) Oncotype DX score of 21 predicts a distant recurrence risk within 10 years of 13% if the patient's only systemic treatment is tamoxifen for 5 years  (2) status post left lumpectomy and sentinel lymph node sampling in 11/18/2012 for a pT2 pN0, stage IIA invasive ductal carcinoma, grade 1,   (3) status post margin clearance 12/10/2012  with a microscopic focus of invasive ductal carcinoma found adjacent to the prior biopsy cavity, and DCIS within 1 mm of the final margin   (4) completed radiation therapy 02/09/2013  (5)  anastrozole started 02/24/2013  (a) osteopenia, with T score of -1.22 October 2012  PLAN: Cammi is tolerating anastrozole much better and the plan is going to be to continue on that medication for 5 years. We will repeat a bone density July 2016.  I have encouraged her to increase her walking, and weightbearing exercise in general.  She will see Dr. Donne Hazel sometime in February or March. She will see me again late July 2016, after her next mammogram.  Bailea has a good understanding of the overall plan. She agrees with it. She knows the goal of treatment in her case is cure. She will call with any problems that may develop before the next visit here.   Chauncey Cruel, MD   03/03/2014 2:58 PM

## 2014-03-04 NOTE — Addendum Note (Signed)
Addended by: Laureen Abrahams on: 03/04/2014 07:13 PM   Modules accepted: Medications

## 2014-03-14 ENCOUNTER — Ambulatory Visit (AMBULATORY_SURGERY_CENTER): Payer: Self-pay | Admitting: *Deleted

## 2014-03-14 VITALS — Ht 61.0 in | Wt 162.0 lb

## 2014-03-14 DIAGNOSIS — Z800 Family history of malignant neoplasm of digestive organs: Secondary | ICD-10-CM

## 2014-03-14 MED ORDER — MOVIPREP 100 G PO SOLR: ORAL | Status: DC

## 2014-03-14 NOTE — Progress Notes (Signed)
Patient denies any allergies to eggs or soy. Patient denies any problems with anesthesia/sedation. Patient denies any oxygen use at home and does not take any diet/weight loss medications. EMMI education assisgned to patient on colonoscopy, this was explained and instructions given to patient. 

## 2014-03-29 ENCOUNTER — Ambulatory Visit (AMBULATORY_SURGERY_CENTER): Payer: Medicare Other | Admitting: Gastroenterology

## 2014-03-29 ENCOUNTER — Encounter: Payer: Self-pay | Admitting: Gastroenterology

## 2014-03-29 VITALS — BP 136/63 | HR 63 | Temp 98.3°F | Resp 16 | Ht 61.0 in | Wt 162.0 lb

## 2014-03-29 DIAGNOSIS — Z1211 Encounter for screening for malignant neoplasm of colon: Secondary | ICD-10-CM

## 2014-03-29 DIAGNOSIS — I10 Essential (primary) hypertension: Secondary | ICD-10-CM | POA: Diagnosis not present

## 2014-03-29 DIAGNOSIS — Z800 Family history of malignant neoplasm of digestive organs: Secondary | ICD-10-CM | POA: Diagnosis not present

## 2014-03-29 MED ORDER — SODIUM CHLORIDE 0.9 % IV SOLN: 500.0000 mL | INTRAVENOUS | Status: DC

## 2014-03-29 NOTE — Patient Instructions (Signed)
YOU HAD AN ENDOSCOPIC PROCEDURE TODAY AT THE [facility]: Refer to the procedure report that was given to you for any specific questions about what was found during the examination.  If the procedure report does not answer your questions, please call your gastroenterologist to clarify.  If you requested that your care partner not be given the details of your procedure findings, then the procedure report has been included in a sealed envelope for you to review at your convenience later.  YOU SHOULD EXPECT: Some feelings of bloating in the abdomen. Passage of more gas than usual.  Walking can help get rid of the air that was put into your GI tract during the procedure and reduce the bloating. If you had a lower endoscopy (such as a colonoscopy or flexible sigmoidoscopy) you may notice spotting of blood in your stool or on the toilet paper. If you underwent a bowel prep for your procedure, then you may not have a normal bowel movement for a few days.  DIET: Your first meal following the procedure should be a light meal and then it is ok to progress to your normal diet.  A half-sandwich or bowl of soup is an example of a good first meal.  Heavy or fried foods are harder to digest and may make you feel nauseous or bloated.  Likewise meals heavy in dairy and vegetables can cause extra gas to form and this can also increase the bloating.  Drink plenty of fluids but you should avoid alcoholic beverages for 24 hours.  ACTIVITY: Your care partner should take you home directly after the procedure.  You should plan to take it easy, moving slowly for the rest of the day.  You can resume normal activity the day after the procedure however you should NOT DRIVE or use heavy machinery for 24 hours (because of the sedation medicines used during the test).    SYMPTOMS TO REPORT IMMEDIATELY: A gastroenterologist can be reached at any hour.  During normal business hours, 8:30 AM to 5:00 PM Monday through Friday,  call [phone].  After hours and on weekends, please call the GI answering service at [phone] who will take a message and have the physician on call contact you.   Following lower endoscopy (colonoscopy or flexible sigmoidoscopy):  Excessive amounts of blood in the stool  Significant tenderness or worsening of abdominal pains  Swelling of the abdomen that is new, acute  Fever of 100F or higher    FOLLOW UP: If any biopsies were taken you will be contacted by phone or by letter within the next 1-3 weeks.  Call your gastroenterologist if you have not heard about the biopsies in 3 weeks.  Our staff will call the home number listed on your records the next business day following your procedure to check on you and address any questions or concerns that you may have at that time regarding the information given to you following your procedure. This is a courtesy call and so if there is no answer at the home number and we have not heard from you through the emergency physician on call, we will assume that you have returned to your regular daily activities without incident.  SIGNATURES/CONFIDENTIALITY: You and/or your care partner have signed paperwork which will be entered into your electronic medical record.  These signatures attest to the fact that that the information above on your After Visit Summary has been reviewed and is understood.  Full responsibility of the confidentiality   of this discharge information lies with you and/or your care-partner.     

## 2014-03-29 NOTE — Progress Notes (Signed)
Report to PACU, RN, vss, BBS= Clear.  

## 2014-03-30 ENCOUNTER — Telehealth: Payer: Self-pay | Admitting: *Deleted

## 2014-03-30 NOTE — Telephone Encounter (Signed)
  Follow up Call-  Call back number 03/29/2014  Post procedure Call Back phone  # [phone]  Permission to leave phone message Yes     Patient questions:  Do you have a fever, pain , or abdominal swelling? No. Pain Score  0  Have you tolerated food without any problems? Yes.    Have you been able to return to your normal activities? Yes.    Do you have any questions about your discharge instructions: Diet   No. Medications  No. Follow up visit  No.  Do you have questions or concerns about your Care? No.  Actions: * If pain score is 4 or above: No action needed, pain <4.

## 2014-03-30 NOTE — Op Note (Addendum)
[place]  [address] [place], [id number]   COLONOSCOPY PROCEDURE REPORT  PATIENT: Teresa Chapman, Teresa Chapman  MR#: [id number] BIRTHDATE: [birth year] , 73  yrs. old GENDER: female ENDOSCOPIST: Ladene Artist, MD, [facility] REFERRED SE:LTRVUY Philip Aspen, M.D. PROCEDURE DATE:  03/29/2014 PROCEDURE:   Colonoscopy, screening First Screening Colonoscopy - Avg.  risk and is 50 yrs.  old or older - No.  Prior Negative Screening - Now for repeat screening. N/A  History of Adenoma - Now for follow-up colonoscopy & has been > or = to 3 yrs.  N/A  Polyps Removed Today? No.  Polyps Removed Today? No.  Recommend repeat exam, <10 yrs? Polyps Removed Today? No.  Recommend repeat exam, <10 yrs? Yes.  Polyps Removed Today? No.  Recommend repeat exam, <10 yrs? Yes.  High risk (family or personal hx). ASA CLASS:   Class III INDICATIONS:patient's immediate family history of colon cancer. MEDICATIONS: Monitored anesthesia care and Propofol 230 mg IV DESCRIPTION OF PROCEDURE:   After the risks benefits and alternatives of the procedure were thoroughly explained, informed consent was obtained.  The digital rectal exam revealed no abnormalities of the rectum.   The LB [id number]  endoscope was introduced through the anus and advanced to the cecum, which was identified by both the appendix and ileocecal valve. No adverse events experienced.   The quality of the prep was adequate, using MoviPrep  The instrument was then slowly withdrawn as the colon was fully examined.  COLON FINDINGS: There was moderate diverticulosis noted in the sigmoid colon and descending colon.   The examination was otherwise normal.  Retroflexed views revealed internal Grade II hemorrhoids. The time to cecum=3 minutes 34 seconds.  Withdrawal time=12 minutes 37 seconds.  The scope was withdrawn and the procedure completed. COMPLICATIONS: There were no immediate complications.  ENDOSCOPIC IMPRESSION: 1.   Moderate  diverticulosis in the sigmoid colon and descending colon 2.   Grade Il internal hemorrhoids  RECOMMENDATIONS: 1.  High fiber diet with liberal fluid intake. 2.  Repeat Colonoscopy in 5 years with a more extensive bowel prep  eSigned:  Ladene Artist, MD, [facility] 03/29/2014 11:08 AM Revised: 03/29/2014 11:08 AM

## 2014-04-20 DIAGNOSIS — Z6829 Body mass index (BMI) 29.0-29.9, adult: Secondary | ICD-10-CM | POA: Diagnosis not present

## 2014-04-20 DIAGNOSIS — M79671 Pain in right foot: Secondary | ICD-10-CM | POA: Diagnosis not present

## 2014-04-25 ENCOUNTER — Ambulatory Visit (INDEPENDENT_AMBULATORY_CARE_PROVIDER_SITE_OTHER): Payer: Medicare Other | Admitting: Podiatry

## 2014-04-25 ENCOUNTER — Ambulatory Visit (INDEPENDENT_AMBULATORY_CARE_PROVIDER_SITE_OTHER): Payer: Medicare Other

## 2014-04-25 ENCOUNTER — Encounter: Payer: Self-pay | Admitting: Podiatry

## 2014-04-25 VITALS — BP 151/79 | HR 65 | Resp 16

## 2014-04-25 DIAGNOSIS — M7661 Achilles tendinitis, right leg: Secondary | ICD-10-CM

## 2014-04-25 MED ORDER — TRIAMCINOLONE ACETONIDE 10 MG/ML IJ SUSP
10.0000 mg | Freq: Once | INTRAMUSCULAR | Status: AC
Start: 2014-04-25 — End: 2014-04-25
  Administered 2014-04-25: 10 mg

## 2014-04-25 NOTE — Patient Instructions (Signed)

## 2014-04-25 NOTE — Progress Notes (Signed)
   Subjective:    Patient ID: Teresa Chapman, female    DOB: 1940/06/11, 74 y.o.   MRN: [id number]  HPI Comments: "I have this knot"  Patient c/o aching posterior heel right for about 6 weeks. She has a knot. It gets red and swollen. AM pain. She exercises regularly. She remembers twisting the foot initially. She talked to her trainer and said to try Ibuprofen or Naprosyn-some help. Now feels a little pain plantar heel.   Foot Pain Associated symptoms include coughing.      Review of Systems  Eyes: Positive for visual disturbance.  Respiratory: Positive for cough.   Cardiovascular: Positive for palpitations.  Musculoskeletal: Positive for back pain.  All other systems reviewed and are negative.      Objective:   Physical Exam        Assessment & Plan:

## 2014-04-27 NOTE — Progress Notes (Signed)
Subjective:     Patient ID: Teresa Chapman, female   DOB: 1940/12/24, 74 y.o.   MRN: [id number]  HPI patient presents with 6 week history of pain in the right posterior heel and states at this time she has inability to wear shoe gear or anything that rubs the area and it is significantly affecting her lifestyle   Review of Systems  All other systems reviewed and are negative.      Objective:   Physical Exam  Constitutional: She is oriented to person, place, and time.  Cardiovascular: Intact distal pulses.   Musculoskeletal: Normal range of motion.  Neurological: She is oriented to person, place, and time.  Skin: Skin is warm and dry.  Nursing note and vitals reviewed.  neurovascular status found to be intact with muscle strength adequate and range of motion subtalar and midtarsal joint within normal limits. Patient does have equinus and splinting on the right side secondary to posterior pain and patient's found to have good digital perfusion and is well oriented 3. Patient is noted to have significant posterior insertional Achilles tendon pain at the medial side of the insertional point with mild discomfort on the central and lateral side of the tendon complex     Assessment:     Acute Achilles tendinitis medial side right with mild symptoms central and lateral    Plan:     H&P and condition reviewed and at this time I have recommended careful steroidal injection first explaining the risk of rupture to the patient. She wants to procedure and today I sterile prep to the medial side and carefully injected with 3 mg dexamethasone Kenalog 5 mg Xylocaine and did not put it into the central or lateral portion. Advised on reduced activity and being careful with shoe gear selection and reappoint to recheck again in 2 weeks and explained she may require him mobilization

## 2014-05-03 ENCOUNTER — Other Ambulatory Visit: Payer: Self-pay | Admitting: Oncology

## 2014-05-03 DIAGNOSIS — C50212 Malignant neoplasm of upper-inner quadrant of left female breast: Secondary | ICD-10-CM

## 2014-05-05 ENCOUNTER — Encounter: Payer: Self-pay | Admitting: Podiatry

## 2014-05-05 ENCOUNTER — Ambulatory Visit (INDEPENDENT_AMBULATORY_CARE_PROVIDER_SITE_OTHER): Payer: Medicare Other | Admitting: Podiatry

## 2014-05-05 VITALS — BP 141/63 | HR 80 | Resp 16

## 2014-05-05 DIAGNOSIS — M7661 Achilles tendinitis, right leg: Secondary | ICD-10-CM

## 2014-05-05 NOTE — Progress Notes (Signed)
Subjective:     Patient ID: Teresa Chapman, female   DOB: [birth year], 74 y.o.   MRN: [id number]  HPI patient states my heel is feeling a lot better and I'm able to walk with minimal discomfort   Review of Systems     Objective:   Physical Exam Neurovascular status intact no changes in health history with patient noted to having minimal posterior discomfort on the medial side of the heel at the insertion of the Achilles. Muscle strength is excellent    Assessment:     Achilles tendinitis which is doing very well at this time    Plan:     Advised on physical therapy anti-inflammatory shoe gear modifications and heel left. Reappoint if symptoms were to recur or persist

## 2014-05-10 ENCOUNTER — Other Ambulatory Visit: Payer: Self-pay | Admitting: Oncology

## 2014-05-13 ENCOUNTER — Other Ambulatory Visit: Payer: Self-pay | Admitting: Oncology

## 2014-05-13 DIAGNOSIS — C50212 Malignant neoplasm of upper-inner quadrant of left female breast: Secondary | ICD-10-CM

## 2014-07-05 DIAGNOSIS — C50919 Malignant neoplasm of unspecified site of unspecified female breast: Secondary | ICD-10-CM | POA: Diagnosis not present

## 2014-07-29 DIAGNOSIS — Z6830 Body mass index (BMI) 30.0-30.9, adult: Secondary | ICD-10-CM | POA: Diagnosis not present

## 2014-07-29 DIAGNOSIS — R1031 Right lower quadrant pain: Secondary | ICD-10-CM | POA: Diagnosis not present

## 2014-07-29 DIAGNOSIS — K921 Melena: Secondary | ICD-10-CM | POA: Diagnosis not present

## 2014-08-01 ENCOUNTER — Telehealth: Payer: Self-pay | Admitting: *Deleted

## 2014-08-01 NOTE — Telephone Encounter (Signed)
Pt complains of swelling in the right foot and ankle, not sure if related to the achilles tendonitis.  Pt states it has been painful and swollen since Saturday, and would like to wear the elastic anklet.  I told her that would be fine to hold swelling out, but would not give a lot of support or protection to the injured area, I felt she would need an appt.  Pt agreed and I transferred to schedulers.

## 2014-08-02 ENCOUNTER — Ambulatory Visit (INDEPENDENT_AMBULATORY_CARE_PROVIDER_SITE_OTHER): Payer: Medicare Other

## 2014-08-02 ENCOUNTER — Encounter: Payer: Self-pay | Admitting: Podiatry

## 2014-08-02 ENCOUNTER — Ambulatory Visit (INDEPENDENT_AMBULATORY_CARE_PROVIDER_SITE_OTHER): Payer: Medicare Other | Admitting: Podiatry

## 2014-08-02 VITALS — BP 147/72 | HR 67 | Resp 12 | Ht 61.0 in | Wt 162.0 lb

## 2014-08-02 DIAGNOSIS — M79661 Pain in right lower leg: Secondary | ICD-10-CM

## 2014-08-02 DIAGNOSIS — M7661 Achilles tendinitis, right leg: Secondary | ICD-10-CM

## 2014-08-02 DIAGNOSIS — M779 Enthesopathy, unspecified: Secondary | ICD-10-CM | POA: Diagnosis not present

## 2014-08-02 DIAGNOSIS — M722 Plantar fascial fibromatosis: Secondary | ICD-10-CM

## 2014-08-02 MED ORDER — TRIAMCINOLONE ACETONIDE 10 MG/ML IJ SUSP
10.0000 mg | Freq: Once | INTRAMUSCULAR | Status: AC
  Administered 2014-08-02: 10 mg

## 2014-08-02 NOTE — Progress Notes (Signed)
   Subjective:    Patient ID: Teresa Chapman, female    DOB: 1941/02/12, 74 y.o.   MRN: [id number]  HPI Comments: Pt states she has pain and swelling in the right lateral foot and ankle since Saturday 07/30/2014.  Pt states she is also continuing to pain in the right achilles tendon.  Pt states she had a hx of a severe sprain in the right ankle about 4 years ago.  Foot Pain      Review of Systems  All other systems reviewed and are negative.      Objective:   Physical Exam        Assessment & Plan:

## 2014-08-03 NOTE — Progress Notes (Signed)
Subjective:     Patient ID: Teresa Chapman, female   DOB: 11-19-1940, 74 y.o.   MRN: [id number]  HPI patient states that her Achilles tendon is doing reasonably well but she's getting pain in the outside of her right ankle that's been inflamed and swollen for approximately the last week. Does not remember specific injury   Review of Systems     Objective:   Physical Exam Neurovascular status intact muscle strength was adequate with range of motion within normal limits. Patient's noted to have discomfort in the lateral side of the right ankle around the peroneal complex with fluid buildup and is noted to have discomfort in the Achilles still present but moderated from previously    Assessment:     Tendinitis of the peroneal complex right with mild Achilles tendinitis noted right    Plan:     Injected the sheath of the peroneal complex 3 mg Kenalog 5 mg Xylocaine and advised on physical therapy and dispensed fascial brace for immobilization. Discussed Achilles tendon and continued stretching shoe gear modification and reappoint for [place] to recheck again in the next 4 weeks or earlier if any issues should occur

## 2014-09-26 ENCOUNTER — Other Ambulatory Visit: Payer: Self-pay | Admitting: Oncology

## 2014-09-26 DIAGNOSIS — Z853 Personal history of malignant neoplasm of breast: Secondary | ICD-10-CM

## 2014-10-17 ENCOUNTER — Other Ambulatory Visit: Payer: Self-pay

## 2014-11-02 DIAGNOSIS — H35371 Puckering of macula, right eye: Secondary | ICD-10-CM | POA: Diagnosis not present

## 2014-11-02 DIAGNOSIS — H2513 Age-related nuclear cataract, bilateral: Secondary | ICD-10-CM | POA: Diagnosis not present

## 2014-11-02 DIAGNOSIS — H5203 Hypermetropia, bilateral: Secondary | ICD-10-CM | POA: Diagnosis not present

## 2014-11-07 ENCOUNTER — Ambulatory Visit
Admission: RE | Admit: 2014-11-07 | Discharge: 2014-11-07 | Disposition: A | Discharge status: Home / Self Care | Payer: Medicare Other | Source: Ambulatory Visit | Attending: Oncology | Admitting: Oncology

## 2014-11-07 DIAGNOSIS — Z853 Personal history of malignant neoplasm of breast: Secondary | ICD-10-CM

## 2014-11-07 DIAGNOSIS — R928 Other abnormal and inconclusive findings on diagnostic imaging of breast: Secondary | ICD-10-CM | POA: Diagnosis not present

## 2014-11-08 ENCOUNTER — Other Ambulatory Visit (HOSPITAL_BASED_OUTPATIENT_CLINIC_OR_DEPARTMENT_OTHER): Payer: Medicare Other

## 2014-11-08 DIAGNOSIS — C50212 Malignant neoplasm of upper-inner quadrant of left female breast: Secondary | ICD-10-CM | POA: Diagnosis present

## 2014-11-08 DIAGNOSIS — L821 Other seborrheic keratosis: Secondary | ICD-10-CM | POA: Diagnosis not present

## 2014-11-08 DIAGNOSIS — Z85828 Personal history of other malignant neoplasm of skin: Secondary | ICD-10-CM | POA: Diagnosis not present

## 2014-11-08 LAB — COMPREHENSIVE METABOLIC PANEL (CC13)
ALBUMIN: 3.8 g/dL (ref 3.5–5.0)
ALK PHOS: 106 U/L (ref 40–150)
ALT: 21 U/L (ref 0–55)
AST: 21 U/L (ref 5–34)
Anion Gap: 7 mEq/L (ref 3–11)
BILIRUBIN TOTAL: 0.4 mg/dL (ref 0.20–1.20)
BUN: 16.9 mg/dL (ref 7.0–26.0)
CHLORIDE: 106 meq/L (ref 98–109)
CO2: 29 meq/L (ref 22–29)
Calcium: 9.7 mg/dL (ref 8.4–10.4)
Creatinine: 0.9 mg/dL (ref 0.6–1.1)
EGFR: 62 mL/min/{1.73_m2} — AB (ref >90)
GLUCOSE: 96 mg/dL (ref 70–140)
Potassium: 4.1 mEq/L (ref 3.5–5.1)
SODIUM: 143 meq/L (ref 136–145)
Total Protein: 6.7 g/dL (ref 6.4–8.3)

## 2014-11-08 LAB — CBC WITH DIFFERENTIAL/PLATELET
BASO%: 0.3 % (ref 0.0–2.0)
Basophils Absolute: 0 10*3/uL (ref 0.0–0.1)
EOS%: 2.3 % (ref 0.0–7.0)
Eosinophils Absolute: 0.1 10*3/uL (ref 0.0–0.5)
HCT: 36.7 % (ref 34.8–46.6)
HEMOGLOBIN: 11.9 g/dL (ref 11.6–15.9)
LYMPH%: 27.7 % (ref 14.0–49.7)
MCH: 27.3 pg (ref 25.1–34.0)
MCHC: 32.6 g/dL (ref 31.5–36.0)
MCV: 83.9 fL (ref 79.5–101.0)
MONO#: 0.5 10*3/uL (ref 0.1–0.9)
MONO%: 9.4 % (ref 0.0–14.0)
NEUT#: 3.2 10*3/uL (ref 1.5–6.5)
NEUT%: 60.3 % (ref 38.4–76.8)
Platelets: 247 10*3/uL (ref 145–400)
RBC: 4.37 10*6/uL (ref 3.70–5.45)
RDW: 15.2 % — AB (ref 11.2–14.5)
WBC: 5.3 10*3/uL (ref 3.9–10.3)
lymph#: 1.5 10*3/uL (ref 0.9–3.3)

## 2014-11-15 ENCOUNTER — Encounter: Payer: Self-pay | Admitting: Nurse Practitioner

## 2014-11-15 ENCOUNTER — Ambulatory Visit (HOSPITAL_BASED_OUTPATIENT_CLINIC_OR_DEPARTMENT_OTHER): Payer: Medicare Other | Admitting: Nurse Practitioner

## 2014-11-15 ENCOUNTER — Telehealth: Payer: Self-pay | Admitting: Nurse Practitioner

## 2014-11-15 VITALS — BP 134/61 | HR 64 | Temp 98.5°F | Resp 18 | Ht 61.0 in | Wt 167.1 lb

## 2014-11-15 DIAGNOSIS — C50212 Malignant neoplasm of upper-inner quadrant of left female breast: Secondary | ICD-10-CM | POA: Diagnosis not present

## 2014-11-15 DIAGNOSIS — Z79811 Long term (current) use of aromatase inhibitors: Secondary | ICD-10-CM | POA: Diagnosis not present

## 2014-11-15 DIAGNOSIS — Z170 Estrogen receptor positive status [ER+]: Secondary | ICD-10-CM

## 2014-11-15 DIAGNOSIS — M810 Age-related osteoporosis without current pathological fracture: Secondary | ICD-10-CM | POA: Insufficient documentation

## 2014-11-15 DIAGNOSIS — M8580 Other specified disorders of bone density and structure, unspecified site: Secondary | ICD-10-CM | POA: Diagnosis not present

## 2014-11-15 NOTE — Telephone Encounter (Signed)
Appointments made and avs printed for patient °

## 2014-11-15 NOTE — Progress Notes (Signed)
Patient ID: Teresa Chapman, female   DOB: 1940-12-20, 74 y.o.   MRN: [id number] ID: Teresa Chapman OB: 03/22/1941  MR#: [id number]  MLY#:[id number]  PCP: Donnajean Lopes, MD GYN:  Donalynn Furlong SU: Rolm Bookbinder OTHER MD: Thea Silversmith, Wilhemina Bonito  CHIEF COMPLAINT: left breast cancer  CURRENT TREATMENT: anastrozole daily.  BREAST CANCER HISTORY: From the original intake note:  [name] had bilateral screening mammography at the [facility] 10/06/2012 showing a possible mass in the left breast. Additional views showed that the right breast mass was a simple cyst. However spot compression views on the left showed an area of increased density in the upper inner quadrant. There was no palpable mass on physical exam. Ultrasound however confirmed a hypoechoic irregular mass in the left breast measuring 1.3 cm. Biopsy of this mass 10/28/2012 showed an invasive ductal carcinoma, grade 2, estrogen receptor 90% positive, progesterone receptor 10% positive, with an MIB-1 of 20% and no HER-2 amplification.  On 11/03/2012 the patient underwent bilateral breast MRI this showed only the left breast mass in question, which measured 1.9 cm on this modality. There were no abnormal appearing lymph nodes.  The patient's subsequent history is as detailed below  INTERVAL HISTORY: [name] returns today for followup of her breast cancer, accompanied by her husband Ray. She has been on anastrozole since November 2014 and is tolerating this drug well. Her hot flashes have improved since being on venlafaxine. She denies vaginal changes. The arthritis to her right thumb is no worse. The interval history is generally unremarkable.  REVIEW OF SYSTEMS: [name] denies fevers, chills, nausea, vomiting, or changes in bowel or bladder habits. Her stress urinary incontinence is unchanged. She continues to train at the gym twice weekly. She has tendonitis to her right achilles, so she has been taking it easier there. She has occasional  headaches, but denies vision changes or weakness. She is short of breath with exertion and has a dry cough, but denies chest pain. Sometimes she has palpitations. A detailed review of systems is otherwise stable.   PAST MEDICAL HISTORY: Past Medical History  Diagnosis Date  . Osteopenia   . Atrophic vaginitis   . Hot flashes   . Hyperlipidemia     takes Atorvastatin daily  . Hypertension     takes Inderal,Maxzide,and Diovan daily  . PONV (postoperative nausea and vomiting)   . Myocardial bridge     intra  . History of bronchitis 04/23/2012  . Arthritis     right wrist and finger joints  . Scoliosis   . Osteoarthritis   . History of colon polyps   . Diverticulosis   . Urinary frequency   . Urinary urgency   . Stress incontinence   . History of blood transfusion 1971    "when my son was born"  (11/18/2012); no abnormal reaction noted  . Cataracts, bilateral     immature  . Insomnia     infreqently takes Lunesta  . History of shingles 85yr ago and in 2013  . Lung cancer 2009  . Breast cancer, left breast 10/2012  . Basal cell cancer     "right face; RLE" (11/18/2012)  . Squamous cell carcinoma     "abdomen" (11/18/2012)  . DVT (deep venous thrombosis)     "? right side; years and years ago" (11/18/2012)  . Exertional shortness of breath   . Hepatitis B 1970's  . Anxiety     "related to breast cancer; totally unexpected" (11/18/2012)  . Wears glasses   .  Dysrhythmia     hx pvc  . Radiation 01/13/13-02/09/13    Left Breast    PAST SURGICAL HISTORY: Past Surgical History  Procedure Laterality Date  . Lung removal, partial Right 2009    upper lobe  . Vaginal hysterectomy  1974  . Cholecystectomy    . Appendectomy    . Dilation and curettage of uterus    . Tonsillectomy and adenoidectomy    . Thymectomy  ~ 1994  . Lung biopsy Right 2009  . Colonoscopy      "I've had a few; last one was 2010" (11/18/2012)  . Cardiac catheterization  03/2010  . Basal cell carcinoma  excision      "right face; RLE" (11/18/2012)  . Squamous cell carcinoma excision      "?abdomen" (11/18/2012)  . Breast biopsy Right ~ 1962    "benign" (11/18/2012)  . Breast biopsy Right 10/2012    "benign cyst" (11/18/2012)  . Breast biopsy Left 10/2012  . Breast lumpectomy Left 11/18/2012    "w/sentinel node bx" (11/18/2012)  . Breast lumpectomy with needle localization and axillary sentinel lymph node bx Left 11/18/2012    Procedure: LEFT BREAST LUMPECTOMY WITH NEEDLE LOCALIZATION AND AXILLARY SENTINEL LYMPH NODE BX;  Surgeon: Rolm Bookbinder, MD;  Location: [facility];  Service: General;  Laterality: Left;  . Re-excision of breast lumpectomy Left 12/10/2012    Procedure: RE-EXCISION OF BREAST LUMPECTOMY;  Surgeon: Rolm Bookbinder, MD;  Location: [facility];  Service: General;  Laterality: Left;    FAMILY HISTORY Family History  Problem Relation Age of Onset  . Hypertension Mother   . Colon cancer Mother 44  . Breast cancer Mother     Age late 73's  . Heart disease Father    the patient's father died at the age of 81 from a myocardial infarction. The patient's mother died at the age of 72 from Alzheimer's disease. She had been diagnosed with colon cancer at the age of 34 and breast cancer at the age of 68. The patient had one brother who died at birth. No sisters. There is no history of ovarian cancer in the family.  GYNECOLOGIC HISTORY:  Menarche age 78, menopause in her early 87s. She tells me she took birth control pills briefly and developed a clot. She took hormone replacement approximately 10 years, however, without complications.. The patient is GX P2, with first live birth at age 52.  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 Karsten Fells. Her husband Rip Harbour "Ray" Presas is a retired Programmer, applications. Son Audryanna Zurita lives in [place] where he works in Engineer, technical sales. Son [name] lives in [place] where he is a Tree surgeon. The patient has 3 grandchildren. She attends the [name]     ADVANCED DIRECTIVES: In place   HEALTH MAINTENANCE: History  Substance Use Topics  . Smoking status: Never Smoker   . Smokeless tobacco: Never Used  . Alcohol Use: 0.0 oz/week    0 Standard drinks or equivalent per week     Comment: rare     Colonoscopy: Due December 2015  PAP:  Bone density: July 2014  Lipid panel:  Allergies  Allergen Reactions  . Augmentin [Amoxicillin-Pot Clavulanate] Rash  . Codeine Rash    REACTION: hyperactivity, itching    Current Outpatient Prescriptions  Medication Sig Dispense Refill  . acetaminophen (TYLENOL) 500 MG tablet Take 1,000  mg by mouth every 6 (six) hours as needed for pain.    Marland Kitchen anastrozole (ARIMIDEX) 1 MG tablet TAKE 1 TABLET (1 MG TOTAL) BY MOUTH DAILY. 90 tablet 3  . aspirin 81 MG tablet Take 81 mg by mouth daily.    Marland Kitchen atorvastatin (LIPITOR) 10 MG tablet Take 10 mg by mouth daily.      . Calcium Carbonate-Vit D-Min (CALTRATE PLUS PO) Take 600 mg by mouth 2 (two) times daily.     . Eszopiclone 3 MG TABS Take 3 mg by mouth at bedtime as needed.  3  . Multiple Vitamins-Minerals (CENTRUM PO) Take by mouth daily.      . potassium chloride SA (K-DUR,KLOR-CON) 20 MEQ tablet Take 20 mEq by mouth 2 (two) times daily.    . propranolol (INDERAL) 20 MG tablet Take 20 mg by mouth 2 (two) times daily.     Marland Kitchen triamterene-hydrochlorothiazide (MAXZIDE) 75-50 MG per tablet Take 0.5 tablets by mouth daily.     . valsartan (DIOVAN) 80 MG tablet Take 80 mg by mouth daily.      Marland Kitchen venlafaxine (EFFEXOR) 37.5 MG tablet TAKE 1 TABLET (37.5 MG TOTAL) BY MOUTH 2 (TWO) TIMES DAILY. 60 tablet 6  . verapamil (CALAN-SR) 180 MG CR tablet Take 180 mg by mouth daily.     . verapamil (VERELAN PM) 180 MG 24 hr capsule      No current facility-administered medications for this visit.   Facility-Administered Medications Ordered in Other  Visits  Medication Dose Route Frequency Provider Last Rate Last Dose  . topical emolient (BIAFINE) emulsion   Topical PRN Thea Silversmith, MD        OBJECTIVE: Older white woman in no acute distress Filed Vitals:   11/15/14 1348  BP: 134/61  Pulse: 64  Temp: 98.5 F (36.9 C)  Resp: 18     Body mass index is 31.59 kg/(m^2).    ECOG FS: 1  Skin: widespread seborrheic keratoses.  HEENT: sclerae anicteric, conjunctivae pink, oropharynx clear. No thrush or mucositis.  Lymph Nodes: No cervical or supraclavicular lymphadenopathy  Lungs: clear to auscultation bilaterally, no rales, wheezes, or rhonci  Heart: regular rate and rhythm  Abdomen: round, soft, non tender, positive bowel sounds  Musculoskeletal: No focal spinal tenderness, no peripheral edema  Neuro: non focal, well oriented, positive affect  Breasts: left breast status post lumpectomy and radiation. No evidence of recurrent disease. Left axilla benign. Right breast unremarkable.    LAB RESULTS:  CMP     Component Value Date/Time   NA 143 11/08/2014 0943   NA 139 10/24/2007 0440   K 4.1 11/08/2014 0943   K 3.5 10/24/2007 0440   CL 102 10/24/2007 0440   CO2 29 11/08/2014 0943   CO2 27 10/24/2007 0440   GLUCOSE 96 11/08/2014 0943   GLUCOSE 100* 10/24/2007 0440   BUN 16.9 11/08/2014 0943   BUN 17 10/24/2007 0440   CREATININE 0.9 11/08/2014 0943   CREATININE 1.03 10/24/2007 0440   CALCIUM 9.7 11/08/2014 0943   CALCIUM 8.8 10/24/2007 0440   PROT 6.7 11/08/2014 0943   PROT 5.7* 10/21/2007 0345   ALBUMIN 3.8 11/08/2014 0943   ALBUMIN 3.1* 10/21/2007 0345   AST 21 11/08/2014 0943   AST 29 10/21/2007 0345   ALT 21 11/08/2014 0943   ALT 20 10/21/2007 0345   ALKPHOS 106 11/08/2014 0943   ALKPHOS 65 10/21/2007 0345   BILITOT 0.40 11/08/2014 0943   BILITOT 0.6 10/21/2007 0345   GFRNONAA 53*  10/24/2007 0440   GFRAA  10/24/2007 0440    >60        The eGFR has been calculated using the MDRD equation. This calculation  has not been validated in all clinical    I No results found for: SPEP  Lab Results  Component Value Date   WBC 5.3 11/08/2014   NEUTROABS 3.2 11/08/2014   HGB 11.9 11/08/2014   HCT 36.7 11/08/2014   MCV 83.9 11/08/2014   PLT 247 11/08/2014      Chemistry      Component Value Date/Time   NA 143 11/08/2014 0943   NA 139 10/24/2007 0440   K 4.1 11/08/2014 0943   K 3.5 10/24/2007 0440   CL 102 10/24/2007 0440   CO2 29 11/08/2014 0943   CO2 27 10/24/2007 0440   BUN 16.9 11/08/2014 0943   BUN 17 10/24/2007 0440   CREATININE 0.9 11/08/2014 0943   CREATININE 1.03 10/24/2007 0440      Component Value Date/Time   CALCIUM 9.7 11/08/2014 0943   CALCIUM 8.8 10/24/2007 0440   ALKPHOS 106 11/08/2014 0943   ALKPHOS 65 10/21/2007 0345   AST 21 11/08/2014 0943   AST 29 10/21/2007 0345   ALT 21 11/08/2014 0943   ALT 20 10/21/2007 0345   BILITOT 0.40 11/08/2014 0943   BILITOT 0.6 10/21/2007 0345       No results found for: LABCA2  No components found for: [id number]  No results for input(s): INR in the last 168 hours.  Urinalysis    Component Value Date/Time   COLORURINE YELLOW 10/15/2011 1000   APPEARANCEUR CLEAR 10/15/2011 1000   LABSPEC 1.014 10/15/2011 1000   PHURINE 7.5 10/15/2011 1000   GLUCOSEU NEG 10/15/2011 1000   HGBUR NEG 10/15/2011 1000   BILIRUBINUR NEG 10/15/2011 1000   KETONESUR NEG 10/15/2011 1000   PROTEINUR NEG 10/15/2011 1000   UROBILINOGEN 1 10/15/2011 1000   NITRITE NEG 10/15/2011 1000   LEUKOCYTESUR NEG 10/15/2011 1000    STUDIES: Mm Diag Breast Tomo Bilateral  11/07/2014   CLINICAL DATA:  Malignant lumpectomy of the upper inner quadrant of the left breast in July, 2014 pathology invasive ductal carcinoma, DCIS and ALH, negative sentinel lymph nodes. Adjuvant radiation therapy. Currently undergoing hormonal therapy with anastrozole. Annual evaluation.  EXAM: DIGITAL DIAGNOSTIC BILATERAL MAMMOGRAM WITH 3D TOMOSYNTHESIS AND CAD  COMPARISON:   11/01/2013 and earlier.  ACR Breast Density Category c: The breast tissue is heterogeneously dense, which may obscure small masses.  FINDINGS: CC and MLO views of both breasts with tomosynthesis and a spot magnification view of the lumpectomy site in the left breast were obtained. Post lumpectomy scarring in the upper inner left breast, unchanged. Post radiation skin thickening and mild trabecular thickening involving the left breast, slightly improved since the mammogram 1 year ago. No new or suspicious findings in the left breast.  No findings suspicious for malignancy in the right breast.  Mammographic images were processed with CAD.  IMPRESSION: No specific mammographic evidence of malignancy. Expected post lumpectomy and post radiation changes in the left breast.  RECOMMENDATION: Bilateral diagnostic mammography in 1 year.  I have discussed the findings and recommendations with the patient. Results were also provided in writing at the conclusion of the visit. If applicable, a reminder letter will be sent to the patient regarding the next appointment.  BI-RADS CATEGORY  2: Benign.   Electronically Signed   By: Evangeline Dakin M.D.   On: 11/07/2014 16:53  ASSESSMENT: 74 y.o. [place] woman status post upper inner quadrant left breast biopsy 10/28/2012 for a clinical T1 N0, stage IA invasive ductal carcinoma, grade 2, estrogen receptor 90% positive, progesterone receptor 10% positive, with no HER-2 amplification, and an MIB-1 of 20%  (1) Oncotype DX score of 21 predicts a distant recurrence risk within 10 years of 13% if the patient's only systemic treatment is tamoxifen for 5 years  (2) status post left lumpectomy and sentinel lymph node sampling in 11/18/2012 for a pT2 pN0, stage IIA invasive ductal carcinoma, grade 1,   (3) status post margin clearance 12/10/2012 with a microscopic focus of invasive ductal carcinoma found adjacent to the prior biopsy cavity, and DCIS within 1 mm of the final  margin   (4) completed radiation therapy 02/09/2013  (5)  anastrozole started 02/24/2013  (a) osteopenia, with T score of -1.22 October 2012  PLAN: Hara is doing well as far as her breast cancer is concerned. She is now 2 years out from her definitive surgery with no evidence of recurrent disease. The labs were reviewed in detail and were entirely stable. She is tolerating the anastrozole well, and will continue this drug for 5 years of antiestrogen therapy.   She is due for a repeat bone density scan this month, so I have placed the orders for this to be [facility] at [facility].   Taje will return in 1 year for labs and a follow up visit. She understands and agrees with this plan. She knows the goal of treatment in her case is cure. She has been encouraged to call with any issues that might arise before her next visit here.    Laurie Panda, NP   11/15/2014 3:34 PM

## 2014-11-18 ENCOUNTER — Other Ambulatory Visit: Payer: Self-pay | Admitting: Nurse Practitioner

## 2014-11-18 DIAGNOSIS — Z79811 Long term (current) use of aromatase inhibitors: Secondary | ICD-10-CM

## 2014-11-18 DIAGNOSIS — M8580 Other specified disorders of bone density and structure, unspecified site: Secondary | ICD-10-CM

## 2014-11-22 ENCOUNTER — Other Ambulatory Visit: Payer: Medicare Other

## 2014-11-22 ENCOUNTER — Ambulatory Visit
Admission: RE | Admit: 2014-11-22 | Discharge: 2014-11-22 | Disposition: A | Discharge status: Home / Self Care | Payer: Medicare Other | Source: Ambulatory Visit | Attending: Nurse Practitioner | Admitting: Nurse Practitioner

## 2014-11-22 DIAGNOSIS — M8580 Other specified disorders of bone density and structure, unspecified site: Secondary | ICD-10-CM

## 2014-11-22 DIAGNOSIS — M85852 Other specified disorders of bone density and structure, left thigh: Secondary | ICD-10-CM | POA: Diagnosis not present

## 2014-11-22 DIAGNOSIS — Z79811 Long term (current) use of aromatase inhibitors: Secondary | ICD-10-CM

## 2014-12-06 ENCOUNTER — Other Ambulatory Visit: Payer: Self-pay | Admitting: Oncology

## 2014-12-08 DIAGNOSIS — H2513 Age-related nuclear cataract, bilateral: Secondary | ICD-10-CM | POA: Diagnosis not present

## 2014-12-08 DIAGNOSIS — H04123 Dry eye syndrome of bilateral lacrimal glands: Secondary | ICD-10-CM | POA: Diagnosis not present

## 2014-12-08 DIAGNOSIS — S0501XA Injury of conjunctiva and corneal abrasion without foreign body, right eye, initial encounter: Secondary | ICD-10-CM | POA: Diagnosis not present

## 2015-01-07 DIAGNOSIS — Z23 Encounter for immunization: Secondary | ICD-10-CM | POA: Diagnosis not present

## 2015-01-12 ENCOUNTER — Encounter: Payer: Self-pay | Admitting: Podiatry

## 2015-01-13 DIAGNOSIS — M7661 Achilles tendinitis, right leg: Secondary | ICD-10-CM | POA: Diagnosis not present

## 2015-01-13 DIAGNOSIS — M6701 Short Achilles tendon (acquired), right ankle: Secondary | ICD-10-CM | POA: Diagnosis not present

## 2015-01-30 DIAGNOSIS — M6528 Calcific tendinitis, other site: Secondary | ICD-10-CM | POA: Diagnosis not present

## 2015-02-01 DIAGNOSIS — M6528 Calcific tendinitis, other site: Secondary | ICD-10-CM | POA: Diagnosis not present

## 2015-02-03 DIAGNOSIS — M6528 Calcific tendinitis, other site: Secondary | ICD-10-CM | POA: Diagnosis not present

## 2015-02-06 DIAGNOSIS — M6528 Calcific tendinitis, other site: Secondary | ICD-10-CM | POA: Diagnosis not present

## 2015-02-08 DIAGNOSIS — M6528 Calcific tendinitis, other site: Secondary | ICD-10-CM | POA: Diagnosis not present

## 2015-02-15 DIAGNOSIS — M6528 Calcific tendinitis, other site: Secondary | ICD-10-CM | POA: Diagnosis not present

## 2015-02-17 DIAGNOSIS — M6528 Calcific tendinitis, other site: Secondary | ICD-10-CM | POA: Diagnosis not present

## 2015-02-22 DIAGNOSIS — M859 Disorder of bone density and structure, unspecified: Secondary | ICD-10-CM | POA: Diagnosis not present

## 2015-02-22 DIAGNOSIS — I10 Essential (primary) hypertension: Secondary | ICD-10-CM | POA: Diagnosis not present

## 2015-02-22 DIAGNOSIS — E785 Hyperlipidemia, unspecified: Secondary | ICD-10-CM | POA: Diagnosis not present

## 2015-03-01 DIAGNOSIS — I10 Essential (primary) hypertension: Secondary | ICD-10-CM | POA: Diagnosis not present

## 2015-03-01 DIAGNOSIS — E785 Hyperlipidemia, unspecified: Secondary | ICD-10-CM | POA: Diagnosis not present

## 2015-03-01 DIAGNOSIS — M859 Disorder of bone density and structure, unspecified: Secondary | ICD-10-CM | POA: Diagnosis not present

## 2015-03-01 DIAGNOSIS — Z0000 Encounter for general adult medical examination without abnormal findings: Secondary | ICD-10-CM | POA: Diagnosis not present

## 2015-03-01 DIAGNOSIS — C50919 Malignant neoplasm of unspecified site of unspecified female breast: Secondary | ICD-10-CM | POA: Diagnosis not present

## 2015-03-01 DIAGNOSIS — Z6831 Body mass index (BMI) 31.0-31.9, adult: Secondary | ICD-10-CM | POA: Diagnosis not present

## 2015-03-01 DIAGNOSIS — M179 Osteoarthritis of knee, unspecified: Secondary | ICD-10-CM | POA: Diagnosis not present

## 2015-03-01 DIAGNOSIS — M545 Low back pain: Secondary | ICD-10-CM | POA: Diagnosis not present

## 2015-03-01 DIAGNOSIS — Z1389 Encounter for screening for other disorder: Secondary | ICD-10-CM | POA: Diagnosis not present

## 2015-03-01 DIAGNOSIS — Z23 Encounter for immunization: Secondary | ICD-10-CM | POA: Diagnosis not present

## 2015-03-10 DIAGNOSIS — M6701 Short Achilles tendon (acquired), right ankle: Secondary | ICD-10-CM | POA: Diagnosis not present

## 2015-03-10 DIAGNOSIS — M7661 Achilles tendinitis, right leg: Secondary | ICD-10-CM | POA: Diagnosis not present

## 2015-05-14 ENCOUNTER — Other Ambulatory Visit: Payer: Self-pay | Admitting: Oncology

## 2015-05-15 ENCOUNTER — Other Ambulatory Visit: Payer: Self-pay | Admitting: *Deleted

## 2015-05-15 DIAGNOSIS — C50212 Malignant neoplasm of upper-inner quadrant of left female breast: Secondary | ICD-10-CM

## 2015-05-15 MED ORDER — ANASTROZOLE 1 MG PO TABS: ORAL_TABLET | ORAL | Status: DC

## 2015-06-03 DIAGNOSIS — J1089 Influenza due to other identified influenza virus with other manifestations: Secondary | ICD-10-CM | POA: Diagnosis not present

## 2015-06-03 DIAGNOSIS — R062 Wheezing: Secondary | ICD-10-CM | POA: Diagnosis not present

## 2015-06-24 ENCOUNTER — Other Ambulatory Visit: Payer: Self-pay | Admitting: Oncology

## 2015-06-30 DIAGNOSIS — M25532 Pain in left wrist: Secondary | ICD-10-CM | POA: Diagnosis not present

## 2015-06-30 DIAGNOSIS — Z6830 Body mass index (BMI) 30.0-30.9, adult: Secondary | ICD-10-CM | POA: Diagnosis not present

## 2015-07-09 IMAGING — MG MM DIAGNOSTIC BILATERAL
6 series · 6 of 6 positions shown · non-contrast
Comparison: Previous exams.

CLINICAL DATA: Status post bilateral ultrasound guided core
biopsies.

DIGITAL DIAGNOSTIC BILATERAL MAMMOGRAM

[R CC]
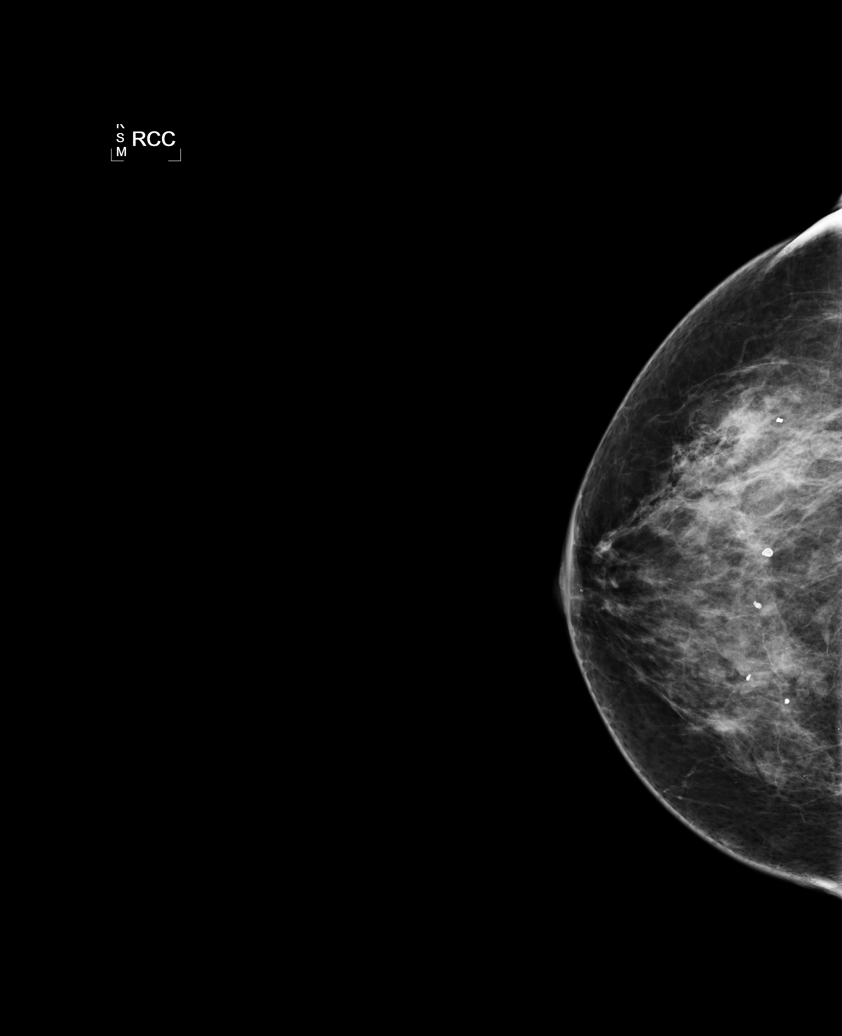

[L CC]
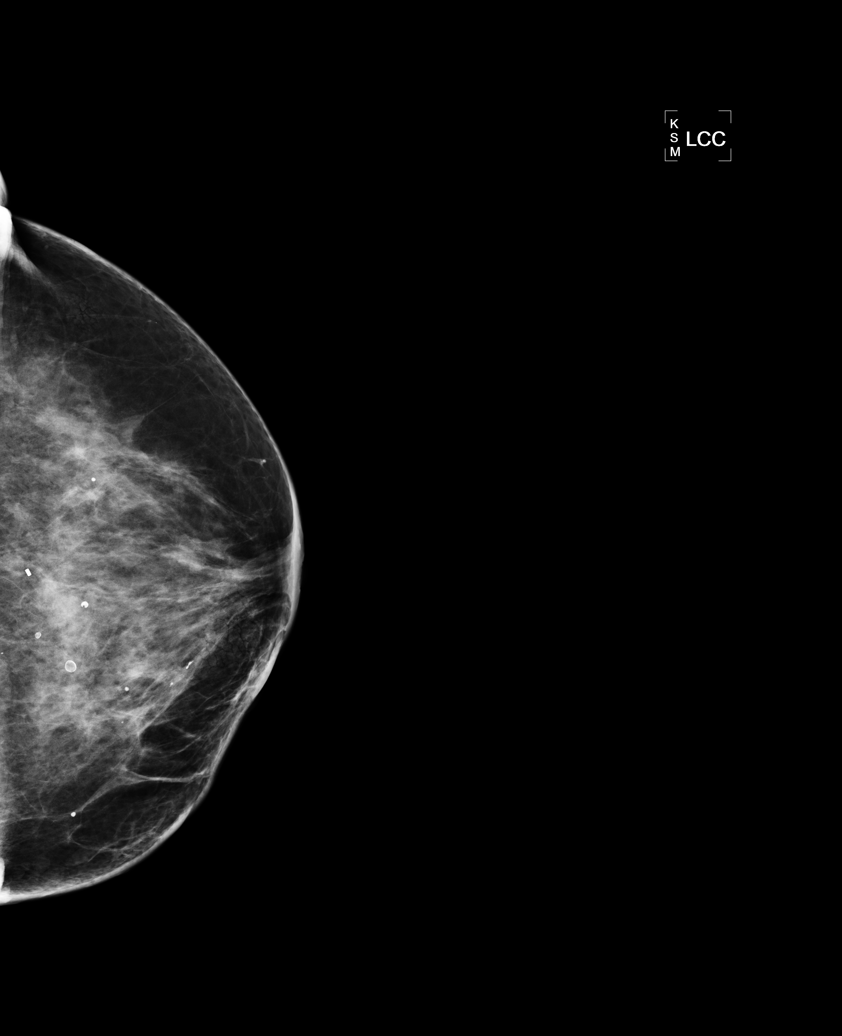

[L MLO]
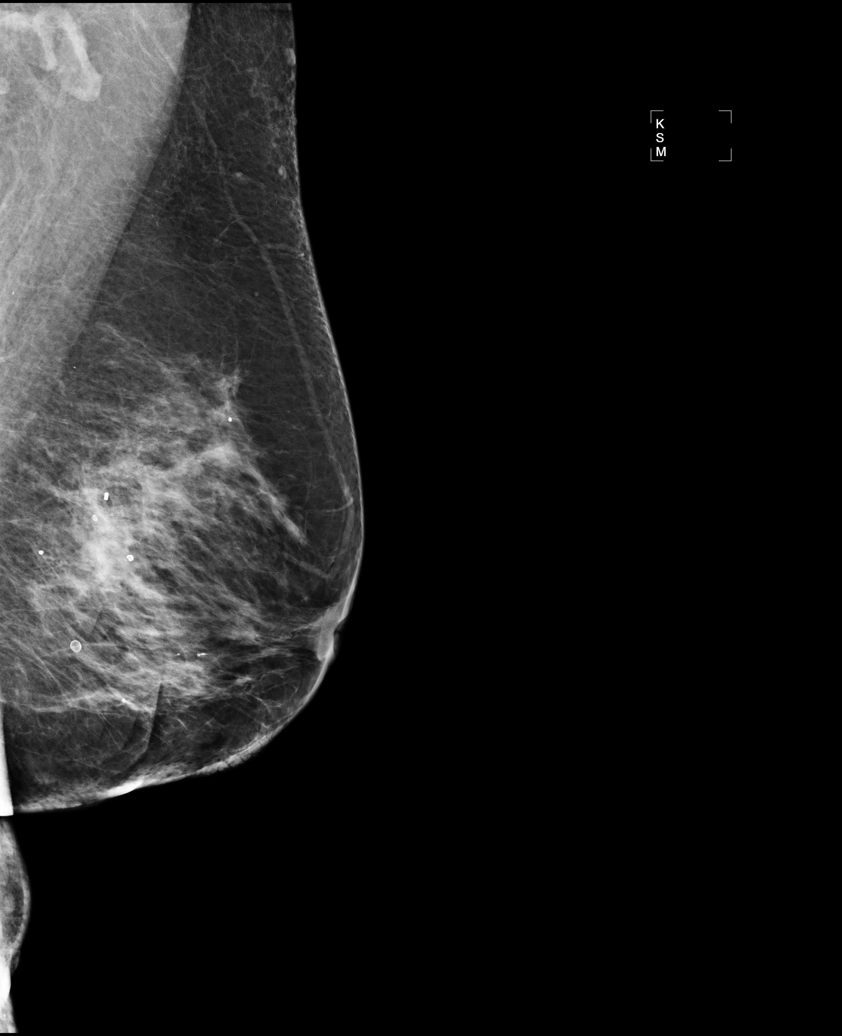

[L ML (1 of 2)]
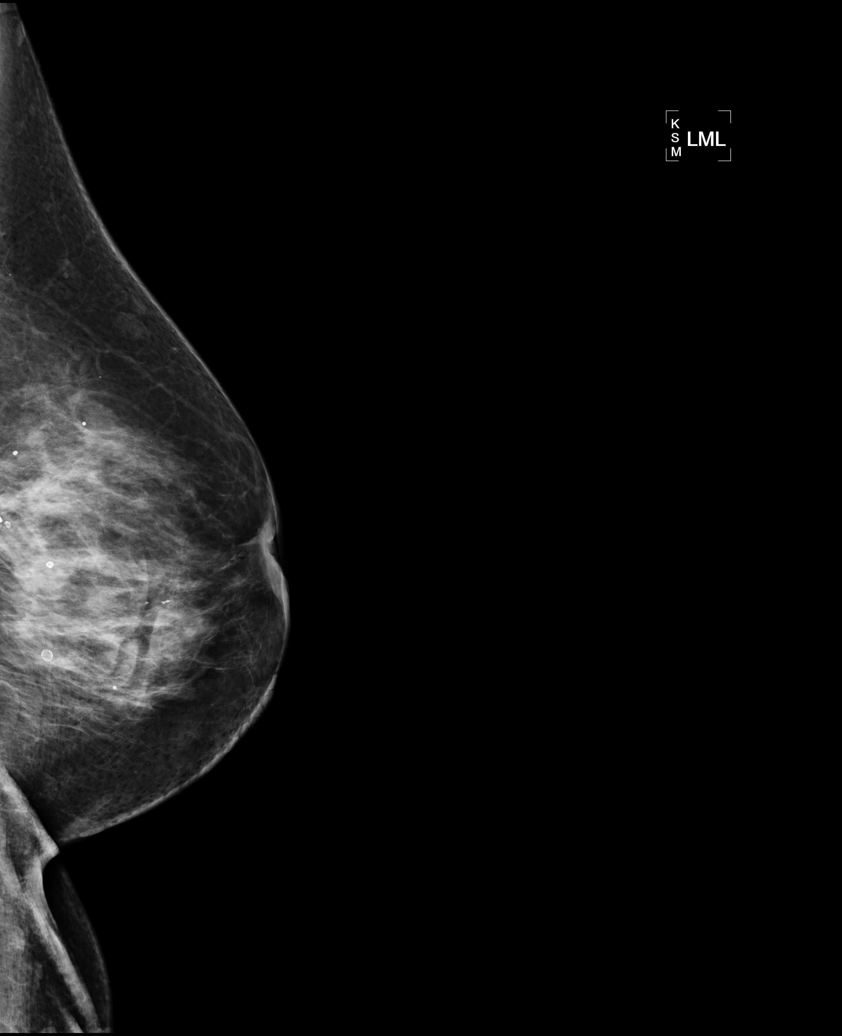

[R ML]
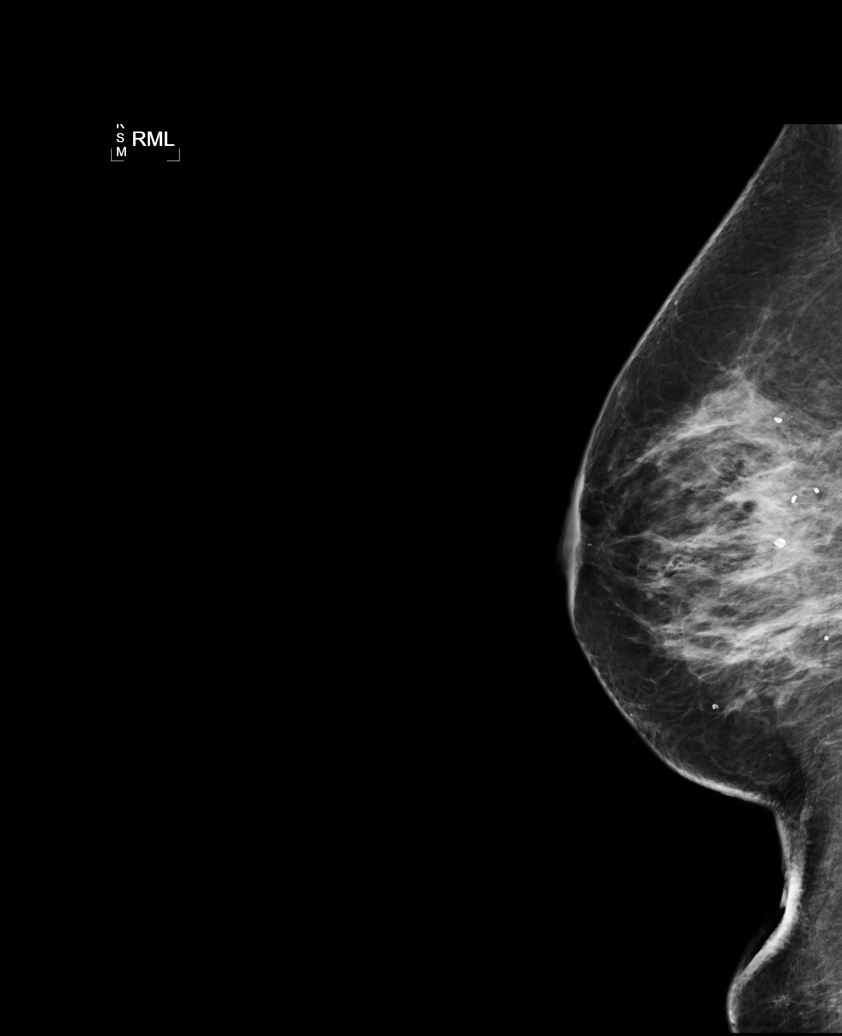

[L ML (2 of 2)]
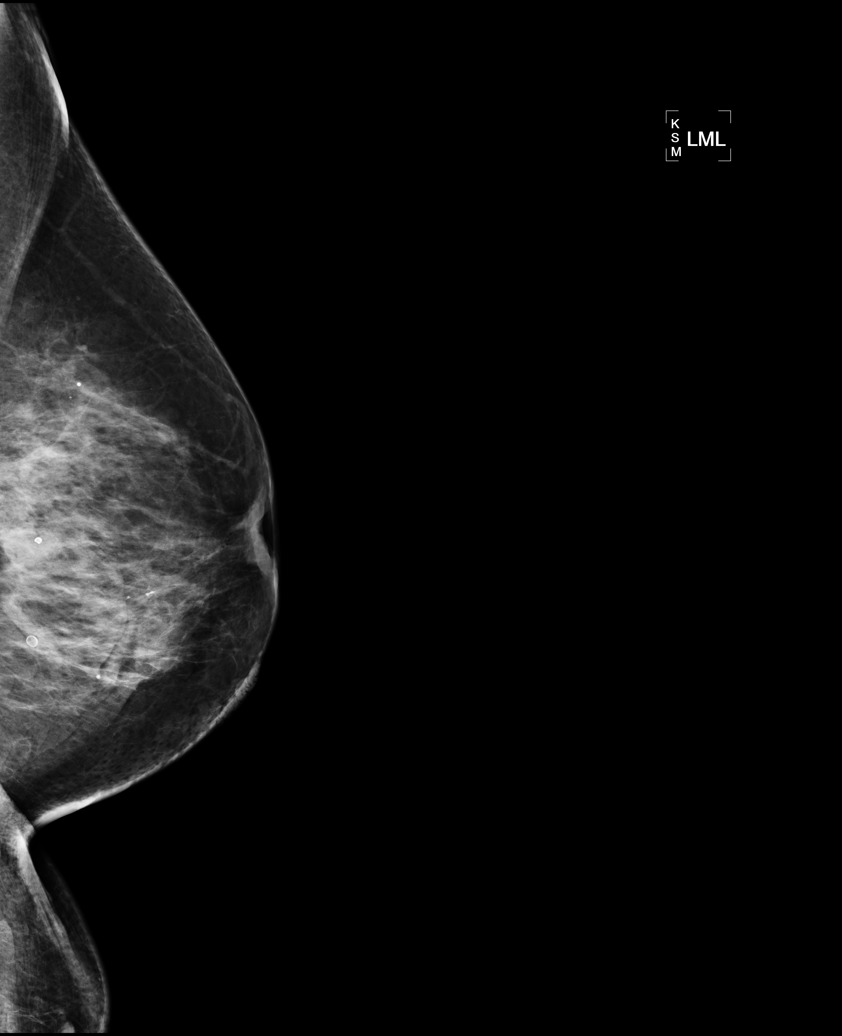

[6 of 6 positions shown; findings below may reference images not displayed]

FINDINGS: Films are performed following ultrasound guided biopsy
of mass in the 9:30 o'clock location of the right breast and 11
o'clock location of the left breast.  A top hat shaped clip is
identified in the lateral portion of the right breast as expected.
Slightly medial in location of the left breast, a cylindrical clip
is identified following biopsy.
IMPRESSION: Bilateral tissue marker clips are in expected locations after
biopsy.

## 2015-09-29 ENCOUNTER — Telehealth: Payer: Self-pay | Admitting: Oncology

## 2015-09-29 NOTE — Telephone Encounter (Signed)
pt cld to r/s appt-gave pt r/s time & date for 8/8'@3'$ 

## 2015-10-25 ENCOUNTER — Other Ambulatory Visit: Payer: Self-pay | Admitting: Oncology

## 2015-10-25 DIAGNOSIS — N644 Mastodynia: Secondary | ICD-10-CM

## 2015-10-25 DIAGNOSIS — Z853 Personal history of malignant neoplasm of breast: Secondary | ICD-10-CM

## 2015-11-08 DIAGNOSIS — H52203 Unspecified astigmatism, bilateral: Secondary | ICD-10-CM | POA: Diagnosis not present

## 2015-11-08 DIAGNOSIS — H35371 Puckering of macula, right eye: Secondary | ICD-10-CM | POA: Diagnosis not present

## 2015-11-08 DIAGNOSIS — H2513 Age-related nuclear cataract, bilateral: Secondary | ICD-10-CM | POA: Diagnosis not present

## 2015-11-08 DIAGNOSIS — H5203 Hypermetropia, bilateral: Secondary | ICD-10-CM | POA: Diagnosis not present

## 2015-11-08 DIAGNOSIS — H16223 Keratoconjunctivitis sicca, not specified as Sjogren's, bilateral: Secondary | ICD-10-CM | POA: Diagnosis not present

## 2015-11-10 ENCOUNTER — Ambulatory Visit
Admission: RE | Admit: 2015-11-10 | Discharge: 2015-11-10 | Disposition: A | Discharge status: Home / Self Care | Payer: Medicare Other | Source: Ambulatory Visit | Attending: Oncology | Admitting: Oncology

## 2015-11-10 DIAGNOSIS — N644 Mastodynia: Secondary | ICD-10-CM

## 2015-11-10 DIAGNOSIS — R922 Inconclusive mammogram: Secondary | ICD-10-CM | POA: Diagnosis not present

## 2015-11-10 DIAGNOSIS — Z853 Personal history of malignant neoplasm of breast: Secondary | ICD-10-CM

## 2015-11-16 ENCOUNTER — Other Ambulatory Visit: Payer: Medicare Other

## 2015-11-16 ENCOUNTER — Ambulatory Visit: Payer: Medicare Other | Admitting: Oncology

## 2015-11-28 ENCOUNTER — Telehealth: Payer: Self-pay | Admitting: Oncology

## 2015-11-28 ENCOUNTER — Other Ambulatory Visit (HOSPITAL_BASED_OUTPATIENT_CLINIC_OR_DEPARTMENT_OTHER): Payer: Medicare Other

## 2015-11-28 ENCOUNTER — Other Ambulatory Visit: Payer: Self-pay

## 2015-11-28 ENCOUNTER — Ambulatory Visit (HOSPITAL_BASED_OUTPATIENT_CLINIC_OR_DEPARTMENT_OTHER): Payer: Medicare Other | Admitting: Oncology

## 2015-11-28 VITALS — BP 144/64 | HR 68 | Temp 98.8°F | Resp 18 | Ht 61.0 in | Wt 165.3 lb

## 2015-11-28 DIAGNOSIS — Z86718 Personal history of other venous thrombosis and embolism: Secondary | ICD-10-CM | POA: Diagnosis not present

## 2015-11-28 DIAGNOSIS — Z79811 Long term (current) use of aromatase inhibitors: Secondary | ICD-10-CM | POA: Diagnosis not present

## 2015-11-28 DIAGNOSIS — Z170 Estrogen receptor positive status [ER+]: Secondary | ICD-10-CM

## 2015-11-28 DIAGNOSIS — C50212 Malignant neoplasm of upper-inner quadrant of left female breast: Secondary | ICD-10-CM

## 2015-11-28 DIAGNOSIS — M8580 Other specified disorders of bone density and structure, unspecified site: Secondary | ICD-10-CM

## 2015-11-28 LAB — CBC WITH DIFFERENTIAL/PLATELET
BASO%: 0.2 % (ref 0.0–2.0)
Basophils Absolute: 0 10*3/uL (ref 0.0–0.1)
EOS%: 2 % (ref 0.0–7.0)
Eosinophils Absolute: 0.1 10*3/uL (ref 0.0–0.5)
HEMATOCRIT: 37.2 % (ref 34.8–46.6)
HEMOGLOBIN: 12 g/dL (ref 11.6–15.9)
LYMPH#: 1.9 10*3/uL (ref 0.9–3.3)
LYMPH%: 26.6 % (ref 14.0–49.7)
MCH: 26.3 pg (ref 25.1–34.0)
MCHC: 32.2 g/dL (ref 31.5–36.0)
MCV: 81.8 fL (ref 79.5–101.0)
MONO#: 0.7 10*3/uL (ref 0.1–0.9)
MONO%: 9.1 % (ref 0.0–14.0)
NEUT#: 4.5 10*3/uL (ref 1.5–6.5)
NEUT%: 62.1 % (ref 38.4–76.8)
Platelets: 277 10*3/uL (ref 145–400)
RBC: 4.55 10*6/uL (ref 3.70–5.45)
RDW: 15.8 % — AB (ref 11.2–14.5)
WBC: 7.2 10*3/uL (ref 3.9–10.3)

## 2015-11-28 LAB — COMPREHENSIVE METABOLIC PANEL
ALBUMIN: 3.9 g/dL (ref 3.5–5.0)
ALK PHOS: 114 U/L (ref 40–150)
ALT: 21 U/L (ref 0–55)
AST: 21 U/L (ref 5–34)
Anion Gap: 9 mEq/L (ref 3–11)
BUN: 18.3 mg/dL (ref 7.0–26.0)
CALCIUM: 10.2 mg/dL (ref 8.4–10.4)
CO2: 29 mEq/L (ref 22–29)
CREATININE: 1 mg/dL (ref 0.6–1.1)
Chloride: 104 mEq/L (ref 98–109)
EGFR: 55 mL/min/{1.73_m2} — ABNORMAL LOW (ref >90)
Glucose: 105 mg/dl (ref 70–140)
Potassium: 4.3 mEq/L (ref 3.5–5.1)
Sodium: 142 mEq/L (ref 136–145)
Total Bilirubin: 0.3 mg/dL (ref 0.20–1.20)
Total Protein: 7.3 g/dL (ref 6.4–8.3)

## 2015-11-28 MED ORDER — ANASTROZOLE 1 MG PO TABS: ORAL_TABLET | ORAL | 3 refills | Status: DC

## 2015-11-28 MED ORDER — VENLAFAXINE HCL 37.5 MG PO TABS: 37.5000 mg | ORAL_TABLET | Freq: Two times a day (BID) | ORAL | 6 refills | Status: DC

## 2015-11-28 NOTE — Telephone Encounter (Signed)
appt made and calendar mailed to pt 8/8

## 2015-11-28 NOTE — Progress Notes (Signed)
Patient ID: Teresa Chapman, female   DOB: 01/06/1941, 75 y.o.   MRN: [id number] ID: Teresa Chapman OB: 1940-09-11  MR#: [id number]  IEP#:[id number]  PCP: Teresa Lopes, MD GYN:  Teresa Chapman SU: Teresa Chapman OTHER MD: Teresa Chapman, Teresa Chapman  CHIEF COMPLAINT: left breast cancer  CURRENT TREATMENT: anastrozole   BREAST CANCER HISTORY: From the original intake note:  Teresa Chapman had bilateral screening mammography at the [facility] 10/06/2012 showing a possible mass in the left breast. Additional views showed that the right breast mass was a simple cyst. However spot compression views on the left showed an area of increased density in the upper inner quadrant. There was no palpable mass on physical exam. Ultrasound however confirmed a hypoechoic irregular mass in the left breast measuring 1.3 cm. Biopsy of this mass 10/28/2012 showed an invasive ductal carcinoma, grade 2, estrogen receptor 90% positive, progesterone receptor 10% positive, with an MIB-1 of 20% and no HER-2 amplification.  On 11/03/2012 the patient underwent bilateral breast MRI this showed only the left breast mass in question, which measured 1.9 cm on this modality. There were no abnormal appearing lymph nodes.  The patient's subsequent history is as detailed below  INTERVAL HISTORY: Teresa Chapman returns today for followup of her estrogen receptor positive breast cancer, accompanied by her husband Teresa Chapman. She continues on anastrozole, with good tolerance. She has occasional hot flashes. They're not a major concern. Vaginal dryness is not an issue. She obtains a drug at a good price.  REVIEW OF SYSTEMS: Teresa Chapman she gets short of breath climbing stairs but she gets to the top without stopping. She has some stress urinary incontinence problems. She exercises with a trainer twice a week. Aside from this a detailed review of systems today was entirely stable  PAST MEDICAL HISTORY: Past Medical History:  Diagnosis Date  . Anxiety     "related to breast cancer; totally unexpected" (11/18/2012)  . Arthritis    right wrist and finger joints  . Atrophic vaginitis   . Basal cell cancer    "right face; RLE" (11/18/2012)  . Breast cancer, left breast 10/2012  . Cataracts, bilateral    immature  . Diverticulosis   . DVT (deep venous thrombosis)    "? right side; years and years ago" (11/18/2012)  . Dysrhythmia    hx pvc  . Exertional shortness of breath   . Hepatitis B 1970's  . History of blood transfusion 1971   "when my son was born"  (11/18/2012); no abnormal reaction noted  . History of bronchitis 04/23/2012  . History of colon polyps   . History of shingles 33yr ago and in 2013  . Hot flashes   . Hyperlipidemia    takes Atorvastatin daily  . Hypertension    takes Inderal,Maxzide,and Diovan daily  . Insomnia    infreqently takes Lunesta  . Lung cancer 2009  . Myocardial bridge    intra  . Osteoarthritis   . Osteopenia   . PONV (postoperative nausea and vomiting)   . Radiation 01/13/13-02/09/13   Left Breast  . Scoliosis   . Squamous cell carcinoma    "abdomen" (11/18/2012)  . Stress incontinence   . Urinary frequency   . Urinary urgency   . Wears glasses     PAST SURGICAL HISTORY: Past Surgical History:  Procedure Laterality Date  . APPENDECTOMY    . BASAL CELL CARCINOMA EXCISION     "right face; RLE" (11/18/2012)  . BREAST BIOPSY Right ~ 1962   "  benign" (11/18/2012)  . BREAST BIOPSY Right 10/2012   "benign cyst" (11/18/2012)  . BREAST BIOPSY Left 10/2012  . BREAST LUMPECTOMY Left 11/18/2012   "w/sentinel node bx" (11/18/2012)  . BREAST LUMPECTOMY WITH NEEDLE LOCALIZATION AND AXILLARY SENTINEL LYMPH NODE BX Left 11/18/2012   Procedure: LEFT BREAST LUMPECTOMY WITH NEEDLE LOCALIZATION AND AXILLARY SENTINEL LYMPH NODE BX;  Surgeon: Teresa Bookbinder, MD;  Location: [facility];  Service: General;  Laterality: Left;  . CARDIAC CATHETERIZATION  03/2010  . CHOLECYSTECTOMY    . COLONOSCOPY     "I've had a few; last  one was 2010" (11/18/2012)  . DILATION AND CURETTAGE OF UTERUS    . LUNG BIOPSY Right 2009  . LUNG REMOVAL, PARTIAL Right 2009   upper lobe  . RE-EXCISION OF BREAST LUMPECTOMY Left 12/10/2012   Procedure: RE-EXCISION OF BREAST LUMPECTOMY;  Surgeon: Teresa Bookbinder, MD;  Location: [facility];  Service: General;  Laterality: Left;  . SQUAMOUS CELL CARCINOMA EXCISION     "?abdomen" (11/18/2012)  . THYMECTOMY  ~ 1994  . TONSILLECTOMY AND ADENOIDECTOMY    . VAGINAL HYSTERECTOMY  1974    FAMILY HISTORY Family History  Problem Relation Age of Onset  . Hypertension Mother   . Colon cancer Mother 38  . Breast cancer Mother     Age late 62's  . Heart disease Father    the patient's father died at the age of 58 from a myocardial infarction. The patient's mother died at the age of 78 from Alzheimer's disease. She had been diagnosed with colon cancer at the age of 61 and breast cancer at the age of 40. The patient had one brother who died at birth. No sisters. There is no history of ovarian cancer in the family.  GYNECOLOGIC HISTORY:  Menarche age 52, menopause in her early 38s. She tells me she took birth control pills briefly and developed a clot. She took hormone replacement approximately 10 years, however, without complications.. The patient is GX P2, with first live birth at age 58.  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 Karsten Chapman. Her husband Teresa Chapman "Teresa Chapman" Teresa Chapman is a retired Programmer, applications. Son Teresa Chapman lives in [place] where he works in Engineer, technical sales. Son Teresa Chapman lives in [place] where he is a Tree surgeon. The patient has 3 grandchildren. She attends the [employer]   HEALTH MAINTENANCE: Social History  Substance Use Topics  . Smoking status: Never Smoker  . Smokeless tobacco: Never Used  . Alcohol use 0.0 oz/week      Comment: rare     Colonoscopy: Due December 2015  PAP:  Bone density: 11/22/2014  Lipid panel:  Allergies  Allergen Reactions  . Augmentin [Amoxicillin-Pot Clavulanate] Rash  . Codeine Rash    REACTION: hyperactivity, itching    Current Outpatient Prescriptions  Medication Sig Dispense Refill  . acetaminophen (TYLENOL) 500 MG tablet Take 1,000 mg by mouth every 6 (six) hours as needed for pain.    Marland Kitchen anastrozole (ARIMIDEX) 1 MG tablet TAKE 1 TABLET (1 MG TOTAL) BY MOUTH DAILY. 90 tablet 3  . aspirin 81 MG tablet Take 81 mg by mouth daily.    Marland Kitchen atorvastatin (LIPITOR) 10 MG tablet Take 10 mg by mouth daily.      . Calcium Carbonate-Vit D-Min (CALTRATE PLUS PO) Take 600 mg by mouth 2 (  two) times daily.     . Multiple Vitamins-Minerals (CENTRUM PO) Take by mouth daily.      . potassium chloride SA (K-DUR,KLOR-CON) 20 MEQ tablet Take 20 mEq by mouth 2 (two) times daily.    . propranolol (INDERAL) 20 MG tablet Take 20 mg by mouth 2 (two) times daily.     Marland Kitchen triamterene-hydrochlorothiazide (MAXZIDE) 75-50 MG per tablet Take 0.5 tablets by mouth daily.     . valsartan (DIOVAN) 80 MG tablet Take 80 mg by mouth daily.      Marland Kitchen venlafaxine (EFFEXOR) 37.5 MG tablet TAKE 1 TABLET BY MOUTH TWICE A DAY 60 tablet 6  . verapamil (CALAN-SR) 180 MG CR tablet Take 180 mg by mouth daily.     . verapamil (VERELAN PM) 180 MG 24 hr capsule      No current facility-administered medications for this visit.    Facility-Administered Medications Ordered in Other Visits  Medication Dose Route Frequency Provider Last Rate Last Dose  . topical emolient (BIAFINE) emulsion   Topical PRN Teresa Silversmith, MD        OBJECTIVE: Older white womanWho appears stated age Vitals:   11/28/15 1452  BP: (!) 144/64  Pulse: 68  Resp: 18  Temp: 98.8 F (37.1 C)     Body mass index is 31.23 kg/m.    ECOG FS: 1  Sclerae unicteric, pupils round and equal Oropharynx clear and moist-- no thrush or other lesions No cervical  or supraclavicular adenopathy Lungs no rales or rhonchi Heart regular rate and rhythm Abd soft, nontender, positive bowel sounds MSK mild kyphosis but no focal spinal tenderness, no upper extremity lymphedema Neuro: nonfocal, well oriented, appropriate affect Breasts: The right breast is unremarkable. The left breast is status post lumpectomy and radiation. There is no evidence of local recurrence. The left axilla is benign    LAB RESULTS:  CMP     Component Value Date/Time   NA 143 11/08/2014 0943   K 4.1 11/08/2014 0943   CL 102 10/24/2007 0440   CO2 29 11/08/2014 0943   GLUCOSE 96 11/08/2014 0943   BUN 16.9 11/08/2014 0943   CREATININE 0.9 11/08/2014 0943   CALCIUM 9.7 11/08/2014 0943   PROT 6.7 11/08/2014 0943   ALBUMIN 3.8 11/08/2014 0943   AST 21 11/08/2014 0943   ALT 21 11/08/2014 0943   ALKPHOS 106 11/08/2014 0943   BILITOT 0.40 11/08/2014 0943   GFRNONAA 53 (L) 10/24/2007 0440   GFRAA  10/24/2007 0440    >60        The eGFR has been calculated using the MDRD equation. This calculation has not been validated in all clinical    I No results found for: SPEP  Lab Results  Component Value Date   WBC 7.2 11/28/2015   NEUTROABS 4.5 11/28/2015   HGB 12.0 11/28/2015   HCT 37.2 11/28/2015   MCV 81.8 11/28/2015   PLT 277 11/28/2015      Chemistry      Component Value Date/Time   NA 143 11/08/2014 0943   K 4.1 11/08/2014 0943   CL 102 10/24/2007 0440   CO2 29 11/08/2014 0943   BUN 16.9 11/08/2014 0943   CREATININE 0.9 11/08/2014 0943      Component Value Date/Time   CALCIUM 9.7 11/08/2014 0943   ALKPHOS 106 11/08/2014 0943   AST 21 11/08/2014 0943   ALT 21 11/08/2014 0943   BILITOT 0.40 11/08/2014 0943       No results found for:  LABCA2  No components found for: LABCA125  No results for input(s): INR in the last 168 hours.  Urinalysis    Component Value Date/Time   COLORURINE YELLOW 10/15/2011 1000   APPEARANCEUR CLEAR 10/15/2011 1000    LABSPEC 1.014 10/15/2011 1000   PHURINE 7.5 10/15/2011 1000   GLUCOSEU NEG 10/15/2011 1000   HGBUR NEG 10/15/2011 1000   BILIRUBINUR NEG 10/15/2011 1000   KETONESUR NEG 10/15/2011 1000   PROTEINUR NEG 10/15/2011 1000   UROBILINOGEN 1 10/15/2011 1000   NITRITE NEG 10/15/2011 1000   LEUKOCYTESUR NEG 10/15/2011 1000    STUDIES: Mm Diag Breast Tomo Bilateral  Result Date: 11/10/2015 CLINICAL DATA:  History of left lumpectomy with radiation therapy in 2014. EXAM: 2D DIGITAL DIAGNOSTIC BILATERAL MAMMOGRAM WITH CAD AND ADJUNCT TOMO COMPARISON:  11/07/2014 and earlier ACR Breast Density Category c: The breast tissue is heterogeneously dense, which may obscure small masses. FINDINGS: Post operative changes are seen in the leftbreast. No suspicious mass, distortion, or microcalcifications are identified to suggest presence of malignancy. Mammographic images were processed with CAD. IMPRESSION: No mammographic evidence for malignancy. RECOMMENDATION: Diagnostic mammogram is suggested in 1 year. (Code:DM-B-01Y) I have discussed the findings and recommendations with the patient. Results were also provided in writing at the conclusion of the visit. If applicable, a reminder letter will be sent to the patient regarding the next appointment. BI-RADS CATEGORY  2: Benign. Electronically Signed   By: Nolon Nations M.D.   On: 11/10/2015 10:42   CLINICAL DATA:  Postmenopausal. Follow up osteopenia.  EXAM: DUAL X-Teresa Chapman ABSORPTIOMETRY (DXA) FOR BONE MINERAL DENSITY  FINDINGS: AP LUMBAR SPINE L1, L2, L4  Bone Mineral Density (BMD):  0.977 g/cm2  Young Adult T-Score:  -0.5  Z-Score:  1.9  LEFT FEMUR NECK  Bone Mineral Density (BMD):  0.711 g/cm2  Young Adult T-Score: -1.2  Z-Score:  0.9  ASSESSMENT: Patient's diagnostic category is LOW BONE MASS by WHO Criteria.  FRACTURE RISK:  Increased  FRAX: 10 year fracture risk for major osteoporotic fracture is 17%. Ten year fracture risk for  hip fracture is 7.8%.  COMPARISON: There has been statistically significant worsening compared to both the baseline exam and the most recent prior exam.   ASSESSMENT: 75 y.o. [place] woman status post upper inner quadrant left breast biopsy 10/28/2012 for a clinical T1 N0, stage IA invasive ductal carcinoma, grade 2, estrogen receptor 90% positive, progesterone receptor 10% positive, with no HER-2 amplification, and an MIB-1 of 20%  (1) Oncotype DX score of 21 predicts a distant recurrence risk within 10 years of 13% if the patient's only systemic treatment is tamoxifen for 5 years  (2) status post left lumpectomy and sentinel lymph node sampling in 11/18/2012 for a pT2 pN0, stage IIA invasive ductal carcinoma, grade 1,   (3) status post margin clearance 12/10/2012 with a microscopic focus of invasive ductal carcinoma found adjacent to the prior biopsy cavity, and DCIS within 1 mm of the final margin   (4) completed radiation therapy 02/09/2013  (5)  anastrozole started 02/24/2013  (a) osteopenia, with T score of -1.22 October 2012  (b) bone density 11/22/2014 visit T score of -1.2   PLAN: Jaicee is now 3 years out from definitive surgery for her breast cancer with no evidence of disease recurrence. This is very favorable.  She is tolerating the anastrozole well. Given her history of DVT and heart problems she is not a candidate for tamoxifen. The plan is to continue anastrozole for a total of  5 years.  I do not know why her most recent bone density is being read as "worsening". The T score from 10/30/2012 was -1.3 and -0.9, now it is -1.2 and -0.5. I think this wasread.  At any rate I am comfortable with her continuing her current exercise and vitamin D supplementation. I do not think we need to add Prolia at this point  She will see me again in one year. She knows to call for any problems that may develop before that visit.   Chauncey Cruel, MD   11/28/2015 3:19 PM

## 2015-12-06 DIAGNOSIS — Z85828 Personal history of other malignant neoplasm of skin: Secondary | ICD-10-CM | POA: Diagnosis not present

## 2015-12-06 DIAGNOSIS — L821 Other seborrheic keratosis: Secondary | ICD-10-CM | POA: Diagnosis not present

## 2015-12-06 DIAGNOSIS — C4449 Other specified malignant neoplasm of skin of scalp and neck: Secondary | ICD-10-CM | POA: Diagnosis not present

## 2015-12-06 DIAGNOSIS — D485 Neoplasm of uncertain behavior of skin: Secondary | ICD-10-CM | POA: Diagnosis not present

## 2015-12-06 DIAGNOSIS — L570 Actinic keratosis: Secondary | ICD-10-CM | POA: Diagnosis not present

## 2015-12-13 DIAGNOSIS — H16223 Keratoconjunctivitis sicca, not specified as Sjogren's, bilateral: Secondary | ICD-10-CM | POA: Diagnosis not present

## 2016-01-06 DIAGNOSIS — Z23 Encounter for immunization: Secondary | ICD-10-CM | POA: Diagnosis not present

## 2016-02-26 DIAGNOSIS — N390 Urinary tract infection, site not specified: Secondary | ICD-10-CM | POA: Diagnosis not present

## 2016-02-26 DIAGNOSIS — R8299 Other abnormal findings in urine: Secondary | ICD-10-CM | POA: Diagnosis not present

## 2016-02-26 DIAGNOSIS — I10 Essential (primary) hypertension: Secondary | ICD-10-CM | POA: Diagnosis not present

## 2016-02-26 DIAGNOSIS — M859 Disorder of bone density and structure, unspecified: Secondary | ICD-10-CM | POA: Diagnosis not present

## 2016-02-26 DIAGNOSIS — E784 Other hyperlipidemia: Secondary | ICD-10-CM | POA: Diagnosis not present

## 2016-03-04 DIAGNOSIS — C50919 Malignant neoplasm of unspecified site of unspecified female breast: Secondary | ICD-10-CM | POA: Diagnosis not present

## 2016-03-04 DIAGNOSIS — Z6831 Body mass index (BMI) 31.0-31.9, adult: Secondary | ICD-10-CM | POA: Diagnosis not present

## 2016-03-04 DIAGNOSIS — E784 Other hyperlipidemia: Secondary | ICD-10-CM | POA: Diagnosis not present

## 2016-03-04 DIAGNOSIS — Z0000 Encounter for general adult medical examination without abnormal findings: Secondary | ICD-10-CM | POA: Diagnosis not present

## 2016-03-04 DIAGNOSIS — I10 Essential (primary) hypertension: Secondary | ICD-10-CM | POA: Diagnosis not present

## 2016-03-04 DIAGNOSIS — M179 Osteoarthritis of knee, unspecified: Secondary | ICD-10-CM | POA: Diagnosis not present

## 2016-03-04 DIAGNOSIS — M25562 Pain in left knee: Secondary | ICD-10-CM | POA: Diagnosis not present

## 2016-03-04 DIAGNOSIS — M859 Disorder of bone density and structure, unspecified: Secondary | ICD-10-CM | POA: Diagnosis not present

## 2016-03-04 DIAGNOSIS — M25561 Pain in right knee: Secondary | ICD-10-CM | POA: Diagnosis not present

## 2016-03-04 DIAGNOSIS — Z1389 Encounter for screening for other disorder: Secondary | ICD-10-CM | POA: Diagnosis not present

## 2016-03-20 DIAGNOSIS — H16223 Keratoconjunctivitis sicca, not specified as Sjogren's, bilateral: Secondary | ICD-10-CM | POA: Diagnosis not present

## 2016-05-07 DIAGNOSIS — M25562 Pain in left knee: Secondary | ICD-10-CM | POA: Diagnosis not present

## 2016-05-07 DIAGNOSIS — Z6831 Body mass index (BMI) 31.0-31.9, adult: Secondary | ICD-10-CM | POA: Diagnosis not present

## 2016-05-22 DIAGNOSIS — M170 Bilateral primary osteoarthritis of knee: Secondary | ICD-10-CM | POA: Diagnosis not present

## 2016-05-22 DIAGNOSIS — M25562 Pain in left knee: Secondary | ICD-10-CM | POA: Diagnosis not present

## 2016-05-22 DIAGNOSIS — G8929 Other chronic pain: Secondary | ICD-10-CM | POA: Diagnosis not present

## 2016-05-22 DIAGNOSIS — M25561 Pain in right knee: Secondary | ICD-10-CM | POA: Diagnosis not present

## 2016-07-04 DIAGNOSIS — M170 Bilateral primary osteoarthritis of knee: Secondary | ICD-10-CM | POA: Diagnosis not present

## 2016-09-02 DIAGNOSIS — M179 Osteoarthritis of knee, unspecified: Secondary | ICD-10-CM | POA: Diagnosis not present

## 2016-09-02 DIAGNOSIS — Z6831 Body mass index (BMI) 31.0-31.9, adult: Secondary | ICD-10-CM | POA: Diagnosis not present

## 2016-09-02 DIAGNOSIS — I10 Essential (primary) hypertension: Secondary | ICD-10-CM | POA: Diagnosis not present

## 2016-09-02 DIAGNOSIS — Z1389 Encounter for screening for other disorder: Secondary | ICD-10-CM | POA: Diagnosis not present

## 2016-09-02 DIAGNOSIS — L723 Sebaceous cyst: Secondary | ICD-10-CM | POA: Diagnosis not present

## 2016-09-07 ENCOUNTER — Other Ambulatory Visit: Payer: Self-pay | Admitting: Oncology

## 2016-10-08 ENCOUNTER — Other Ambulatory Visit: Payer: Self-pay | Admitting: Oncology

## 2016-10-08 DIAGNOSIS — Z853 Personal history of malignant neoplasm of breast: Secondary | ICD-10-CM

## 2016-11-11 ENCOUNTER — Ambulatory Visit
Admission: RE | Admit: 2016-11-11 | Discharge: 2016-11-11 | Disposition: A | Discharge status: Home / Self Care | Payer: Medicare Other | Source: Ambulatory Visit | Attending: Oncology | Admitting: Oncology

## 2016-11-11 DIAGNOSIS — Z853 Personal history of malignant neoplasm of breast: Secondary | ICD-10-CM

## 2016-11-11 DIAGNOSIS — R922 Inconclusive mammogram: Secondary | ICD-10-CM | POA: Diagnosis not present

## 2016-11-11 HISTORY — DX: Personal history of irradiation: Z92.3

## 2016-11-20 ENCOUNTER — Other Ambulatory Visit (HOSPITAL_BASED_OUTPATIENT_CLINIC_OR_DEPARTMENT_OTHER): Payer: Medicare Other

## 2016-11-20 DIAGNOSIS — C50212 Malignant neoplasm of upper-inner quadrant of left female breast: Secondary | ICD-10-CM

## 2016-11-20 LAB — COMPREHENSIVE METABOLIC PANEL
ALBUMIN: 4.1 g/dL (ref 3.5–5.0)
ALK PHOS: 109 U/L (ref 40–150)
ALT: 19 U/L (ref 0–55)
AST: 23 U/L (ref 5–34)
Anion Gap: 12 mEq/L — ABNORMAL HIGH (ref 3–11)
BILIRUBIN TOTAL: 0.41 mg/dL (ref 0.20–1.20)
BUN: 18.1 mg/dL (ref 7.0–26.0)
CO2: 30 meq/L — AB (ref 22–29)
CREATININE: 1 mg/dL (ref 0.6–1.1)
Calcium: 10.5 mg/dL — ABNORMAL HIGH (ref 8.4–10.4)
Chloride: 105 mEq/L (ref 98–109)
EGFR: 54 mL/min/{1.73_m2} — ABNORMAL LOW (ref >90)
GLUCOSE: 103 mg/dL (ref 70–140)
Potassium: 4.5 mEq/L (ref 3.5–5.1)
Sodium: 146 mEq/L — ABNORMAL HIGH (ref 136–145)
Total Protein: 7.5 g/dL (ref 6.4–8.3)

## 2016-11-20 LAB — CBC WITH DIFFERENTIAL/PLATELET
BASO%: 0.7 % (ref 0.0–2.0)
Basophils Absolute: 0 10*3/uL (ref 0.0–0.1)
EOS ABS: 0.1 10*3/uL (ref 0.0–0.5)
EOS%: 2 % (ref 0.0–7.0)
HCT: 40.9 % (ref 34.8–46.6)
HEMOGLOBIN: 13.5 g/dL (ref 11.6–15.9)
LYMPH%: 35.5 % (ref 14.0–49.7)
MCH: 29.8 pg (ref 25.1–34.0)
MCHC: 33 g/dL (ref 31.5–36.0)
MCV: 90.2 fL (ref 79.5–101.0)
MONO#: 0.7 10*3/uL (ref 0.1–0.9)
MONO%: 11.1 % (ref 0.0–14.0)
NEUT#: 3 10*3/uL (ref 1.5–6.5)
NEUT%: 50.7 % (ref 38.4–76.8)
Platelets: 241 10*3/uL (ref 145–400)
RBC: 4.54 10*6/uL (ref 3.70–5.45)
RDW: 14.9 % — AB (ref 11.2–14.5)
WBC: 5.9 10*3/uL (ref 3.9–10.3)
lymph#: 2.1 10*3/uL (ref 0.9–3.3)

## 2016-11-27 ENCOUNTER — Ambulatory Visit (HOSPITAL_BASED_OUTPATIENT_CLINIC_OR_DEPARTMENT_OTHER): Payer: Medicare Other | Admitting: Oncology

## 2016-11-27 VITALS — BP 149/67 | HR 60 | Temp 98.0°F | Resp 18 | Ht 61.0 in | Wt 168.0 lb

## 2016-11-27 DIAGNOSIS — Z170 Estrogen receptor positive status [ER+]: Secondary | ICD-10-CM | POA: Diagnosis not present

## 2016-11-27 DIAGNOSIS — C50212 Malignant neoplasm of upper-inner quadrant of left female breast: Secondary | ICD-10-CM

## 2016-11-27 DIAGNOSIS — M8580 Other specified disorders of bone density and structure, unspecified site: Secondary | ICD-10-CM

## 2016-11-27 MED ORDER — ANASTROZOLE 1 MG PO TABS: ORAL_TABLET | ORAL | 3 refills | Status: DC

## 2016-11-27 NOTE — Progress Notes (Signed)
Patient ID: Teresa Chapman, female   DOB: 25-Jul-1940, 76 y.o.   MRN: [id number] ID: Nalini B Mclelland OB: 1940/10/06  MR#: [id number]  KNL#:[id number]  PCP: Leanna Battles, MD GYN:  Donalynn Furlong SU: Rolm Bookbinder OTHER MD: Thea Silversmith, Wilhemina Bonito  CHIEF COMPLAINT: left breast cancer  CURRENT TREATMENT: anastrozole   BREAST CANCER HISTORY: From the original intake note:  Zennie had bilateral screening mammography at the [facility] Center 10/06/2012 showing a possible mass in the left breast. Additional views showed that the right breast mass was a simple cyst. However spot compression views on the left showed an area of increased density in the upper inner quadrant. There was no palpable mass on physical exam. Ultrasound however confirmed a hypoechoic irregular mass in the left breast measuring 1.3 cm. Biopsy of this mass 10/28/2012 showed an invasive ductal carcinoma, grade 2, estrogen receptor 90% positive, progesterone receptor 10% positive, with an MIB-1 of 20% and no HER-2 amplification.  On 11/03/2012 the patient underwent bilateral breast MRI this showed only the left breast mass in question, which measured 1.9 cm on this modality. There were no abnormal appearing lymph nodes.  The patient's subsequent history is as detailed below  INTERVAL HISTORY: Pasty returns today for follow-up and treatment of her estrogen receptor positive breast cancer, accompanied by her husband Ray. You and continues on anastrozole, with good tolerance.Hot flashes and vaginal dryness are not a major issue. She never developed the arthralgias or myalgias that many patients can experience on this medication. She obtains it at a good price.  REVIEW OF SYSTEMS: Deyna is concerned about her weight. 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 He exercises with a trainer twice a week and then does her housework and some yard work. Both she and the patient are very  concerned about their son 43 who is currently living with them. A detailed review of systems today was otherwise stable  PAST MEDICAL HISTORY: Past Medical History:  Diagnosis Date  . Anxiety    "related to breast cancer; totally unexpected" (11/18/2012)  . Arthritis    right wrist and finger joints  . Atrophic vaginitis   . Basal cell cancer    "right face; RLE" (11/18/2012)  . Breast cancer, left breast ([facility]) 10/2012  . Cataracts, bilateral    immature  . Diverticulosis   . DVT (deep venous thrombosis) ([facility])    "? right side; years and years ago" (11/18/2012)  . Dysrhythmia    hx pvc  . Exertional shortness of breath   . Hepatitis B 1970's  . History of blood transfusion 1971   "when my son was born"  (11/18/2012); no abnormal reaction noted  . History of bronchitis 04/23/2012  . History of colon polyps   . History of shingles 18yr ago and in 2013  . Hot flashes   . Hyperlipidemia    takes Atorvastatin daily  . Hypertension    takes Inderal,Maxzide,and Diovan daily  . Insomnia    infreqently takes Lunesta  . Lung cancer ([facility] 2009  . Myocardial bridge    intra  . Osteoarthritis   . Osteopenia   . Personal history of radiation therapy   . PONV (postoperative nausea and vomiting)   . Radiation 01/13/13-02/09/13   Left Breast  . Scoliosis   . Squamous cell carcinoma    "abdomen" (11/18/2012)  . Stress incontinence   . Urinary frequency   . Urinary urgency   .  Wears glasses     PAST SURGICAL HISTORY: Past Surgical History:  Procedure Laterality Date  . APPENDECTOMY    . BASAL CELL CARCINOMA EXCISION     "right face; RLE" (11/18/2012)  . BREAST BIOPSY Right ~ 1962   "benign" (11/18/2012)  . BREAST BIOPSY Right 10/2012   "benign cyst" (11/18/2012)  . BREAST BIOPSY Left 10/2012  . BREAST EXCISIONAL BIOPSY    . BREAST LUMPECTOMY Left 11/18/2012   "w/sentinel node bx" (11/18/2012)  . BREAST LUMPECTOMY WITH NEEDLE LOCALIZATION AND AXILLARY SENTINEL LYMPH NODE BX Left  11/18/2012   Procedure: LEFT BREAST LUMPECTOMY WITH NEEDLE LOCALIZATION AND AXILLARY SENTINEL LYMPH NODE BX;  Surgeon: Rolm Bookbinder, MD;  Location: [facility];  Service: General;  Laterality: Left;  . CARDIAC CATHETERIZATION  03/2010  . CHOLECYSTECTOMY    . COLONOSCOPY     "I've had a few; last one was 2010" (11/18/2012)  . DILATION AND CURETTAGE OF UTERUS    . LUNG BIOPSY Right 2009  . LUNG REMOVAL, PARTIAL Right 2009   upper lobe  . RE-EXCISION OF BREAST LUMPECTOMY Left 12/10/2012   Procedure: RE-EXCISION OF BREAST LUMPECTOMY;  Surgeon: Rolm Bookbinder, MD;  Location: [facility];  Service: General;  Laterality: Left;  . SQUAMOUS CELL CARCINOMA EXCISION     "?abdomen" (11/18/2012)  . THYMECTOMY  ~ 1994  . TONSILLECTOMY AND ADENOIDECTOMY    . VAGINAL HYSTERECTOMY  1974    FAMILY HISTORY Family History  Problem Relation Age of Onset  . Hypertension Mother   . Colon cancer Mother 74  . Breast cancer Mother        Age late 43's  . Heart disease Father    the patient's father died at the age of 4 from a myocardial infarction. The patient's mother died at the age of 14 from Alzheimer's disease. She had been diagnosed with colon cancer at the age of 70 and breast cancer at the age of 66. The patient had one brother who died at birth. No sisters. There is no history of ovarian cancer in the family.  GYNECOLOGIC HISTORY:  Menarche age 42, menopause in her early 67s. She tells me she took birth control pills briefly and developed a clot. She took hormone replacement approximately 10 years, however, without complications.. The patient is GX P2, with first live birth at age 59.  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 Karsten Fells. Her husband Rip Harbour "Ray" Angelos is a retired Programmer, applications. Son Reygan Heagle lives in [place] where he works in Engineer, technical sales. Son [name] lives in [place] where  he is a Tree surgeon. The patient has 3 grandchildren. She attends the [employer]   HEALTH MAINTENANCE: Social History  Substance Use Topics  . Smoking status: Never Smoker  . Smokeless tobacco: Never Used  . Alcohol use 0.0 oz/week     Comment: rare     Colonoscopy: Due December 2015  PAP:  Bone density: 11/22/2014  Lipid panel:  Allergies  Allergen Reactions  . Augmentin [Amoxicillin-Pot Clavulanate] Rash  . Codeine Rash    REACTION: hyperactivity, itching    Current Outpatient Prescriptions  Medication Sig Dispense Refill  . acetaminophen (TYLENOL) 500 MG tablet Take 1,000 mg by mouth every 6 (six) hours as needed for pain.    Marland Kitchen anastrozole (ARIMIDEX) 1 MG tablet TAKE 1 TABLET (  1 MG TOTAL) BY MOUTH DAILY. 90 tablet 3  . aspirin 81 MG tablet Take 81 mg by mouth daily.    Marland Kitchen atorvastatin (LIPITOR) 10 MG tablet Take 10 mg by mouth daily.      . Calcium Carbonate-Vit D-Min (CALTRATE PLUS PO) Take 600 mg by mouth 2 (two) times daily.     . Multiple Vitamins-Minerals (CENTRUM PO) Take by mouth daily.      . potassium chloride SA (K-DUR,KLOR-CON) 20 MEQ tablet Take 20 mEq by mouth 2 (two) times daily.    . propranolol (INDERAL) 20 MG tablet Take 20 mg by mouth 2 (two) times daily.     Marland Kitchen triamterene-hydrochlorothiazide (MAXZIDE) 75-50 MG per tablet Take 0.5 tablets by mouth daily.     . valsartan (DIOVAN) 80 MG tablet Take 80 mg by mouth daily.      Marland Kitchen venlafaxine (EFFEXOR) 37.5 MG tablet TAKE 1 TABLET (37.5 MG TOTAL) BY MOUTH 2 (TWO) TIMES DAILY. 60 tablet 6  . verapamil (CALAN-SR) 180 MG CR tablet Take 180 mg by mouth daily.     . verapamil (VERELAN PM) 180 MG 24 hr capsule      No current facility-administered medications for this visit.    Facility-Administered Medications Ordered in Other Visits  Medication Dose Route Frequency Provider Last Rate Last Dose  . topical emolient (BIAFINE) emulsion   Topical PRN Thea Silversmith, MD        OBJECTIVE: Older white woman in no acute distress  Note that EPIC was down at the time of this visit. The patient's vitals will be entered separately   There were no vitals filed for this visit.   There is no height or weight on file to calculate BMI.    ECOG FS: 1  Sclerae unicteric, EOMs intact Oropharynx clear and moist No cervical or supraclavicular adenopathy Lungs no rales or rhonchi Heart regular rate and rhythm Abd soft, nontender, positive bowel sounds MSK no focal spinal tenderness, no upper extremity lymphedema Neuro: nonfocal, well oriented, appropriate affect Breasts: The right breast is benign. The left breast as undergone lumpectomy and radiation, with no evidence of local recurrence. Both axillae are benign.  LAB RESULTS:  CMP     Component Value Date/Time   NA 146 (H) 11/20/2016 1422   K 4.5 11/20/2016 1422   CL 102 10/24/2007 0440   CO2 30 (H) 11/20/2016 1422   GLUCOSE 103 11/20/2016 1422   BUN 18.1 11/20/2016 1422   CREATININE 1.0 11/20/2016 1422   CALCIUM 10.5 (H) 11/20/2016 1422   PROT 7.5 11/20/2016 1422   ALBUMIN 4.1 11/20/2016 1422   AST 23 11/20/2016 1422   ALT 19 11/20/2016 1422   ALKPHOS 109 11/20/2016 1422   BILITOT 0.41 11/20/2016 1422   GFRNONAA 53 (L) 10/24/2007 0440   GFRAA  10/24/2007 0440    >60        The eGFR has been calculated using the MDRD equation. This calculation has not been validated in all clinical    I No results found for: SPEP  Lab Results  Component Value Date   WBC 5.9 11/20/2016   NEUTROABS 3.0 11/20/2016   HGB 13.5 11/20/2016   HCT 40.9 11/20/2016   MCV 90.2 11/20/2016   PLT 241 11/20/2016      Chemistry      Component Value Date/Time   NA 146 (H) 11/20/2016 1422   K 4.5 11/20/2016 1422   CL 102 10/24/2007 0440   CO2 30 (H) 11/20/2016 1422  BUN 18.1 11/20/2016 1422   CREATININE 1.0 11/20/2016 1422      Component Value Date/Time   CALCIUM 10.5 (H) 11/20/2016 1422   ALKPHOS 109  11/20/2016 1422   AST 23 11/20/2016 1422   ALT 19 11/20/2016 1422   BILITOT 0.41 11/20/2016 1422       No results found for: LABCA2  No components found for: LABCA125  No results for input(s): INR in the last 168 hours.  Urinalysis    Component Value Date/Time   COLORURINE YELLOW 10/15/2011 1000   APPEARANCEUR CLEAR 10/15/2011 1000   LABSPEC 1.014 10/15/2011 1000   PHURINE 7.5 10/15/2011 1000   GLUCOSEU NEG 10/15/2011 1000   HGBUR NEG 10/15/2011 1000   BILIRUBINUR NEG 10/15/2011 1000   KETONESUR NEG 10/15/2011 1000   PROTEINUR NEG 10/15/2011 1000   UROBILINOGEN 1 10/15/2011 1000   NITRITE NEG 10/15/2011 1000   LEUKOCYTESUR NEG 10/15/2011 1000    STUDIES: Mm Diag Breast Tomo Bilateral  Result Date: 11/11/2016 CLINICAL DATA:  76 year old female presenting for routine annual evaluation status post left breast lumpectomy in 2014. EXAM: 2D DIGITAL DIAGNOSTIC BILATERAL MAMMOGRAM WITH CAD AND ADJUNCT TOMO COMPARISON:  Previous exam(s). ACR Breast Density Category c: The breast tissue is heterogeneously dense, which may obscure small masses. FINDINGS: The left breast lumpectomy site is stable. No suspicious calcifications, masses or areas of distortion are seen in the bilateral breasts. Mammographic images were processed with CAD. IMPRESSION: Stable left breast lumpectomy site. No mammographic evidence of malignancy in the bilateral breasts. RECOMMENDATION: Diagnostic mammogram is suggested in 1 year. (Code:[id number]-B-01Y) I have discussed the findings and recommendations with the patient. Results were also provided in writing at the conclusion of the visit. If applicable, a reminder letter will be sent to the patient regarding the next appointment. BI-RADS CATEGORY  2: Benign. Electronically Signed   By: Ammie Ferrier M.D.   On: 11/11/2016 10:50   ASSESSMENT: 76 y.o. [place] woman status post upper inner quadrant left breast biopsy 10/28/2012 for a clinical T1 N0, stage IA invasive  ductal carcinoma, grade 2, estrogen receptor 90% positive, progesterone receptor 10% positive, with no HER-2 amplification, and an MIB-1 of 20%  (1) Oncotype DX score of 21 predicts a distant recurrence risk within 10 years of 13% if the patient's only systemic treatment is tamoxifen for 5 years  (2) status post left lumpectomy and sentinel lymph node sampling in 11/18/2012 for a pT2 pN0, stage IIA invasive ductal carcinoma, grade 1,   (3) status post margin clearance 12/10/2012 with a microscopic focus of invasive ductal carcinoma found adjacent to the prior biopsy cavity, and DCIS within 1 mm of the final margin   (4) completed radiation therapy 02/09/2013  (5)  anastrozole started 02/24/2013  (a) osteopenia, with T score of -1.22 October 2012  (b) bone density 11/22/2014 visit T score of -1.2   PLAN: Gussie is now 4 years out from definitive surgery for her breast cancer with no evidence of disease recurrence. This is very favorable.  She is tolerating anastrozole well. The plan will be to continue that for a total of 5 years, which means at the next visit she will be ready to "graduate".  Their home situation now is difficult because of her son's condition. I have suggested they check out the "36 hour day", although that is not a perfect fit to the current problem since Alzheimer's disease is relentlessly progressive and the current situation may not be.  In any case she will return  to see me in October 2019 for her final visit. She knows to call for any problems that may develop before then   Chauncey Cruel, MD   11/27/2016 8:38 PM

## 2016-11-28 DIAGNOSIS — D2371 Other benign neoplasm of skin of right lower limb, including hip: Secondary | ICD-10-CM | POA: Diagnosis not present

## 2016-11-28 DIAGNOSIS — Z85828 Personal history of other malignant neoplasm of skin: Secondary | ICD-10-CM | POA: Diagnosis not present

## 2016-11-28 DIAGNOSIS — L821 Other seborrheic keratosis: Secondary | ICD-10-CM | POA: Diagnosis not present

## 2016-11-28 DIAGNOSIS — L723 Sebaceous cyst: Secondary | ICD-10-CM | POA: Diagnosis not present

## 2016-11-28 DIAGNOSIS — L820 Inflamed seborrheic keratosis: Secondary | ICD-10-CM | POA: Diagnosis not present

## 2016-12-09 DIAGNOSIS — H16223 Keratoconjunctivitis sicca, not specified as Sjogren's, bilateral: Secondary | ICD-10-CM | POA: Diagnosis not present

## 2016-12-09 DIAGNOSIS — H2513 Age-related nuclear cataract, bilateral: Secondary | ICD-10-CM | POA: Diagnosis not present

## 2016-12-09 DIAGNOSIS — H35371 Puckering of macula, right eye: Secondary | ICD-10-CM | POA: Diagnosis not present

## 2016-12-16 ENCOUNTER — Other Ambulatory Visit: Payer: Self-pay | Admitting: *Deleted

## 2016-12-16 DIAGNOSIS — M8580 Other specified disorders of bone density and structure, unspecified site: Secondary | ICD-10-CM

## 2016-12-16 DIAGNOSIS — Z170 Estrogen receptor positive status [ER+]: Principal | ICD-10-CM

## 2016-12-16 DIAGNOSIS — C50212 Malignant neoplasm of upper-inner quadrant of left female breast: Secondary | ICD-10-CM

## 2016-12-16 DIAGNOSIS — Z79811 Long term (current) use of aromatase inhibitors: Secondary | ICD-10-CM

## 2016-12-16 DIAGNOSIS — E2839 Other primary ovarian failure: Secondary | ICD-10-CM

## 2016-12-26 DIAGNOSIS — L720 Epidermal cyst: Secondary | ICD-10-CM | POA: Diagnosis not present

## 2016-12-26 DIAGNOSIS — Z85828 Personal history of other malignant neoplasm of skin: Secondary | ICD-10-CM | POA: Diagnosis not present

## 2016-12-30 ENCOUNTER — Other Ambulatory Visit: Payer: Self-pay | Admitting: Oncology

## 2016-12-30 DIAGNOSIS — C50212 Malignant neoplasm of upper-inner quadrant of left female breast: Secondary | ICD-10-CM

## 2017-01-03 ENCOUNTER — Inpatient Hospital Stay: Admission: RE | Admit: 2017-01-03 | Payer: Medicare Other | Source: Ambulatory Visit

## 2017-01-18 DIAGNOSIS — Z23 Encounter for immunization: Secondary | ICD-10-CM | POA: Diagnosis not present

## 2017-01-23 DIAGNOSIS — M1711 Unilateral primary osteoarthritis, right knee: Secondary | ICD-10-CM | POA: Diagnosis not present

## 2017-01-23 DIAGNOSIS — M170 Bilateral primary osteoarthritis of knee: Secondary | ICD-10-CM | POA: Diagnosis not present

## 2017-01-24 ENCOUNTER — Ambulatory Visit
Admission: RE | Admit: 2017-01-24 | Discharge: 2017-01-24 | Disposition: A | Discharge status: Home / Self Care | Payer: Medicare Other | Source: Ambulatory Visit | Attending: Oncology | Admitting: Oncology

## 2017-01-24 DIAGNOSIS — M85852 Other specified disorders of bone density and structure, left thigh: Secondary | ICD-10-CM | POA: Diagnosis not present

## 2017-01-24 DIAGNOSIS — M8580 Other specified disorders of bone density and structure, unspecified site: Secondary | ICD-10-CM

## 2017-01-24 DIAGNOSIS — Z79811 Long term (current) use of aromatase inhibitors: Secondary | ICD-10-CM

## 2017-01-24 DIAGNOSIS — Z780 Asymptomatic menopausal state: Secondary | ICD-10-CM | POA: Diagnosis not present

## 2017-01-24 DIAGNOSIS — E2839 Other primary ovarian failure: Secondary | ICD-10-CM

## 2017-01-24 DIAGNOSIS — Z170 Estrogen receptor positive status [ER+]: Principal | ICD-10-CM

## 2017-01-24 DIAGNOSIS — C50212 Malignant neoplasm of upper-inner quadrant of left female breast: Secondary | ICD-10-CM

## 2017-02-20 ENCOUNTER — Other Ambulatory Visit: Payer: Self-pay | Admitting: Oncology

## 2017-02-20 NOTE — Progress Notes (Unsigned)
I called Teresa Chapman to discuss her bone density results but there was no answer.  She will call my nurse.  She will increase her vitamin D intake and try to walk more.  She would like to try pharmacologic agent she will let us know and we will bring her in in January to discuss.

## 2017-02-28 DIAGNOSIS — E7849 Other hyperlipidemia: Secondary | ICD-10-CM | POA: Diagnosis not present

## 2017-02-28 DIAGNOSIS — R82998 Other abnormal findings in urine: Secondary | ICD-10-CM | POA: Diagnosis not present

## 2017-02-28 DIAGNOSIS — I10 Essential (primary) hypertension: Secondary | ICD-10-CM | POA: Diagnosis not present

## 2017-02-28 DIAGNOSIS — M859 Disorder of bone density and structure, unspecified: Secondary | ICD-10-CM | POA: Diagnosis not present

## 2017-03-03 ENCOUNTER — Telehealth: Payer: Self-pay | Admitting: *Deleted

## 2017-03-03 NOTE — Telephone Encounter (Signed)
This RN spoke with pt per bone density results with lower T score readings in pt on aromatase inhibitor therapy.  Teresa Chapman currently takes calcium 600 mg with 300 units vita D twice a day.  She exercises 2 x a week with a trainer.  Per discussion of need for increased vitamin d - recommendation given for pt to increase her vitamin d to 2000 units a day and continue her current calcium with vita d.  She needs to incorporate weight bearing exercises at least 3x a week for 20 30 minutes each episode.  Note Shuree states she " has a bad knee so I do not do a lot of walking " - she will discuss above with her trainer for appropriate exercises.  Also informed pt that MD will discuss above further at scheduled visit including possible need for bisphosphonate's.  Leeba verbalized understanding - above reviewed and no further questions at this time.

## 2017-03-07 DIAGNOSIS — Z1389 Encounter for screening for other disorder: Secondary | ICD-10-CM | POA: Diagnosis not present

## 2017-03-07 DIAGNOSIS — I10 Essential (primary) hypertension: Secondary | ICD-10-CM | POA: Diagnosis not present

## 2017-03-07 DIAGNOSIS — M859 Disorder of bone density and structure, unspecified: Secondary | ICD-10-CM | POA: Diagnosis not present

## 2017-03-07 DIAGNOSIS — M179 Osteoarthritis of knee, unspecified: Secondary | ICD-10-CM | POA: Diagnosis not present

## 2017-03-07 DIAGNOSIS — M25561 Pain in right knee: Secondary | ICD-10-CM | POA: Diagnosis not present

## 2017-03-07 DIAGNOSIS — E7849 Other hyperlipidemia: Secondary | ICD-10-CM | POA: Diagnosis not present

## 2017-03-07 DIAGNOSIS — Z0000 Encounter for general adult medical examination without abnormal findings: Secondary | ICD-10-CM | POA: Diagnosis not present

## 2017-03-07 DIAGNOSIS — C50919 Malignant neoplasm of unspecified site of unspecified female breast: Secondary | ICD-10-CM | POA: Diagnosis not present

## 2017-03-07 DIAGNOSIS — Z6831 Body mass index (BMI) 31.0-31.9, adult: Secondary | ICD-10-CM | POA: Diagnosis not present

## 2017-03-14 DIAGNOSIS — Z1212 Encounter for screening for malignant neoplasm of rectum: Secondary | ICD-10-CM | POA: Diagnosis not present

## 2017-03-28 ENCOUNTER — Other Ambulatory Visit: Payer: Self-pay | Admitting: Oncology

## 2017-04-29 DIAGNOSIS — M25561 Pain in right knee: Secondary | ICD-10-CM | POA: Diagnosis not present

## 2017-04-29 DIAGNOSIS — I10 Essential (primary) hypertension: Secondary | ICD-10-CM | POA: Diagnosis not present

## 2017-04-29 DIAGNOSIS — Z6831 Body mass index (BMI) 31.0-31.9, adult: Secondary | ICD-10-CM | POA: Diagnosis not present

## 2017-04-29 DIAGNOSIS — M25531 Pain in right wrist: Secondary | ICD-10-CM | POA: Diagnosis not present

## 2017-04-29 DIAGNOSIS — Z1389 Encounter for screening for other disorder: Secondary | ICD-10-CM | POA: Diagnosis not present

## 2017-04-29 DIAGNOSIS — E7849 Other hyperlipidemia: Secondary | ICD-10-CM | POA: Diagnosis not present

## 2017-06-26 DIAGNOSIS — M25562 Pain in left knee: Secondary | ICD-10-CM | POA: Diagnosis not present

## 2017-06-26 DIAGNOSIS — Z6832 Body mass index (BMI) 32.0-32.9, adult: Secondary | ICD-10-CM | POA: Diagnosis not present

## 2017-07-23 DIAGNOSIS — M79605 Pain in left leg: Secondary | ICD-10-CM | POA: Diagnosis not present

## 2017-08-14 DIAGNOSIS — M170 Bilateral primary osteoarthritis of knee: Secondary | ICD-10-CM | POA: Diagnosis not present

## 2017-09-26 DIAGNOSIS — M1712 Unilateral primary osteoarthritis, left knee: Secondary | ICD-10-CM | POA: Diagnosis not present

## 2017-10-03 DIAGNOSIS — M1712 Unilateral primary osteoarthritis, left knee: Secondary | ICD-10-CM | POA: Diagnosis not present

## 2017-10-10 DIAGNOSIS — M1712 Unilateral primary osteoarthritis, left knee: Secondary | ICD-10-CM | POA: Diagnosis not present

## 2017-10-13 ENCOUNTER — Other Ambulatory Visit: Payer: Self-pay | Admitting: Oncology

## 2017-10-13 DIAGNOSIS — Z853 Personal history of malignant neoplasm of breast: Secondary | ICD-10-CM

## 2017-10-25 ENCOUNTER — Other Ambulatory Visit: Payer: Self-pay | Admitting: Oncology

## 2017-10-28 DIAGNOSIS — R300 Dysuria: Secondary | ICD-10-CM | POA: Diagnosis not present

## 2017-11-07 DIAGNOSIS — Z6831 Body mass index (BMI) 31.0-31.9, adult: Secondary | ICD-10-CM | POA: Diagnosis not present

## 2017-11-07 DIAGNOSIS — R509 Fever, unspecified: Secondary | ICD-10-CM | POA: Diagnosis not present

## 2017-11-07 DIAGNOSIS — R5383 Other fatigue: Secondary | ICD-10-CM | POA: Diagnosis not present

## 2017-11-07 DIAGNOSIS — I10 Essential (primary) hypertension: Secondary | ICD-10-CM | POA: Diagnosis not present

## 2017-11-07 DIAGNOSIS — R300 Dysuria: Secondary | ICD-10-CM | POA: Diagnosis not present

## 2017-11-07 DIAGNOSIS — R002 Palpitations: Secondary | ICD-10-CM | POA: Diagnosis not present

## 2017-11-13 ENCOUNTER — Ambulatory Visit
Admission: RE | Admit: 2017-11-13 | Discharge: 2017-11-13 | Disposition: A | Discharge status: Home / Self Care | Payer: Medicare Other | Source: Ambulatory Visit | Attending: Oncology | Admitting: Oncology

## 2017-11-13 ENCOUNTER — Ambulatory Visit: Admission: RE | Admit: 2017-11-13 | Payer: Medicare Other | Source: Ambulatory Visit

## 2017-11-13 DIAGNOSIS — Z853 Personal history of malignant neoplasm of breast: Secondary | ICD-10-CM

## 2017-11-13 DIAGNOSIS — R922 Inconclusive mammogram: Secondary | ICD-10-CM | POA: Diagnosis not present

## 2017-11-14 DIAGNOSIS — R300 Dysuria: Secondary | ICD-10-CM | POA: Diagnosis not present

## 2017-11-14 DIAGNOSIS — N3941 Urge incontinence: Secondary | ICD-10-CM | POA: Diagnosis not present

## 2017-11-20 DIAGNOSIS — M1712 Unilateral primary osteoarthritis, left knee: Secondary | ICD-10-CM | POA: Diagnosis not present

## 2017-11-28 DIAGNOSIS — M25562 Pain in left knee: Secondary | ICD-10-CM | POA: Diagnosis not present

## 2017-12-01 DIAGNOSIS — L820 Inflamed seborrheic keratosis: Secondary | ICD-10-CM | POA: Diagnosis not present

## 2017-12-01 DIAGNOSIS — Z85828 Personal history of other malignant neoplasm of skin: Secondary | ICD-10-CM | POA: Diagnosis not present

## 2017-12-01 DIAGNOSIS — L821 Other seborrheic keratosis: Secondary | ICD-10-CM | POA: Diagnosis not present

## 2017-12-01 DIAGNOSIS — D485 Neoplasm of uncertain behavior of skin: Secondary | ICD-10-CM | POA: Diagnosis not present

## 2017-12-01 DIAGNOSIS — C44212 Basal cell carcinoma of skin of right ear and external auricular canal: Secondary | ICD-10-CM | POA: Diagnosis not present

## 2017-12-10 DIAGNOSIS — H04123 Dry eye syndrome of bilateral lacrimal glands: Secondary | ICD-10-CM | POA: Diagnosis not present

## 2017-12-10 DIAGNOSIS — H35371 Puckering of macula, right eye: Secondary | ICD-10-CM | POA: Diagnosis not present

## 2017-12-10 DIAGNOSIS — H2513 Age-related nuclear cataract, bilateral: Secondary | ICD-10-CM | POA: Diagnosis not present

## 2017-12-10 DIAGNOSIS — H5203 Hypermetropia, bilateral: Secondary | ICD-10-CM | POA: Diagnosis not present

## 2017-12-10 DIAGNOSIS — H16223 Keratoconjunctivitis sicca, not specified as Sjogren's, bilateral: Secondary | ICD-10-CM | POA: Diagnosis not present

## 2017-12-10 DIAGNOSIS — H524 Presbyopia: Secondary | ICD-10-CM | POA: Diagnosis not present

## 2017-12-10 DIAGNOSIS — H52203 Unspecified astigmatism, bilateral: Secondary | ICD-10-CM | POA: Diagnosis not present

## 2017-12-12 DIAGNOSIS — M1712 Unilateral primary osteoarthritis, left knee: Secondary | ICD-10-CM | POA: Diagnosis not present

## 2017-12-15 DIAGNOSIS — Z23 Encounter for immunization: Secondary | ICD-10-CM | POA: Diagnosis not present

## 2017-12-20 ENCOUNTER — Other Ambulatory Visit: Payer: Self-pay | Admitting: Oncology

## 2018-01-01 DIAGNOSIS — M1712 Unilateral primary osteoarthritis, left knee: Secondary | ICD-10-CM | POA: Diagnosis not present

## 2018-01-27 ENCOUNTER — Telehealth: Payer: Self-pay | Admitting: Oncology

## 2018-01-27 NOTE — Telephone Encounter (Signed)
Returned pts call to r/s appts   °

## 2018-01-29 ENCOUNTER — Other Ambulatory Visit: Payer: Medicare Other

## 2018-01-29 ENCOUNTER — Ambulatory Visit: Payer: Medicare Other | Admitting: Oncology

## 2018-01-29 DIAGNOSIS — M25562 Pain in left knee: Secondary | ICD-10-CM | POA: Diagnosis not present

## 2018-02-01 NOTE — Progress Notes (Signed)
Patient ID: Teresa Chapman, female   DOB: 1940/09/22, 77 y.o.   MRN: [id number] ID: Teresa Chapman OB: 02/12/1941  MR#: [id number]  JKK#:[id number]  PCP: Leanna Battles, MD GYN:  Donalynn Furlong SU: Rolm Bookbinder OTHER MD: Thea Silversmith, Wilhemina Bonito  CHIEF COMPLAINT: left breast cancer  CURRENT TREATMENT: anastrozole   BREAST CANCER HISTORY: From the original intake note:  Teresa Chapman had bilateral screening mammography at the [facility] Center 10/06/2012 showing a possible mass in the left breast. Additional views showed that the right breast mass was a simple cyst. However spot compression views on the left showed an area of increased density in the upper inner quadrant. There was no palpable mass on physical exam. Ultrasound however confirmed a hypoechoic irregular mass in the left breast measuring 1.3 cm. Biopsy of this mass 10/28/2012 showed an invasive ductal carcinoma, grade 2, estrogen receptor 90% positive, progesterone receptor 10% positive, with an MIB-1 of 20% and no HER-2 amplification.  On 11/03/2012 the patient underwent bilateral breast MRI this showed only the left breast mass in question, which measured 1.9 cm on this modality. There were no abnormal appearing lymph nodes.  The patient's subsequent history is as detailed below  INTERVAL HISTORY: Teresa Chapman returns today for follow-up and treatment of her estrogen receptor positive breast cancer, accompanied by her husband Ray. She stopped taking Anastrozole in September and she hasn't noticed a vast difference in her symptoms. She notes occasional hot flashes and she denies vaginal dryness. She is also taking Effexor without any issues at this time.   Since her last visit to the office, she had a bone density scan on 01/24/2017 that showed a T-score of -1.9.   She also had a diagnostic bilateral mammogram on 11/13/2017 that showed: Breast density category C. No evidence of malignancy within either breast.   She will have a left knee  replacement completed by Dr. Maureen Ralphs on 03/09/2018,   REVIEW OF SYSTEMS: Teresa Chapman reports that she is not able to complete much exercise due to her left knee. She has been doing well overall. She denies unusual headaches, visual changes, nausea, vomiting, or dizziness. There has been no unusual cough, phlegm production, or pleurisy. This been no change in bowel or bladder habits. She denies unexplained fatigue or unexplained weight loss, bleeding, rash, or fever. A detailed review of systems was otherwise stable.    PAST MEDICAL HISTORY: Past Medical History:  Diagnosis Date  . Anxiety    "related to breast cancer; totally unexpected" (11/18/2012)  . Arthritis    right wrist and finger joints  . Atrophic vaginitis   . Basal cell cancer    "right face; RLE" (11/18/2012)  . Breast cancer, left breast ([facility]) 10/2012  . Cataracts, bilateral    immature  . Diverticulosis   . DVT (deep venous thrombosis) ([facility])    "? right side; years and years ago" (11/18/2012)  . Dysrhythmia    hx pvc  . Exertional shortness of breath   . Hepatitis B 1970's  . History of blood transfusion 1971   "when my son was born"  (11/18/2012); no abnormal reaction noted  . History of bronchitis 04/23/2012  . History of colon polyps   . History of shingles 3yr ago and in 2013  . Hot flashes   . Hyperlipidemia    takes Atorvastatin daily  . Hypertension    takes Inderal,Maxzide,and Diovan daily  . Insomnia    infreqently takes Lunesta  . Lung cancer ([facility] 2009  . Myocardial  bridge    intra  . Osteoarthritis   . Osteopenia   . Personal history of radiation therapy   . PONV (postoperative nausea and vomiting)   . Radiation 01/13/13-02/09/13   Left Breast  . Scoliosis   . Squamous cell carcinoma    "abdomen" (11/18/2012)  . Stress incontinence   . Urinary frequency   . Urinary urgency   . Wears glasses     PAST SURGICAL HISTORY: Past Surgical History:  Procedure Laterality Date  . APPENDECTOMY    . BASAL  CELL CARCINOMA EXCISION     "right face; RLE" (11/18/2012)  . BREAST BIOPSY Right ~ 1962   "benign" (11/18/2012)  . BREAST BIOPSY Right 10/2012   "benign cyst" (11/18/2012)  . BREAST BIOPSY Left 10/2012  . BREAST EXCISIONAL BIOPSY    . BREAST LUMPECTOMY Left 11/18/2012   "w/sentinel node bx" (11/18/2012)  . BREAST LUMPECTOMY WITH NEEDLE LOCALIZATION AND AXILLARY SENTINEL LYMPH NODE BX Left 11/18/2012   Procedure: LEFT BREAST LUMPECTOMY WITH NEEDLE LOCALIZATION AND AXILLARY SENTINEL LYMPH NODE BX;  Surgeon: Rolm Bookbinder, MD;  Location: [facility];  Service: General;  Laterality: Left;  . CARDIAC CATHETERIZATION  03/2010  . CHOLECYSTECTOMY    . COLONOSCOPY     "I've had a few; last one was 2010" (11/18/2012)  . DILATION AND CURETTAGE OF UTERUS    . LUNG BIOPSY Right 2009  . LUNG REMOVAL, PARTIAL Right 2009   upper lobe  . RE-EXCISION OF BREAST LUMPECTOMY Left 12/10/2012   Procedure: RE-EXCISION OF BREAST LUMPECTOMY;  Surgeon: Rolm Bookbinder, MD;  Location: [facility];  Service: General;  Laterality: Left;  . SQUAMOUS CELL CARCINOMA EXCISION     "?abdomen" (11/18/2012)  . THYMECTOMY  ~ 1994  . TONSILLECTOMY AND ADENOIDECTOMY    . VAGINAL HYSTERECTOMY  1974    FAMILY HISTORY Family History  Problem Relation Age of Onset  . Hypertension Mother   . Colon cancer Mother 70  . Breast cancer Mother        Age late 75's  . Heart disease Father    the patient's father died at the age of 57 from a myocardial infarction. The patient's mother died at the age of 85 from Alzheimer's disease. She had been diagnosed with colon cancer at the age of 64 and breast cancer at the age of 95. The patient had one brother who died at birth. No sisters. There is no history of ovarian cancer in the family.  GYNECOLOGIC HISTORY:  Menarche age 43, menopause in her early 60s. She tells me she took birth control pills briefly and developed a clot. She took hormone replacement approximately 10 years,  however, without complications.. The patient is GX P2, with first live birth at age 62.  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 Karsten Fells. Her husband Rip Harbour "Ray" Nall is a retired Programmer, applications. Son Latorsha Curling early lives in [place] where he works in Engineer, technical sales.  He is currently staying with the patient.  Son [name] lives in [place] where he is a Tree surgeon. The patient has 3 grandchildren. She attends the [name]     ADVANCED DIRECTIVES: In place   HEALTH MAINTENANCE: Social History   Tobacco Use  . Smoking status: Never Smoker  . Smokeless tobacco: Never Used  Substance Use Topics  . Alcohol use: Yes    Alcohol/week: 0.0 standard  drinks    Comment: rare  . Drug use: No     Colonoscopy: Due December 2015  PAP:  Bone density: 11/22/2014  Lipid panel:  Allergies  Allergen Reactions  . Augmentin [Amoxicillin-Pot Clavulanate] Rash  . Codeine Rash    REACTION: hyperactivity, itching    Current Outpatient Medications  Medication Sig Dispense Refill  . acetaminophen (TYLENOL) 500 MG tablet Take 1,000 mg by mouth every 6 (six) hours as needed for pain.    Marland Kitchen anastrozole (ARIMIDEX) 1 MG tablet TAKE 1 TABLET (1 MG TOTAL) BY MOUTH DAILY. 90 tablet 3  . anastrozole (ARIMIDEX) 1 MG tablet TAKE 1 TABLET (1 MG TOTAL) BY MOUTH DAILY. 90 tablet 2  . aspirin 81 MG tablet Take 81 mg by mouth daily.    Marland Kitchen atorvastatin (LIPITOR) 10 MG tablet Take 10 mg by mouth daily.      . Calcium Carbonate-Vit D-Min (CALTRATE PLUS PO) Take 600 mg by mouth 2 (two) times daily.     . Multiple Vitamins-Minerals (CENTRUM PO) Take by mouth daily.      . potassium chloride SA (K-DUR,KLOR-CON) 20 MEQ tablet Take 20 mEq by mouth 2 (two) times daily.    . propranolol (INDERAL) 20 MG tablet Take 20 mg by mouth 2 (two) times daily.     Marland Kitchen triamterene-hydrochlorothiazide (MAXZIDE) 75-50 MG per  tablet Take 0.5 tablets by mouth daily.     . valsartan (DIOVAN) 80 MG tablet Take 80 mg by mouth daily.      Marland Kitchen venlafaxine (EFFEXOR) 37.5 MG tablet TAKE 1 TABLET BY MOUTH TWICE A DAY 180 tablet 3  . verapamil (CALAN-SR) 180 MG CR tablet Take 180 mg by mouth daily.     . verapamil (VERELAN PM) 180 MG 24 hr capsule      No current facility-administered medications for this visit.    Facility-Administered Medications Ordered in Other Visits  Medication Dose Route Frequency Provider Last Rate Last Dose  . topical emolient (BIAFINE) emulsion   Topical PRN Thea Silversmith, MD        OBJECTIVE: Older white woman appears stated age   38:   02/02/18 1408  BP: (!) 154/70  Pulse: 63  Resp: 18  Temp: 97.7 F (36.5 C)  SpO2: 100%     Body mass index is 32.13 kg/m.    ECOG FS: 1  Sclerae unicteric, pupils round and equal No cervical or supraclavicular adenopathy Lungs no rales or rhonchi Heart regular rate and rhythm Abd soft, nontender, positive bowel sounds MSK mild kyphosis but no focal spinal tenderness, no upper extremity lymphedema Neuro: nonfocal, well oriented, appropriate affect Breasts: The right breast is unremarkable.  The left breast is status post lumpectomy and radiation.  There is no evidence of local recurrence.  Both axillae are benign.  LAB RESULTS:  CMP     Component Value Date/Time   NA 143 02/02/2018 1342   NA 146 (H) 11/20/2016 1422   K 3.8 02/02/2018 1342   K 4.5 11/20/2016 1422   CL 104 02/02/2018 1342   CO2 29 02/02/2018 1342   CO2 30 (H) 11/20/2016 1422   GLUCOSE 97 02/02/2018 1342   GLUCOSE 103 11/20/2016 1422   BUN 19 02/02/2018 1342   BUN 18.1 11/20/2016 1422   CREATININE 1.03 (H) 02/02/2018 1342   CREATININE 1.0 11/20/2016 1422   CALCIUM 10.0 02/02/2018 1342   CALCIUM 10.5 (H) 11/20/2016 1422   PROT 7.1 02/02/2018 1342   PROT 7.5 11/20/2016 1422   ALBUMIN  3.9 02/02/2018 1342   ALBUMIN 4.1 11/20/2016 1422   AST 21 02/02/2018 1342    AST 23 11/20/2016 1422   ALT 19 02/02/2018 1342   ALT 19 11/20/2016 1422   ALKPHOS 101 02/02/2018 1342   ALKPHOS 109 11/20/2016 1422   BILITOT 0.4 02/02/2018 1342   BILITOT 0.41 11/20/2016 1422   GFRNONAA 50 (L) 02/02/2018 1342   GFRAA 58 (L) 02/02/2018 1342    I No results found for: SPEP  Lab Results  Component Value Date   WBC 6.6 02/02/2018   NEUTROABS 3.8 02/02/2018   HGB 12.7 02/02/2018   HCT 38.4 02/02/2018   MCV 93.0 02/02/2018   PLT 226 02/02/2018      Chemistry      Component Value Date/Time   NA 143 02/02/2018 1342   NA 146 (H) 11/20/2016 1422   K 3.8 02/02/2018 1342   K 4.5 11/20/2016 1422   CL 104 02/02/2018 1342   CO2 29 02/02/2018 1342   CO2 30 (H) 11/20/2016 1422   BUN 19 02/02/2018 1342   BUN 18.1 11/20/2016 1422   CREATININE 1.03 (H) 02/02/2018 1342   CREATININE 1.0 11/20/2016 1422      Component Value Date/Time   CALCIUM 10.0 02/02/2018 1342   CALCIUM 10.5 (H) 11/20/2016 1422   ALKPHOS 101 02/02/2018 1342   ALKPHOS 109 11/20/2016 1422   AST 21 02/02/2018 1342   AST 23 11/20/2016 1422   ALT 19 02/02/2018 1342   ALT 19 11/20/2016 1422   BILITOT 0.4 02/02/2018 1342   BILITOT 0.41 11/20/2016 1422       No results found for: LABCA2  No components found for: LABCA125  No results for input(s): INR in the last 168 hours.  Urinalysis    Component Value Date/Time   COLORURINE YELLOW 10/15/2011 1000   APPEARANCEUR CLEAR 10/15/2011 1000   LABSPEC 1.014 10/15/2011 1000   PHURINE 7.5 10/15/2011 1000   GLUCOSEU NEG 10/15/2011 1000   HGBUR NEG 10/15/2011 1000   BILIRUBINUR NEG 10/15/2011 1000   KETONESUR NEG 10/15/2011 1000   PROTEINUR NEG 10/15/2011 1000   UROBILINOGEN 1 10/15/2011 1000   NITRITE NEG 10/15/2011 1000   LEUKOCYTESUR NEG 10/15/2011 1000    STUDIES: She had a bone density scan on 01/24/2017 that showed a T-score of -1.9.   She also had a diagnostic bilateral mammogram on 11/13/2017 that showed: Breast density category C.  No evidence of malignancy within either breast.   ASSESSMENT: 77 y.o. [place] woman status post upper inner quadrant left breast biopsy 10/28/2012 for a clinical T1 N0, stage IA invasive ductal carcinoma, grade 2, estrogen receptor 90% positive, progesterone receptor 10% positive, with no HER-2 amplification, and an MIB-1 of 20%  (1) Oncotype DX score of 21 predicts a distant recurrence risk within 10 years of 13% if the patient's only systemic treatment is tamoxifen for 5 years  (2) status post left lumpectomy and sentinel lymph node sampling in 11/18/2012 for a pT2 pN0, stage IIA invasive ductal carcinoma, grade 1,   (3) status post margin clearance 12/10/2012 with a microscopic focus of invasive ductal carcinoma found adjacent to the prior biopsy cavity, and DCIS within 1 mm of the final margin   (4) completed radiation therapy 02/09/2013  (5)  anastrozole started 02/24/2013, completing 5 years October 2019  (a) osteopenia, with T score of -1.22 October 2012  (b) bone density 11/22/2014 visit T score of -1.2   PLAN: Teresa Chapman is now just a little over 5 years  out from definitive surgery for her breast cancer with no evidence of disease recurrence.  This is very favorable.  She has tolerated anastrozole well.  She has moderate osteopenia.  She has not noticed any change in her functional status coming off that medication.  She would like to get off the venlafaxine.  I gave her a taper over the next 5 weeks.  If she has any symptoms while coming off she will call and let us know  Otherwise I am comfortable releasing her to her primary care physician.  He will need in terms of breast cancer follow-up is a yearly physician breast exam and her yearly mammography   I will be glad to see Teresa Chapman again at any point in the future if and when the need arises but as of now are making no further routine appointments for her here.  Magrinat, Virgie Dad, MD  02/02/18 2:37 PM Medical Oncology and  Hematology [facility] [address] [place],  [address] Tel. [phone]    Fax. [phone]    I, Soijett Blue am acting as scribe for Dr. Sarajane Jews C. Magrinat.  I, Lurline Del MD, have reviewed the above documentation for accuracy and completeness, and I agree with the above.

## 2018-02-02 ENCOUNTER — Encounter: Payer: Self-pay | Admitting: Oncology

## 2018-02-02 ENCOUNTER — Inpatient Hospital Stay (HOSPITAL_BASED_OUTPATIENT_CLINIC_OR_DEPARTMENT_OTHER): Payer: Medicare Other | Admitting: Oncology

## 2018-02-02 ENCOUNTER — Inpatient Hospital Stay: Payer: Medicare Other | Attending: Oncology

## 2018-02-02 VITALS — BP 154/70 | HR 63 | Temp 97.7°F | Resp 18 | Ht 60.0 in | Wt 164.5 lb

## 2018-02-02 DIAGNOSIS — Z853 Personal history of malignant neoplasm of breast: Secondary | ICD-10-CM | POA: Insufficient documentation

## 2018-02-02 DIAGNOSIS — Z85118 Personal history of other malignant neoplasm of bronchus and lung: Secondary | ICD-10-CM | POA: Insufficient documentation

## 2018-02-02 DIAGNOSIS — M8580 Other specified disorders of bone density and structure, unspecified site: Secondary | ICD-10-CM | POA: Diagnosis not present

## 2018-02-02 DIAGNOSIS — Z85828 Personal history of other malignant neoplasm of skin: Secondary | ICD-10-CM | POA: Insufficient documentation

## 2018-02-02 DIAGNOSIS — C50212 Malignant neoplasm of upper-inner quadrant of left female breast: Secondary | ICD-10-CM

## 2018-02-02 DIAGNOSIS — I10 Essential (primary) hypertension: Secondary | ICD-10-CM

## 2018-02-02 DIAGNOSIS — Z923 Personal history of irradiation: Secondary | ICD-10-CM | POA: Insufficient documentation

## 2018-02-02 DIAGNOSIS — Z800 Family history of malignant neoplasm of digestive organs: Secondary | ICD-10-CM | POA: Diagnosis not present

## 2018-02-02 DIAGNOSIS — Z170 Estrogen receptor positive status [ER+]: Principal | ICD-10-CM

## 2018-02-02 LAB — CBC WITH DIFFERENTIAL/PLATELET
Abs Immature Granulocytes: 0.01 10*3/uL (ref 0.00–0.07)
BASOS ABS: 0 10*3/uL (ref 0.0–0.1)
Basophils Relative: 0 %
EOS ABS: 0.1 10*3/uL (ref 0.0–0.5)
EOS PCT: 2 %
HCT: 38.4 % (ref 36.0–46.0)
HEMOGLOBIN: 12.7 g/dL (ref 12.0–15.0)
Immature Granulocytes: 0 %
LYMPHS ABS: 2.1 10*3/uL (ref 0.7–4.0)
Lymphocytes Relative: 32 %
MCH: 30.8 pg (ref 26.0–34.0)
MCHC: 33.1 g/dL (ref 30.0–36.0)
MCV: 93 fL (ref 80.0–100.0)
MONO ABS: 0.6 10*3/uL (ref 0.1–1.0)
Monocytes Relative: 9 %
NRBC: 0 % (ref 0.0–0.2)
Neutro Abs: 3.8 10*3/uL (ref 1.7–7.7)
Neutrophils Relative %: 57 %
Platelets: 226 10*3/uL (ref 150–400)
RBC: 4.13 MIL/uL (ref 3.87–5.11)
RDW: 14.5 % (ref 11.5–15.5)
WBC: 6.6 10*3/uL (ref 4.0–10.5)

## 2018-02-02 LAB — COMPREHENSIVE METABOLIC PANEL
ALBUMIN: 3.9 g/dL (ref 3.5–5.0)
ALK PHOS: 101 U/L (ref 38–126)
ALT: 19 U/L (ref 0–44)
AST: 21 U/L (ref 15–41)
Anion gap: 10 (ref 5–15)
BUN: 19 mg/dL (ref 8–23)
CALCIUM: 10 mg/dL (ref 8.9–10.3)
CO2: 29 mmol/L (ref 22–32)
CREATININE: 1.03 mg/dL — AB (ref 0.44–1.00)
Chloride: 104 mmol/L (ref 98–111)
GFR, EST AFRICAN AMERICAN: 58 mL/min — AB (ref >60)
GFR, EST NON AFRICAN AMERICAN: 50 mL/min — AB (ref >60)
Glucose, Bld: 97 mg/dL (ref 70–99)
Potassium: 3.8 mmol/L (ref 3.5–5.1)
Sodium: 143 mmol/L (ref 135–145)
Total Bilirubin: 0.4 mg/dL (ref 0.3–1.2)
Total Protein: 7.1 g/dL (ref 6.5–8.1)

## 2018-02-03 ENCOUNTER — Telehealth: Payer: Self-pay | Admitting: Oncology

## 2018-02-03 NOTE — Telephone Encounter (Signed)
Per 10/14 no los °

## 2018-02-17 NOTE — H&P (Signed)
TOTAL KNEE ADMISSION H&P  Patient is being admitted for left total knee arthroplasty.  Subjective:  Chief Complaint:left knee pain.  HPI: Teresa Chapman, 77 y.o. female, has a history of pain and functional disability in the left knee due to arthritis and has failed non-surgical conservative treatments for greater than 12 weeks to includecorticosteriod injections, viscosupplementation injections, use of assistive devices and activity modification.  Onset of symptoms was gradual, starting several years ago with gradually worsening course since that time. The patient noted no past surgery on the left knee(s).  Patient currently rates pain in the left knee(s) at 10 out of 10 with activity. Patient has worsening of pain with activity and weight bearing, crepitus and joint swelling.  Patient has evidence of collapsed to bone-on-bone with loss of nearly all the articular cartilage throughout most of her lateral compartment. There is also a large effusion and baker's cyst as well as significant patellofemoral degeneration by imaging studies. There is no active infection.  Patient Active Problem List   Diagnosis Date Noted  . Malignant neoplasm of upper-inner quadrant of left breast in female, estrogen receptor positive ([place]) 12/23/2012  . Hypertension   . Osteopenia   . Heart defect   . Atrophic vaginitis   . Palpitations    Past Medical History:  Diagnosis Date  . Anxiety    "related to breast cancer; totally unexpected" (11/18/2012)  . Arthritis    right wrist and finger joints  . Atrophic vaginitis   . Basal cell cancer    "right face; RLE" (11/18/2012)  . Breast cancer, left breast ([place]) 10/2012  . Cataracts, bilateral    immature  . Diverticulosis   . DVT (deep venous thrombosis) ([place])    "? right side; years and years ago" (11/18/2012)  . Dysrhythmia    hx pvc  . Exertional shortness of breath   . Hepatitis B 1970's  . History of blood transfusion 1971   "when my son was born"   (11/18/2012); no abnormal reaction noted  . History of bronchitis 04/23/2012  . History of colon polyps   . History of shingles 58yrs ago and in 2013  . Hot flashes   . Hyperlipidemia    takes Atorvastatin daily  . Hypertension    takes Inderal,Maxzide,and Diovan daily  . Insomnia    infreqently takes Lunesta  . Lung cancer ([place]) 2009  . Myocardial bridge    intra  . Osteoarthritis   . Osteopenia   . Personal history of radiation therapy   . PONV (postoperative nausea and vomiting)   . Radiation 01/13/13-02/09/13   Left Breast  . Scoliosis   . Squamous cell carcinoma    "abdomen" (11/18/2012)  . Stress incontinence   . Urinary frequency   . Urinary urgency   . Wears glasses     Past Surgical History:  Procedure Laterality Date  . APPENDECTOMY    . BASAL CELL CARCINOMA EXCISION     "right face; RLE" (11/18/2012)  . BREAST BIOPSY Right ~ 1962   "benign" (11/18/2012)  . BREAST BIOPSY Right 10/2012   "benign cyst" (11/18/2012)  . BREAST BIOPSY Left 10/2012  . BREAST EXCISIONAL BIOPSY    . BREAST LUMPECTOMY Left 11/18/2012   "w/sentinel node bx" (11/18/2012)  . BREAST LUMPECTOMY WITH NEEDLE LOCALIZATION AND AXILLARY SENTINEL LYMPH NODE BX Left 11/18/2012   Procedure: LEFT BREAST LUMPECTOMY WITH NEEDLE LOCALIZATION AND AXILLARY SENTINEL LYMPH NODE BX;  Surgeon: Rolm Bookbinder, MD;  Location: [facility];  Service: General;  Laterality: Left;  . CARDIAC CATHETERIZATION  03/2010  . CHOLECYSTECTOMY    . COLONOSCOPY     "I've had a few; last one was 2010" (11/18/2012)  . DILATION AND CURETTAGE OF UTERUS    . LUNG BIOPSY Right 2009  . LUNG REMOVAL, PARTIAL Right 2009   upper lobe  . RE-EXCISION OF BREAST LUMPECTOMY Left 12/10/2012   Procedure: RE-EXCISION OF BREAST LUMPECTOMY;  Surgeon: Rolm Bookbinder, MD;  Location: [facility];  Service: General;  Laterality: Left;  . SQUAMOUS CELL CARCINOMA EXCISION     "?abdomen" (11/18/2012)  . THYMECTOMY  ~ 1994  . TONSILLECTOMY AND  ADENOIDECTOMY    . VAGINAL HYSTERECTOMY  1974    No current facility-administered medications for this encounter.    Current Outpatient Medications  Medication Sig Dispense Refill Last Dose  . acetaminophen (TYLENOL) 500 MG tablet Take 1,000 mg by mouth every 6 (six) hours as needed for pain.   Unknown  . anastrozole (ARIMIDEX) 1 MG tablet TAKE 1 TABLET (1 MG TOTAL) BY MOUTH DAILY. 90 tablet 3   . anastrozole (ARIMIDEX) 1 MG tablet TAKE 1 TABLET (1 MG TOTAL) BY MOUTH DAILY. 90 tablet 2   . aspirin 81 MG tablet Take 81 mg by mouth daily.   03/28/2014  . atorvastatin (LIPITOR) 10 MG tablet Take 10 mg by mouth daily.     03/28/2014  . Calcium Carbonate-Vit D-Min (CALTRATE PLUS PO) Take 600 mg by mouth 2 (two) times daily.    03/28/2014  . Multiple Vitamins-Minerals (CENTRUM PO) Take by mouth daily.     03/28/2014  . potassium chloride SA (K-DUR,KLOR-CON) 20 MEQ tablet Take 20 mEq by mouth 2 (two) times daily.   03/28/2014  . propranolol (INDERAL) 20 MG tablet Take 20 mg by mouth 2 (two) times daily.    03/29/2014  . triamterene-hydrochlorothiazide (MAXZIDE) 75-50 MG per tablet Take 0.5 tablets by mouth daily.    03/28/2014  . valsartan (DIOVAN) 80 MG tablet Take 80 mg by mouth daily.     03/29/2014  . venlafaxine (EFFEXOR) 37.5 MG tablet TAKE 1 TABLET BY MOUTH TWICE A DAY 180 tablet 3   . verapamil (CALAN-SR) 180 MG CR tablet Take 180 mg by mouth daily.    03/29/2014  . verapamil (VERELAN PM) 180 MG 24 hr capsule       Facility-Administered Medications Ordered in Other Encounters  Medication Dose Route Frequency Provider Last Rate Last Dose  . topical emolient (BIAFINE) emulsion   Topical PRN Thea Silversmith, MD       Allergies  Allergen Reactions  . Augmentin [Amoxicillin-Pot Clavulanate] Rash  . Codeine Rash    REACTION: hyperactivity, itching    Social History   Tobacco Use  . Smoking status: Never Smoker  . Smokeless tobacco: Never Used  Substance Use Topics  . Alcohol use: Yes     Alcohol/week: 0.0 standard drinks    Comment: rare    Family History  Problem Relation Age of Onset  . Hypertension Mother   . Colon cancer Mother 50  . Breast cancer Mother        Age late 27's  . Heart disease Father      Review of Systems  Constitutional: Negative for chills and fever.  HENT: Negative for congestion, sore throat and tinnitus.   Eyes: Negative for double vision, photophobia and pain.  Respiratory: Negative for cough, shortness of breath and wheezing.   Cardiovascular: Negative for chest pain, palpitations and orthopnea.  Gastrointestinal:  Negative for heartburn, nausea and vomiting.  Genitourinary: Negative for dysuria, frequency and urgency.  Musculoskeletal: Positive for joint pain.  Neurological: Negative for dizziness, weakness and headaches.    Objective:  Physical Exam  Well nourished and well developed. General: Alert and oriented x3, cooperative and pleasant, no acute distress. Head: normocephalic, atraumatic, neck supple. Eyes: EOMI. Respiratory: breath sounds clear in all fields, no wheezing, rales, or rhonchi. Cardiovascular: Regular rate and rhythm, no murmurs, gallops or rubs.  Abdomen: non-tender to palpation and soft, normoactive bowel sounds. Musculoskeletal: Bilateral Hip Exam: ROM: is normal without discomfort. There is no tenderness over the greater trochanter bursa. There is no pain on provocative testing of the hip. Right Knee Exam: No effusion. No Swelling.Range of motion is 0-125 degrees. No crepitus on range of motion of the knee. No medial joint line tenderness. No lateral joint line tenderness. Stable knee. Left Knee Exam: Moderate sized effusion. No Swelling. Range of motion is 5-125 degrees. Moderate crepitus on range of motion of the knee. Tenderness is greater in the lateral than medial joint line. Stable knee. Calves soft and nontender. Motor function intact in LE. Strength 5/5 LE bilaterally. Neuro: Distal pulses 2+. Sensation  to light touch intact in LE.  Vital signs in last 24 hours: Blood pressure: 144/78 mmHg Pulse: 64 bpm  Labs:   Estimated body mass index is 32.13 kg/m as calculated from the following:   Height as of 02/02/18: 5' (1.524 m).   Weight as of 02/02/18: 74.6 kg.   Imaging Review Plain radiographs demonstrate severe degenerative joint disease of the left knee(s). The overall alignment isneutral. The bone quality appears to be adequate for age and reported activity level.   Preoperative templating of the joint replacement has been completed, documented, and submitted to the Operating Room personnel in order to optimize intra-operative equipment management.   Anticipated LOS equal to or greater than 2 midnights due to - Age 46 and older with one or more of the following:  - Obesity  - Expected need for hospital services (PT, OT, Nursing) required for safe  discharge  - Anticipated need for postoperative skilled nursing care or inpatient rehab  - Active co-morbidities: DVT/VTE OR   - Unanticipated findings during/Post Surgery: None  - Patient is a high risk of re-admission due to: None     Assessment/Plan:  End stage arthritis, left knee   The patient history, physical examination, clinical judgment of the provider and imaging studies are consistent with end stage degenerative joint disease of the left knee(s) and total knee arthroplasty is deemed medically necessary. The treatment options including medical management, injection therapy arthroscopy and arthroplasty were discussed at length. The risks and benefits of total knee arthroplasty were presented and reviewed. The risks due to aseptic loosening, infection, stiffness, patella tracking problems, thromboembolic complications and other imponderables were discussed. The patient acknowledged the explanation, agreed to proceed with the plan and consent was signed. Patient is being admitted for inpatient treatment for surgery, pain  control, PT, OT, prophylactic antibiotics, VTE prophylaxis, progressive ambulation and ADL's and discharge planning. The patient is planning to be discharged home with outpatient physical therapy.   Therapy Plans: outpatient physical therapy at [facility] Disposition: Home with husband Planned DVT Prophylaxis: Xarelto 10 mg daily (hx DVT and breast CA) DME needed: None PCP: Dr. Bevelyn Buckles TXA: Topical (hx DVT) Allergies: Augmentin (rash), codeine (itching) Anesthesia Concerns: None Other: NO BP OR IV STICKS LEFT ARM Patient has not been medically cleared for  surgery by Dr. Sharlett Iles, clearance form provided to patient and instructed patient to make appointment with him.   - Patient was instructed on what medications to stop prior to surgery. - Follow-up visit in 2 weeks with Dr. Wynelle Link - Begin physical therapy following surgery - Pre-operative lab work as pre-surgical testing - Prescriptions will be provided in hospital at time of discharge  Theresa Duty, PA-C Orthopedic Surgery [facility] [place]

## 2018-02-23 DIAGNOSIS — Z6831 Body mass index (BMI) 31.0-31.9, adult: Secondary | ICD-10-CM | POA: Diagnosis not present

## 2018-02-23 DIAGNOSIS — I10 Essential (primary) hypertension: Secondary | ICD-10-CM | POA: Diagnosis not present

## 2018-02-23 DIAGNOSIS — C50919 Malignant neoplasm of unspecified site of unspecified female breast: Secondary | ICD-10-CM | POA: Diagnosis not present

## 2018-02-23 DIAGNOSIS — E7849 Other hyperlipidemia: Secondary | ICD-10-CM | POA: Diagnosis not present

## 2018-02-23 DIAGNOSIS — M25562 Pain in left knee: Secondary | ICD-10-CM | POA: Diagnosis not present

## 2018-03-03 NOTE — Patient Instructions (Addendum)
Teresa Chapman  04-13-1941    Your procedure is scheduled on:  03-09-2018   Report to [facility] Main  Entrance, Report to admitting at  9:00 AM     Call this number if you have problems the morning of surgery [phone]       Remember: Do not eat food or drink liquids :After Midnight.                                       BRUSH YOUR TEETH MORNING OF SURGERY AND RINSE YOUR MOUTH OUT, NO CHEWING GUM CANDY OR MINTS.     Take these medicines the morning of surgery with A SIP OF WATER:  Venlafaxine (effexor), Propranolol, Verapamil                                   You may not have any metal on your body including hair pins and piercings               Do not wear jewelry, make-up, lotions, powders or perfumes, deodorant              Do not wear nail polish.  Do not shave  48 hours prior to surgery.        Do not bring valuables to the hospital. [place].  Contacts, dentures or bridgework may not be worn into surgery.  Leave suitcase in the car. After surgery it may be brought to your room.      _____________________________________________________________________             [facility] - Preparing for Surgery Before surgery, you can play an important role.  Because skin is not sterile, your skin needs to be as free of germs as possible.  You can reduce the number of germs on your skin by washing with CHG (chlorahexidine gluconate) soap before surgery.  CHG is an antiseptic cleaner which kills germs and bonds with the skin to continue killing germs even after washing. Please DO NOT use if you have an allergy to CHG or antibacterial soaps.  If your skin becomes reddened/irritated stop using the CHG and inform your nurse when you arrive at Short Stay. Do not shave (including legs and underarms) for at least 48 hours prior to the first CHG shower.  You may shave your face/neck. Please follow  these instructions carefully:  1.  Shower with CHG Soap the night before surgery and the  morning of Surgery.  2.  If you choose to wash your hair, wash your hair first as usual with your  normal  shampoo.  3.  After you shampoo, rinse your hair and body thoroughly to remove the  shampoo.                            4.  Use CHG as you would any other liquid soap.  You can apply chg directly  to the skin and wash                       Gently with a  scrungie or clean washcloth.  5.  Apply the CHG Soap to your body ONLY FROM THE NECK DOWN.   Do not use on face/ open                           Wound or open sores. Avoid contact with eyes, ears mouth and genitals (private parts).                       Wash face,  Genitals (private parts) with your normal soap.             6.  Wash thoroughly, paying special attention to the area where your surgery  will be performed.  7.  Thoroughly rinse your body with warm water from the neck down.  8.  DO NOT shower/wash with your normal soap after using and rinsing off  the CHG Soap.             9.  Pat yourself dry with a clean towel.            10.  Wear clean pajamas.            11.  Place clean sheets on your bed the night of your first shower and do not  sleep with pets. Day of Surgery : Do not apply any lotions/deodorants the morning of surgery.  Please wear clean clothes to the hospital/surgery center.  FAILURE TO FOLLOW THESE INSTRUCTIONS MAY RESULT IN THE CANCELLATION OF YOUR SURGERY PATIENT SIGNATURE_________________________________  NURSE SIGNATURE__________________________________  ________________________________________________________________________   Adam Phenix  An incentive spirometer is a tool that can help keep your lungs clear and active. This tool measures how well you are filling your lungs with each breath. Taking long deep breaths may help reverse or decrease the chance of developing breathing (pulmonary) problems  (especially infection) following:  A long period of time when you are unable to move or be active. BEFORE THE PROCEDURE   If the spirometer includes an indicator to show your best effort, your nurse or respiratory therapist will set it to a desired goal.  If possible, sit up straight or lean slightly forward. Try not to slouch.  Hold the incentive spirometer in an upright position. INSTRUCTIONS FOR USE  1. Sit on the edge of your bed if possible, or sit up as far as you can in bed or on a chair. 2. Hold the incentive spirometer in an upright position. 3. Breathe out normally. 4. Place the mouthpiece in your mouth and seal your lips tightly around it. 5. Breathe in slowly and as deeply as possible, raising the piston or the ball toward the top of the column. 6. Hold your breath for 3-5 seconds or for as long as possible. Allow the piston or ball to fall to the bottom of the column. 7. Remove the mouthpiece from your mouth and breathe out normally. 8. Rest for a few seconds and repeat Steps 1 through 7 at least 10 times every 1-2 hours when you are awake. Take your time and take a few normal breaths between deep breaths. 9. The spirometer may include an indicator to show your best effort. Use the indicator as a goal to work toward during each repetition. 10. After each set of 10 deep breaths, practice coughing to be sure your lungs are clear. If you have an incision (the cut made at the time of surgery), support your incision when coughing by  placing a pillow or rolled up towels firmly against it. Once you are able to get out of bed, walk around indoors and cough well. You may stop using the incentive spirometer when instructed by your caregiver.  RISKS AND COMPLICATIONS  Take your time so you do not get dizzy or light-headed.  If you are in pain, you may need to take or ask for pain medication before doing incentive spirometry. It is harder to take a deep breath if you are having  pain. AFTER USE  Rest and breathe slowly and easily.  It can be helpful to keep track of a log of your progress. Your caregiver can provide you with a simple table to help with this. If you are using the spirometer at home, follow these instructions: [place] IF:   You are having difficultly using the spirometer.  You have trouble using the spirometer as often as instructed.  Your pain medication is not giving enough relief while using the spirometer.  You develop fever of 100.5 F (38.1 C) or higher. SEEK IMMEDIATE MEDICAL CARE IF:   You cough up bloody sputum that had not been present before.  You develop fever of 102 F (38.9 C) or greater.  You develop worsening pain at or near the incision site. MAKE SURE YOU:   Understand these instructions.  Will watch your condition.  Will get help right away if you are not doing well or get worse. Document Released: 08/19/2006 Document Revised: 07/01/2011 Document Reviewed: 10/20/2006 Exit[employer] Patient Information 2014 [employer], [place].   ________________________________________________________________________  WHAT IS A BLOOD TRANSFUSION? Blood Transfusion Information  A transfusion is the replacement of blood or some of its parts. Blood is made up of multiple cells which provide different functions.  Red blood cells carry oxygen and are used for blood loss replacement.  White blood cells fight against infection.  Platelets control bleeding.  Plasma helps clot blood.  Other blood products are available for specialized needs, such as hemophilia or other clotting disorders. BEFORE THE TRANSFUSION  Who gives blood for transfusions?   Healthy volunteers who are fully evaluated to make sure their blood is safe. This is blood bank blood. Transfusion therapy is the safest it has ever been in the practice of medicine. Before blood is taken from a donor, a complete history is taken to make sure that person has no history  of diseases nor engages in risky social behavior (examples are intravenous drug use or sexual activity with multiple partners). The donor's travel history is screened to minimize risk of transmitting infections, such as malaria. The donated blood is tested for signs of infectious diseases, such as HIV and hepatitis. The blood is then tested to be sure it is compatible with you in order to minimize the chance of a transfusion reaction. If you or a relative donates blood, this is often done in anticipation of surgery and is not appropriate for emergency situations. It takes many days to process the donated blood. RISKS AND COMPLICATIONS Although transfusion therapy is very safe and saves many lives, the main dangers of transfusion include:   Getting an infectious disease.  Developing a transfusion reaction. This is an allergic reaction to something in the blood you were given. Every precaution is taken to prevent this. The decision to have a blood transfusion has been considered carefully by your caregiver before blood is given. Blood is not given unless the benefits outweigh the risks. AFTER THE TRANSFUSION  Right after receiving a blood  transfusion, you will usually feel much better and more energetic. This is especially true if your red blood cells have gotten low (anemic). The transfusion raises the level of the red blood cells which carry oxygen, and this usually causes an energy increase.  The nurse administering the transfusion will monitor you carefully for complications. HOME CARE INSTRUCTIONS  No special instructions are needed after a transfusion. You may find your energy is better. Speak with your caregiver about any limitations on activity for underlying diseases you may have. SEEK MEDICAL CARE IF:   Your condition is not improving after your transfusion.  You develop redness or irritation at the intravenous (IV) site. SEEK IMMEDIATE MEDICAL CARE IF:  Any of the following symptoms  occur over the next 12 hours:  Shaking chills.  You have a temperature by mouth above 102 F (38.9 C), not controlled by medicine.  Chest, back, or muscle pain.  People around you feel you are not acting correctly or are confused.  Shortness of breath or difficulty breathing.  Dizziness and fainting.  You get a rash or develop hives.  You have a decrease in urine output.  Your urine turns a dark color or changes to pink, red, or brown. Any of the following symptoms occur over the next 10 days:  You have a temperature by mouth above 102 F (38.9 C), not controlled by medicine.  Shortness of breath.  Weakness after normal activity.  The white part of the eye turns yellow (jaundice).  You have a decrease in the amount of urine or are urinating less often.  Your urine turns a dark color or changes to pink, red, or brown. Document Released: 04/05/2000 Document Revised: 07/01/2011 Document Reviewed: 11/23/2007 [facility] Patient Information 2014 [place], [place].  _______________________________________________________________________

## 2018-03-04 ENCOUNTER — Other Ambulatory Visit: Payer: Self-pay

## 2018-03-04 ENCOUNTER — Encounter (HOSPITAL_COMMUNITY): Payer: Self-pay

## 2018-03-04 ENCOUNTER — Encounter (HOSPITAL_COMMUNITY)
Admission: RE | Admit: 2018-03-04 | Discharge: 2018-03-04 | Disposition: A | Discharge status: Home / Self Care | Payer: Medicare Other | Source: Ambulatory Visit | Attending: Orthopedic Surgery | Admitting: Orthopedic Surgery

## 2018-03-04 DIAGNOSIS — R001 Bradycardia, unspecified: Secondary | ICD-10-CM | POA: Insufficient documentation

## 2018-03-04 DIAGNOSIS — R9431 Abnormal electrocardiogram [ECG] [EKG]: Secondary | ICD-10-CM | POA: Insufficient documentation

## 2018-03-04 DIAGNOSIS — Z01818 Encounter for other preprocedural examination: Secondary | ICD-10-CM | POA: Diagnosis not present

## 2018-03-04 DIAGNOSIS — M1712 Unilateral primary osteoarthritis, left knee: Secondary | ICD-10-CM | POA: Insufficient documentation

## 2018-03-04 HISTORY — DX: Personal history of irradiation: Z92.3

## 2018-03-04 HISTORY — DX: Personal history of malignant neoplasm, unspecified: Z85.9

## 2018-03-04 HISTORY — DX: Chronic kidney disease, stage 3 (moderate): N18.3

## 2018-03-04 HISTORY — DX: Personal history of other venous thrombosis and embolism: Z86.718

## 2018-03-04 HISTORY — DX: Malignant neoplasm of upper-inner quadrant of left female breast: C50.212

## 2018-03-04 HISTORY — DX: Personal history of other malignant neoplasm of skin: Z85.828

## 2018-03-04 HISTORY — DX: Stress incontinence (female) (male): N39.3

## 2018-03-04 HISTORY — DX: Estrogen receptor positive status (ER+): Z17.0

## 2018-03-04 HISTORY — DX: Ventricular premature depolarization: I49.3

## 2018-03-04 HISTORY — DX: Personal history of malignant neoplasm, unspecified: Z98.890

## 2018-03-04 HISTORY — DX: Chronic kidney disease, stage 3 unspecified: N18.30

## 2018-03-04 LAB — COMPREHENSIVE METABOLIC PANEL
ALT: 19 U/L (ref 0–44)
ANION GAP: 7 (ref 5–15)
AST: 23 U/L (ref 15–41)
Albumin: 4.2 g/dL (ref 3.5–5.0)
Alkaline Phosphatase: 89 U/L (ref 38–126)
BUN: 21 mg/dL (ref 8–23)
CHLORIDE: 102 mmol/L (ref 98–111)
CO2: 31 mmol/L (ref 22–32)
Calcium: 9.6 mg/dL (ref 8.9–10.3)
Creatinine, Ser: 1.04 mg/dL — ABNORMAL HIGH (ref 0.44–1.00)
GFR, EST AFRICAN AMERICAN: 58 mL/min — AB (ref >60)
GFR, EST NON AFRICAN AMERICAN: 50 mL/min — AB (ref >60)
Glucose, Bld: 79 mg/dL (ref 70–99)
POTASSIUM: 4.3 mmol/L (ref 3.5–5.1)
SODIUM: 140 mmol/L (ref 135–145)
Total Bilirubin: 0.8 mg/dL (ref 0.3–1.2)
Total Protein: 7 g/dL (ref 6.5–8.1)

## 2018-03-04 LAB — APTT: aPTT: 35 seconds (ref 24–36)

## 2018-03-04 LAB — CBC
HCT: 41.2 % (ref 36.0–46.0)
Hemoglobin: 13 g/dL (ref 12.0–15.0)
MCH: 30.4 pg (ref 26.0–34.0)
MCHC: 31.6 g/dL (ref 30.0–36.0)
MCV: 96.5 fL (ref 80.0–100.0)
Platelets: 232 10*3/uL (ref 150–400)
RBC: 4.27 MIL/uL (ref 3.87–5.11)
RDW: 14 % (ref 11.5–15.5)
WBC: 6.1 10*3/uL (ref 4.0–10.5)
nRBC: 0 % (ref 0.0–0.2)

## 2018-03-04 LAB — PROTIME-INR
INR: 1.06
PROTHROMBIN TIME: 13.7 s (ref 11.4–15.2)

## 2018-03-04 LAB — SURGICAL PCR SCREEN
MRSA, PCR: NEGATIVE
Staphylococcus aureus: NEGATIVE

## 2018-03-04 LAB — ABO/RH: ABO/RH(D): O POS

## 2018-03-04 NOTE — Progress Notes (Addendum)
Pt surgical clearance from pcp, dr Leanna Battles, dated 02-23-2018 with chart.   ADDENDUM:  Final EKG dated 03-04-2018 in epic.

## 2018-03-08 MED ORDER — TRANEXAMIC ACID 1000 MG/10ML IV SOLN
2000.0000 mg | INTRAVENOUS | Status: DC
  Filled 2018-03-08: qty 20

## 2018-03-09 ENCOUNTER — Encounter (HOSPITAL_COMMUNITY)
Admission: RE | Disposition: A | Discharge status: Home / Self Care | Payer: Self-pay | Source: Ambulatory Visit | Attending: Orthopedic Surgery

## 2018-03-09 ENCOUNTER — Inpatient Hospital Stay (HOSPITAL_COMMUNITY): Payer: Medicare Other | Admitting: Anesthesiology

## 2018-03-09 ENCOUNTER — Encounter (HOSPITAL_COMMUNITY): Payer: Self-pay | Admitting: *Deleted

## 2018-03-09 ENCOUNTER — Observation Stay (HOSPITAL_COMMUNITY)
Admission: RE | Admit: 2018-03-09 | Discharge: 2018-03-10 | Disposition: A | Discharge status: Home / Self Care | Payer: Medicare Other | Source: Ambulatory Visit | Attending: Orthopedic Surgery | Admitting: Orthopedic Surgery

## 2018-03-09 ENCOUNTER — Other Ambulatory Visit: Payer: Self-pay

## 2018-03-09 DIAGNOSIS — G4700 Insomnia, unspecified: Secondary | ICD-10-CM | POA: Insufficient documentation

## 2018-03-09 DIAGNOSIS — Z923 Personal history of irradiation: Secondary | ICD-10-CM | POA: Insufficient documentation

## 2018-03-09 DIAGNOSIS — I10 Essential (primary) hypertension: Secondary | ICD-10-CM | POA: Diagnosis not present

## 2018-03-09 DIAGNOSIS — M1712 Unilateral primary osteoarthritis, left knee: Secondary | ICD-10-CM | POA: Diagnosis not present

## 2018-03-09 DIAGNOSIS — Z79899 Other long term (current) drug therapy: Secondary | ICD-10-CM | POA: Insufficient documentation

## 2018-03-09 DIAGNOSIS — Z7982 Long term (current) use of aspirin: Secondary | ICD-10-CM | POA: Insufficient documentation

## 2018-03-09 DIAGNOSIS — M1710 Unilateral primary osteoarthritis, unspecified knee: Secondary | ICD-10-CM | POA: Diagnosis present

## 2018-03-09 DIAGNOSIS — E785 Hyperlipidemia, unspecified: Secondary | ICD-10-CM | POA: Insufficient documentation

## 2018-03-09 DIAGNOSIS — Z902 Acquired absence of lung [part of]: Secondary | ICD-10-CM | POA: Diagnosis not present

## 2018-03-09 DIAGNOSIS — G8918 Other acute postprocedural pain: Secondary | ICD-10-CM | POA: Diagnosis not present

## 2018-03-09 DIAGNOSIS — N183 Chronic kidney disease, stage 3 (moderate): Secondary | ICD-10-CM | POA: Diagnosis not present

## 2018-03-09 DIAGNOSIS — Z85828 Personal history of other malignant neoplasm of skin: Secondary | ICD-10-CM | POA: Diagnosis not present

## 2018-03-09 DIAGNOSIS — Z85118 Personal history of other malignant neoplasm of bronchus and lung: Secondary | ICD-10-CM | POA: Diagnosis not present

## 2018-03-09 DIAGNOSIS — M179 Osteoarthritis of knee, unspecified: Secondary | ICD-10-CM | POA: Diagnosis present

## 2018-03-09 DIAGNOSIS — E669 Obesity, unspecified: Secondary | ICD-10-CM | POA: Diagnosis not present

## 2018-03-09 DIAGNOSIS — I129 Hypertensive chronic kidney disease with stage 1 through stage 4 chronic kidney disease, or unspecified chronic kidney disease: Secondary | ICD-10-CM | POA: Diagnosis not present

## 2018-03-09 DIAGNOSIS — F419 Anxiety disorder, unspecified: Secondary | ICD-10-CM | POA: Diagnosis not present

## 2018-03-09 DIAGNOSIS — Z86718 Personal history of other venous thrombosis and embolism: Secondary | ICD-10-CM | POA: Insufficient documentation

## 2018-03-09 DIAGNOSIS — Z853 Personal history of malignant neoplasm of breast: Secondary | ICD-10-CM | POA: Diagnosis not present

## 2018-03-09 HISTORY — PX: TOTAL KNEE ARTHROPLASTY: SHX125

## 2018-03-09 LAB — TYPE AND SCREEN
ABO/RH(D): O POS
Antibody Screen: NEGATIVE

## 2018-03-09 SURGERY — ARTHROPLASTY, KNEE, TOTAL
Anesthesia: Monitor Anesthesia Care | Site: Knee | Laterality: Left

## 2018-03-09 MED ORDER — ROPIVACAINE HCL 7.5 MG/ML IJ SOLN
INTRAMUSCULAR | Status: DC | PRN
  Administered 2018-03-09: 20 mL via PERINEURAL

## 2018-03-09 MED ORDER — FLEET ENEMA 7-19 GM/118ML RE ENEM: 1.0000 | ENEMA | Freq: Once | RECTAL | Status: DC | PRN

## 2018-03-09 MED ORDER — VANCOMYCIN HCL IN DEXTROSE 1-5 GM/200ML-% IV SOLN
1000.0000 mg | INTRAVENOUS | Status: AC
  Administered 2018-03-09 (×2): 1000 mg via INTRAVENOUS
  Filled 2018-03-09: qty 200

## 2018-03-09 MED ORDER — METHOCARBAMOL 500 MG PO TABS
500.0000 mg | ORAL_TABLET | Freq: Four times a day (QID) | ORAL | Status: DC | PRN
  Administered 2018-03-10 (×2): 500 mg via ORAL
  Filled 2018-03-09 (×2): qty 1

## 2018-03-09 MED ORDER — SODIUM CHLORIDE 0.9 % IV SOLN
INTRAVENOUS | Status: DC
  Administered 2018-03-09 – 2018-03-10 (×2): via INTRAVENOUS

## 2018-03-09 MED ORDER — METOCLOPRAMIDE HCL 5 MG/ML IJ SOLN: 5.0000 mg | Freq: Three times a day (TID) | INTRAMUSCULAR | Status: DC | PRN

## 2018-03-09 MED ORDER — GABAPENTIN 300 MG PO CAPS
300.0000 mg | ORAL_CAPSULE | Freq: Every day | ORAL | Status: DC
  Administered 2018-03-09: 300 mg via ORAL
  Filled 2018-03-09: qty 1

## 2018-03-09 MED ORDER — SODIUM CHLORIDE (PF) 0.9 % IJ SOLN
INTRAMUSCULAR | Status: AC
  Filled 2018-03-09: qty 50

## 2018-03-09 MED ORDER — PHENOL 1.4 % MT LIQD: 1.0000 | OROMUCOSAL | Status: DC | PRN

## 2018-03-09 MED ORDER — MENTHOL 3 MG MT LOZG: 1.0000 | LOZENGE | OROMUCOSAL | Status: DC | PRN

## 2018-03-09 MED ORDER — ATORVASTATIN CALCIUM 10 MG PO TABS
10.0000 mg | ORAL_TABLET | Freq: Every day | ORAL | Status: DC
  Filled 2018-03-09: qty 1

## 2018-03-09 MED ORDER — OXYCODONE HCL 5 MG/5ML PO SOLN: 5.0000 mg | Freq: Once | ORAL | Status: DC | PRN

## 2018-03-09 MED ORDER — VANCOMYCIN HCL IN DEXTROSE 1-5 GM/200ML-% IV SOLN
1000.0000 mg | Freq: Two times a day (BID) | INTRAVENOUS | Status: AC
  Administered 2018-03-09: 1000 mg via INTRAVENOUS
  Filled 2018-03-09: qty 200

## 2018-03-09 MED ORDER — LIDOCAINE HCL (CARDIAC) PF 100 MG/5ML IV SOSY
PREFILLED_SYRINGE | INTRAVENOUS | Status: DC | PRN
  Administered 2018-03-09: 30 mg via INTRAVENOUS

## 2018-03-09 MED ORDER — METHOCARBAMOL 500 MG IVPB - SIMPLE MED
500.0000 mg | Freq: Four times a day (QID) | INTRAVENOUS | Status: DC | PRN
  Filled 2018-03-09: qty 50

## 2018-03-09 MED ORDER — DEXAMETHASONE SODIUM PHOSPHATE 10 MG/ML IJ SOLN
10.0000 mg | Freq: Once | INTRAMUSCULAR | Status: AC
  Administered 2018-03-10: 10 mg via INTRAVENOUS
  Filled 2018-03-09: qty 1

## 2018-03-09 MED ORDER — LACTATED RINGERS IV SOLN
INTRAVENOUS | Status: DC
  Administered 2018-03-09: 10:00:00 via INTRAVENOUS

## 2018-03-09 MED ORDER — CYCLOSPORINE 0.05 % OP EMUL
1.0000 [drp] | Freq: Two times a day (BID) | OPHTHALMIC | Status: DC
  Administered 2018-03-09 – 2018-03-10 (×2): 1 [drp] via OPHTHALMIC
  Filled 2018-03-09 (×2): qty 1

## 2018-03-09 MED ORDER — FENTANYL CITRATE (PF) 100 MCG/2ML IJ SOLN
INTRAMUSCULAR | Status: AC
  Filled 2018-03-09: qty 2

## 2018-03-09 MED ORDER — FENTANYL CITRATE (PF) 100 MCG/2ML IJ SOLN: 25.0000 ug | INTRAMUSCULAR | Status: DC | PRN

## 2018-03-09 MED ORDER — ONDANSETRON HCL 4 MG PO TABS: 4.0000 mg | ORAL_TABLET | Freq: Four times a day (QID) | ORAL | Status: DC | PRN

## 2018-03-09 MED ORDER — DEXAMETHASONE SODIUM PHOSPHATE 10 MG/ML IJ SOLN
8.0000 mg | Freq: Once | INTRAMUSCULAR | Status: AC
  Administered 2018-03-09: 8 mg via INTRAVENOUS

## 2018-03-09 MED ORDER — LIDOCAINE 2% (20 MG/ML) 5 ML SYRINGE
INTRAMUSCULAR | Status: AC
  Filled 2018-03-09: qty 5

## 2018-03-09 MED ORDER — METOCLOPRAMIDE HCL 5 MG PO TABS: 5.0000 mg | ORAL_TABLET | Freq: Three times a day (TID) | ORAL | Status: DC | PRN

## 2018-03-09 MED ORDER — BUPIVACAINE LIPOSOME 1.3 % IJ SUSP
20.0000 mL | Freq: Once | INTRAMUSCULAR | Status: DC
  Filled 2018-03-09: qty 20

## 2018-03-09 MED ORDER — ONDANSETRON HCL 4 MG/2ML IJ SOLN
INTRAMUSCULAR | Status: AC
  Filled 2018-03-09: qty 2

## 2018-03-09 MED ORDER — POTASSIUM CHLORIDE CRYS ER 20 MEQ PO TBCR
20.0000 meq | EXTENDED_RELEASE_TABLET | Freq: Two times a day (BID) | ORAL | Status: DC
  Administered 2018-03-09: 20 meq via ORAL
  Filled 2018-03-09: qty 1

## 2018-03-09 MED ORDER — POLYETHYLENE GLYCOL 3350 17 G PO PACK: 17.0000 g | PACK | Freq: Every day | ORAL | Status: DC | PRN

## 2018-03-09 MED ORDER — TRIAMTERENE-HCTZ 75-50 MG PO TABS
0.5000 | ORAL_TABLET | Freq: Every day | ORAL | Status: DC
  Administered 2018-03-09 – 2018-03-10 (×2): 0.5 via ORAL
  Filled 2018-03-09 (×2): qty 0.5

## 2018-03-09 MED ORDER — PROPRANOLOL HCL 20 MG PO TABS
20.0000 mg | ORAL_TABLET | Freq: Two times a day (BID) | ORAL | Status: DC
  Administered 2018-03-09 – 2018-03-10 (×2): 20 mg via ORAL
  Filled 2018-03-09 (×2): qty 1

## 2018-03-09 MED ORDER — DIPHENHYDRAMINE HCL 50 MG/ML IJ SOLN
INTRAMUSCULAR | Status: AC
  Filled 2018-03-09: qty 1

## 2018-03-09 MED ORDER — DIPHENHYDRAMINE HCL 12.5 MG/5ML PO ELIX: 12.5000 mg | ORAL_SOLUTION | ORAL | Status: DC | PRN

## 2018-03-09 MED ORDER — HYDROMORPHONE HCL 2 MG PO TABS: 4.0000 mg | ORAL_TABLET | ORAL | Status: DC | PRN

## 2018-03-09 MED ORDER — BISACODYL 10 MG RE SUPP: 10.0000 mg | Freq: Every day | RECTAL | Status: DC | PRN

## 2018-03-09 MED ORDER — BUPIVACAINE IN DEXTROSE 0.75-8.25 % IT SOLN
INTRATHECAL | Status: DC | PRN
  Administered 2018-03-09: 13 mg via INTRATHECAL

## 2018-03-09 MED ORDER — ACETAMINOPHEN 500 MG PO TABS
1000.0000 mg | ORAL_TABLET | Freq: Four times a day (QID) | ORAL | Status: AC
  Administered 2018-03-09 – 2018-03-10 (×4): 1000 mg via ORAL
  Filled 2018-03-09 (×4): qty 2

## 2018-03-09 MED ORDER — HYDROMORPHONE HCL 2 MG PO TABS
2.0000 mg | ORAL_TABLET | ORAL | Status: DC | PRN
  Administered 2018-03-09 – 2018-03-10 (×4): 2 mg via ORAL
  Filled 2018-03-09 (×4): qty 1

## 2018-03-09 MED ORDER — ACETAMINOPHEN 10 MG/ML IV SOLN: 1000.0000 mg | Freq: Once | INTRAVENOUS | Status: DC | PRN

## 2018-03-09 MED ORDER — MIDAZOLAM HCL 5 MG/5ML IJ SOLN
INTRAMUSCULAR | Status: DC | PRN
  Administered 2018-03-09: 1 mg via INTRAVENOUS

## 2018-03-09 MED ORDER — SODIUM CHLORIDE 0.9 % IR SOLN
Status: DC | PRN
  Administered 2018-03-09: 1000 mL

## 2018-03-09 MED ORDER — MIDAZOLAM HCL 2 MG/2ML IJ SOLN
INTRAMUSCULAR | Status: AC
  Filled 2018-03-09: qty 2

## 2018-03-09 MED ORDER — ONDANSETRON HCL 4 MG/2ML IJ SOLN
INTRAMUSCULAR | Status: DC | PRN
  Administered 2018-03-09: 4 mg via INTRAVENOUS

## 2018-03-09 MED ORDER — FENTANYL CITRATE (PF) 100 MCG/2ML IJ SOLN
50.0000 ug | INTRAMUSCULAR | Status: DC
  Administered 2018-03-09: 25 ug via INTRAVENOUS
  Administered 2018-03-09: 50 ug via INTRAVENOUS
  Filled 2018-03-09: qty 2

## 2018-03-09 MED ORDER — OXYCODONE HCL 5 MG PO TABS: 5.0000 mg | ORAL_TABLET | Freq: Once | ORAL | Status: DC | PRN

## 2018-03-09 MED ORDER — PROPOFOL 10 MG/ML IV BOLUS
INTRAVENOUS | Status: DC | PRN
  Administered 2018-03-09: 30 mg via INTRAVENOUS

## 2018-03-09 MED ORDER — SODIUM CHLORIDE (PF) 0.9 % IJ SOLN
INTRAMUSCULAR | Status: DC | PRN
  Administered 2018-03-09: 60 mL

## 2018-03-09 MED ORDER — ACETAMINOPHEN 500 MG PO TABS: 1000.0000 mg | ORAL_TABLET | Freq: Once | ORAL | Status: DC | PRN

## 2018-03-09 MED ORDER — ONDANSETRON HCL 4 MG/2ML IJ SOLN: 4.0000 mg | Freq: Four times a day (QID) | INTRAMUSCULAR | Status: DC | PRN

## 2018-03-09 MED ORDER — RIVAROXABAN 10 MG PO TABS
10.0000 mg | ORAL_TABLET | Freq: Every day | ORAL | Status: DC
  Administered 2018-03-10: 10 mg via ORAL
  Filled 2018-03-09: qty 1

## 2018-03-09 MED ORDER — ACETAMINOPHEN 10 MG/ML IV SOLN
1000.0000 mg | Freq: Once | INTRAVENOUS | Status: AC
  Administered 2018-03-09: 1000 mg via INTRAVENOUS
  Filled 2018-03-09: qty 100

## 2018-03-09 MED ORDER — PROPOFOL 10 MG/ML IV BOLUS
INTRAVENOUS | Status: AC
  Filled 2018-03-09: qty 40

## 2018-03-09 MED ORDER — BUPIVACAINE LIPOSOME 1.3 % IJ SUSP
INTRAMUSCULAR | Status: DC | PRN
  Administered 2018-03-09: 20 mL

## 2018-03-09 MED ORDER — TRANEXAMIC ACID 1000 MG/10ML IV SOLN
INTRAVENOUS | Status: DC | PRN
  Administered 2018-03-09: 2000 mg via TOPICAL

## 2018-03-09 MED ORDER — LOSARTAN POTASSIUM 50 MG PO TABS
50.0000 mg | ORAL_TABLET | Freq: Every day | ORAL | Status: DC
  Administered 2018-03-10: 50 mg via ORAL
  Filled 2018-03-09: qty 1

## 2018-03-09 MED ORDER — DOCUSATE SODIUM 100 MG PO CAPS
100.0000 mg | ORAL_CAPSULE | Freq: Two times a day (BID) | ORAL | Status: DC
  Administered 2018-03-09 – 2018-03-10 (×2): 100 mg via ORAL
  Filled 2018-03-09 (×2): qty 1

## 2018-03-09 MED ORDER — CHLORHEXIDINE GLUCONATE 4 % EX LIQD: 60.0000 mL | Freq: Once | CUTANEOUS | Status: DC

## 2018-03-09 MED ORDER — VENLAFAXINE HCL 37.5 MG PO TABS
37.5000 mg | ORAL_TABLET | Freq: Two times a day (BID) | ORAL | Status: DC
  Administered 2018-03-09 – 2018-03-10 (×2): 37.5 mg via ORAL
  Filled 2018-03-09 (×2): qty 1

## 2018-03-09 MED ORDER — VERAPAMIL HCL ER 180 MG PO TBCR
180.0000 mg | EXTENDED_RELEASE_TABLET | Freq: Every day | ORAL | Status: DC
  Administered 2018-03-10: 180 mg via ORAL
  Filled 2018-03-09 (×2): qty 1

## 2018-03-09 MED ORDER — DIPHENHYDRAMINE HCL 50 MG/ML IJ SOLN
INTRAMUSCULAR | Status: DC | PRN
  Administered 2018-03-09: 12.5 mg via INTRAVENOUS

## 2018-03-09 MED ORDER — PROPOFOL 500 MG/50ML IV EMUL
INTRAVENOUS | Status: DC | PRN
  Administered 2018-03-09: 25 ug/kg/min via INTRAVENOUS

## 2018-03-09 MED ORDER — 0.9 % SODIUM CHLORIDE (POUR BTL) OPTIME
TOPICAL | Status: DC | PRN
  Administered 2018-03-09: 1000 mL

## 2018-03-09 MED ORDER — DEXAMETHASONE SODIUM PHOSPHATE 10 MG/ML IJ SOLN
INTRAMUSCULAR | Status: AC
  Filled 2018-03-09: qty 1

## 2018-03-09 MED ORDER — SODIUM CHLORIDE (PF) 0.9 % IJ SOLN
INTRAMUSCULAR | Status: AC
  Filled 2018-03-09: qty 10

## 2018-03-09 MED ORDER — HYDROMORPHONE HCL 1 MG/ML IJ SOLN: 0.5000 mg | INTRAMUSCULAR | Status: DC | PRN

## 2018-03-09 MED ORDER — ACETAMINOPHEN 160 MG/5ML PO SOLN: 1000.0000 mg | Freq: Once | ORAL | Status: DC | PRN

## 2018-03-09 SURGICAL SUPPLY — 60 items
BAG DECANTER FOR FLEXI CONT (MISCELLANEOUS) ×2 IMPLANT
BAG SPEC THK2 15X12 ZIP CLS (MISCELLANEOUS) ×1
BAG ZIPLOCK 12X15 (MISCELLANEOUS) ×3 IMPLANT
BANDAGE ACE 6X5 VEL STRL LF (GAUZE/BANDAGES/DRESSINGS) ×3 IMPLANT
BLADE SAG 18X100X1.27 (BLADE) ×1 IMPLANT
BLADE SAW SGTL 11.0X1.19X90.0M (BLADE) ×3 IMPLANT
BOWL SMART MIX CTS (DISPOSABLE) ×3 IMPLANT
CEMENT HV SMART SET (Cement) ×6 IMPLANT
CEMENT TIBIA MBT SIZE 2.5 (Knees) IMPLANT
CLOSURE WOUND 1/2 X4 (GAUZE/BANDAGES/DRESSINGS) ×2
COVER SURGICAL LIGHT HANDLE (MISCELLANEOUS) ×3 IMPLANT
COVER WAND RF STERILE (DRAPES) ×2 IMPLANT
CUFF TOURN SGL QUICK 34 (TOURNIQUET CUFF) ×3
CUFF TRNQT CYL 34X4X40X1 (TOURNIQUET CUFF) ×1 IMPLANT
DECANTER SPIKE VIAL GLASS SM (MISCELLANEOUS) ×3 IMPLANT
DRAPE U-SHAPE 47X51 STRL (DRAPES) ×3 IMPLANT
DRSG ADAPTIC 3X8 NADH LF (GAUZE/BANDAGES/DRESSINGS) ×3 IMPLANT
DRSG PAD ABDOMINAL 8X10 ST (GAUZE/BANDAGES/DRESSINGS) ×1 IMPLANT
DURAPREP 26ML APPLICATOR (WOUND CARE) ×3 IMPLANT
ELECT REM PT RETURN 15FT ADLT (MISCELLANEOUS) ×3 IMPLANT
EVACUATOR 1/8 PVC DRAIN (DRAIN) ×3 IMPLANT
FEMUR SIGMA PS SZ 2.5 L (Knees) ×2 IMPLANT
GAUZE SPONGE 4X4 12PLY STRL (GAUZE/BANDAGES/DRESSINGS) ×3 IMPLANT
GLOVE BIO SURGEON STRL SZ7 (GLOVE) ×3 IMPLANT
GLOVE BIO SURGEON STRL SZ8 (GLOVE) ×3 IMPLANT
GLOVE BIOGEL PI IND STRL 6.5 (GLOVE) ×1 IMPLANT
GLOVE BIOGEL PI IND STRL 7.0 (GLOVE) ×1 IMPLANT
GLOVE BIOGEL PI IND STRL 8 (GLOVE) ×1 IMPLANT
GLOVE BIOGEL PI INDICATOR 6.5 (GLOVE)
GLOVE BIOGEL PI INDICATOR 7.0 (GLOVE) ×2
GLOVE BIOGEL PI INDICATOR 8 (GLOVE) ×2
GLOVE SURG SS PI 6.5 STRL IVOR (GLOVE) ×1 IMPLANT
GOWN STRL REUS W/TWL LRG LVL3 (GOWN DISPOSABLE) ×6 IMPLANT
GOWN STRL REUS W/TWL XL LVL3 (GOWN DISPOSABLE) ×5 IMPLANT
HANDPIECE INTERPULSE COAX TIP (DISPOSABLE) ×3
HOLDER FOLEY CATH W/STRAP (MISCELLANEOUS) ×2 IMPLANT
IMMOBILIZER KNEE 20 (SOFTGOODS) ×2 IMPLANT
IMMOBILIZER KNEE 20 THIGH 36 (SOFTGOODS) ×1 IMPLANT
INSERT PFC SIG STB SZ 2.5 15.5 (Knees) ×2 IMPLANT
MANIFOLD NEPTUNE II (INSTRUMENTS) ×3 IMPLANT
NS IRRIG 1000ML POUR BTL (IV SOLUTION) ×1 IMPLANT
PACK TOTAL KNEE CUSTOM (KITS) ×3 IMPLANT
PAD ABD 8X10 STRL (GAUZE/BANDAGES/DRESSINGS) ×2 IMPLANT
PADDING CAST COTTON 6X4 STRL (CAST SUPPLIES) ×9 IMPLANT
PATELLA DOME PFC 35MM (Knees) ×2 IMPLANT
PIN STEINMAN FIXATION KNEE (PIN) ×2 IMPLANT
PROTECTOR NERVE ULNAR (MISCELLANEOUS) ×3 IMPLANT
SET HNDPC FAN SPRY TIP SCT (DISPOSABLE) ×1 IMPLANT
STRIP CLOSURE SKIN 1/2X4 (GAUZE/BANDAGES/DRESSINGS) ×4 IMPLANT
SUT MNCRL AB 4-0 PS2 18 (SUTURE) ×3 IMPLANT
SUT STRATAFIX 0 PDS 27 VIOLET (SUTURE) ×3
SUT VIC AB 2-0 CT1 27 (SUTURE) ×9
SUT VIC AB 2-0 CT1 TAPERPNT 27 (SUTURE) ×3 IMPLANT
SUTURE STRATFX 0 PDS 27 VIOLET (SUTURE) ×1 IMPLANT
TIBIA MBT CEMENT SIZE 2.5 (Knees) ×3 IMPLANT
TRAY FOLEY CATH 14FR (SET/KITS/TRAYS/PACK) ×2 IMPLANT
TRAY FOLEY MTR SLVR 16FR STAT (SET/KITS/TRAYS/PACK) ×1 IMPLANT
WATER STERILE IRR 1000ML POUR (IV SOLUTION) ×6 IMPLANT
WRAP KNEE MAXI GEL POST OP (GAUZE/BANDAGES/DRESSINGS) ×3 IMPLANT
YANKAUER SUCT BULB TIP 10FT TU (MISCELLANEOUS) ×3 IMPLANT

## 2018-03-09 NOTE — Op Note (Signed)
OPERATIVE REPORT-TOTAL KNEE ARTHROPLASTY   Pre-operative diagnosis- Osteoarthritis  Left knee(s)  Post-operative diagnosis- Osteoarthritis Left knee(s)  Procedure-  Left  Total Knee Arthroplasty  Surgeon- Dione Plover. Shaelee Forni, MD  Assistant- Theresa Duty, PA-C   Anesthesia-  Adductor canal block and spinal  EBL-25 mL   Drains Hemovac  Tourniquet time-  Total Tourniquet Time Documented: Thigh (Left) - 37 minutes Total: Thigh (Left) - 37 minutes     Complications- None  Condition-PACU - hemodynamically stable.   Brief Clinical Note  Teresa Chapman is a 77 y.o. year old female with end stage OA of her left knee with progressively worsening pain and dysfunction. She has constant pain, with activity and at rest and significant functional deficits with difficulties even with ADLs. She has had extensive non-op management including analgesics, injections of cortisone and viscosupplements, and home exercise program, but remains in significant pain with significant dysfunction. Radiographs show bone on bone arthritis lateral and patellofemoral. She presents now for left Total Knee Arthroplasty.    Procedure in detail---   The patient is brought into the operating room and positioned supine on the operating table. After successful administration of  Adductor canal block and spinal,   a tourniquet is placed high on the  Left thigh(s) and the lower extremity is prepped and draped in the usual sterile fashion. Time out is performed by the operating team and then the  Left lower extremity is wrapped in Esmarch, knee flexed and the tourniquet inflated to 300 mmHg.       A midline incision is made with a ten blade through the subcutaneous tissue to the level of the extensor mechanism. A fresh blade is used to make a medial parapatellar arthrotomy. Soft tissue over the proximal medial tibia is subperiosteally elevated to the joint line with a knife and into the semimembranosus bursa with a Cobb  elevator. Soft tissue over the proximal lateral tibia is elevated with attention being paid to avoiding the patellar tendon on the tibial tubercle. The patella is everted, knee flexed 90 degrees and the ACL and PCL are removed. Findings are bone on bone lateral and patellofemoral with large global osteophytes.        The drill is used to create a starting hole in the distal femur and the canal is thoroughly irrigated with sterile saline to remove the fatty contents. The 5 degree Left  valgus alignment guide is placed into the femoral canal and the distal femoral cutting block is pinned to remove 10 mm off the distal femur. Resection is made with an oscillating saw.      The tibia is subluxed forward and the menisci are removed. The extramedullary alignment guide is placed referencing proximally at the medial aspect of the tibial tubercle and distally along the second metatarsal axis and tibial crest. The block is pinned to remove 65mm off the more deficient lateral  side. Resection is made with an oscillating saw. Size 2.5is the most appropriate size for the tibia and the proximal tibia is prepared with the modular drill and keel punch for that size.      The femoral sizing guide is placed and size 2.5 is most appropriate. Rotation is marked off the epicondylar axis and confirmed by creating a rectangular flexion gap at 90 degrees. The size 2.5 cutting block is pinned in this rotation and the anterior, posterior and chamfer cuts are made with the oscillating saw. The intercondylar block is then placed and that cut is made.  Trial size 2.5 tibial component, trial size 2.5 posterior stabilized femur and a 15  mm posterior stabilized rotating platform insert trial is placed. Full extension is achieved with excellent varus/valgus and anterior/posterior balance throughout full range of motion. The patella is everted and thickness measured to be 22  mm. Free hand resection is taken to 12 mm, a 35 template is  placed, lug holes are drilled, trial patella is placed, and it tracks normally. Osteophytes are removed off the posterior femur with the trial in place. All trials are removed and the cut bone surfaces prepared with pulsatile lavage. Cement is mixed and once ready for implantation, the size 2.5 tibial implant, size  2.5 posterior stabilized femoral component, and the size 35 patella are cemented in place and the patella is held with the clamp. The trial insert is placed and the knee held in full extension. The Exparel (20 ml mixed with 60 ml saline) is injected into the extensor mechanism, posterior capsule, medial and lateral gutters and subcutaneous tissues.  All extruded cement is removed and once the cement is hard the permanent 15 mm posterior stabilized rotating platform insert is placed into the tibial tray.      The wound is copiously irrigated with saline solution and the extensor mechanism closed over a hemovac drain with #1 V-loc suture. The tourniquet is released for a total tourniquet time of 37  minutes. Flexion against gravity is 140 degrees and the patella tracks normally. Subcutaneous tissue is closed with 2.0 vicryl and subcuticular with running 4.0 Monocryl. The incision is cleaned and dried and steri-strips and a bulky sterile dressing are applied. The limb is placed into a knee immobilizer and the patient is awakened and transported to recovery in stable condition.      Please note that a surgical assistant was a medical necessity for this procedure in order to perform it in a safe and expeditious manner. Surgical assistant was necessary to retract the ligaments and vital neurovascular structures to prevent injury to them and also necessary for proper positioning of the limb to allow for anatomic placement of the prosthesis.   Dione Plover Aylana Hirschfeld, MD    03/09/2018, 11:58 AM

## 2018-03-09 NOTE — Anesthesia Procedure Notes (Signed)
Spinal

## 2018-03-09 NOTE — Interval H&P Note (Signed)
History and Physical Interval Note:  03/09/2018 9:37 AM  Teresa Chapman  has presented today for surgery, with the diagnosis of left knee osteoarthritis  The various methods of treatment have been discussed with the patient and family. After consideration of risks, benefits and other options for treatment, the patient has consented to  Procedure(s) with comments: LEFT TOTAL KNEE ARTHROPLASTY (Left) - 2min as a surgical intervention .  The patient's history has been reviewed, patient examined, no change in status, stable for surgery.  I have reviewed the patient's chart and labs.  Questions were answered to the patient's satisfaction.     Pilar Plate Angelica Frandsen

## 2018-03-09 NOTE — Evaluation (Signed)
Physical Therapy Evaluation Patient Details Name: Teresa Chapman MRN: [id number] DOB: 06-20-1940 Today's Date: 03/09/2018   History of Present Illness  L TKA  Clinical Impression  Pt is s/p TKA resulting in the deficits listed below (see PT Problem List). Pt ambulated 25' with RW, distance limited by pain. Initiated TKA HEP. Good progress expected.  Pt will benefit from skilled PT to increase their independence and safety with mobility to allow discharge to the venue listed below.      Follow Up Recommendations Outpatient PT;Follow surgeon's recommendation for DC plan and follow-up therapies(outpt PT scheduled for Fri 03/13/18)    Equipment Recommendations  None recommended by PT    Recommendations for Other Services       Precautions / Restrictions Precautions Precautions: Knee Precaution Comments: reviewed no pillow under knee; pt denies falls in past 1 year Restrictions Weight Bearing Restrictions: No Other Position/Activity Restrictions: WBAT      Mobility  Bed Mobility Overal bed mobility: Needs Assistance Bed Mobility: Supine to Sit     Supine to sit: Min assist     General bed mobility comments: min A for LLE  Transfers Overall transfer level: Needs assistance Equipment used: Rolling walker (2 wheeled) Transfers: Sit to/from Stand Sit to Stand: Min assist         General transfer comment: min A to rise, VCs hand placement  Ambulation/Gait Ambulation/Gait assistance: Min guard Gait Distance (Feet): 25 Feet Assistive device: Rolling walker (2 wheeled) Gait Pattern/deviations: Step-to pattern;Decreased step length - right;Decreased step length - left Gait velocity: decr   General Gait Details: VCs sequencing, no loss of balance, distance limited by pain  Stairs            Wheelchair Mobility    Modified Rankin (Stroke Patients Only)       Balance Overall balance assessment: Modified Independent                                           Pertinent Vitals/Pain Pain Assessment: 0-10 Pain Score: 6  Pain Location: L knee Pain Descriptors / Indicators: Sore Pain Intervention(s): Limited activity within patient's tolerance;Monitored during session;Patient requesting pain meds-RN notified;Ice applied    Home Living Family/patient expects to be discharged to:: Private residence Living Arrangements: Spouse/significant other Available Help at Discharge: Family;Available 24 hours/day Type of Home: House Home Access: Stairs to enter   [employer] of Steps: 2 Home Layout: Two level;Able to live on main level with bedroom/bathroom Home Equipment: Kasandra Knudsen - single point;Walker - 2 wheels;Shower seat      Prior Function Level of Independence: Independent with assistive device(s)         Comments: used SPC prn     Hand Dominance        Extremity/Trunk Assessment   Upper Extremity Assessment Upper Extremity Assessment: Overall WFL for tasks assessed    Lower Extremity Assessment Lower Extremity Assessment: LLE deficits/detail LLE Deficits / Details: SLR 3/5, knee 5-40* AAROM    Cervical / Trunk Assessment Cervical / Trunk Assessment: Normal  Communication   Communication: No difficulties  Cognition Arousal/Alertness: Awake/alert Behavior During Therapy: WFL for tasks assessed/performed Overall Cognitive Status: Within Functional Limits for tasks assessed  General Comments      Exercises Total Joint Exercises Ankle Circles/Pumps: AROM;Both;10 reps;Supine Quad Sets: AROM;Left;5 reps;Supine Heel Slides: AAROM;Left;10 reps;Supine Goniometric ROM: 5-40* AAROM L knee   Assessment/Plan    PT Assessment Patient needs continued PT services  PT Problem List Decreased range of motion;Decreased strength;Decreased activity tolerance;Decreased mobility;Pain;Decreased knowledge of use of DME       PT Treatment Interventions DME  instruction;Gait training;Stair training;Therapeutic exercise;Functional mobility training;Patient/family education    PT Goals (Current goals can be found in the Care Plan section)  Acute Rehab PT Goals Patient Stated Goal: working out with trainer, go to church PT Goal Formulation: With patient Time For Goal Achievement: 03/16/18 Potential to Achieve Goals: Good    Frequency 7X/week   Barriers to discharge        Co-evaluation               AM-PAC PT "6 Clicks" Daily Activity  Outcome Measure Difficulty turning over in bed (including adjusting bedclothes, sheets and blankets)?: A Little Difficulty moving from lying on back to sitting on the side of the bed? : Unable Difficulty sitting down on and standing up from a chair with arms (e.g., wheelchair, bedside commode, etc,.)?: Unable Help needed moving to and from a bed to chair (including a wheelchair)?: A Little Help needed walking in hospital room?: A Little Help needed climbing 3-5 steps with a railing? : A Lot 6 Click Score: 13    End of Session Equipment Utilized During Treatment: Gait belt Activity Tolerance: Patient limited by pain;Patient tolerated treatment well Patient left: in chair;with call bell/phone within reach Nurse Communication: Mobility status PT Visit Diagnosis: Muscle weakness (generalized) (M62.81);Difficulty in walking, not elsewhere classified (R26.2);Pain Pain - Right/Left: Left Pain - part of body: Knee    Time: 8182-9937 PT Time Calculation (min) (ACUTE ONLY): 32 min   Charges:   PT Evaluation $PT Eval Low Complexity: 1 Low PT Treatments $Gait Training: 8-22 mins       Blondell Reveal Kistler PT 03/09/2018  Acute Rehabilitation Services Pager [phone] Office [phone]

## 2018-03-09 NOTE — Anesthesia Preprocedure Evaluation (Signed)
Anesthesia Evaluation  Patient identified by MRN, date of birth, ID band Patient awake    Reviewed: Allergy & Precautions, NPO status , Patient's Chart, lab work & pertinent test results  History of Anesthesia Complications (+) PONV and history of anesthetic complications  Airway Mallampati: II  TM Distance: >3 FB Neck ROM: Full    Dental  (+) Teeth Intact   Pulmonary neg pulmonary ROS,    breath sounds clear to auscultation       Cardiovascular hypertension, Pt. on medications and Pt. on home beta blockers (-) angina(-) Past MI and (-) CHF  Rhythm:Regular     Neuro/Psych PSYCHIATRIC DISORDERS Anxiety negative neurological ROS     GI/Hepatic negative GI ROS, (+) Hepatitis -, B  Endo/Other  negative endocrine ROS  Renal/GU CRFRenal disease     Musculoskeletal  (+) Arthritis ,   Abdominal   Peds  Hematology negative hematology ROS (+)   Anesthesia Other Findings   Reproductive/Obstetrics                             Anesthesia Physical Anesthesia Plan  ASA: III  Anesthesia Plan: MAC, Regional and Spinal   Post-op Pain Management:    Induction: Intravenous  PONV Risk Score and Plan: 3 and Treatment may vary due to age or medical condition  Airway Management Planned: Nasal Cannula  Additional Equipment: None  Intra-op Plan:   Post-operative Plan:   Informed Consent: I have reviewed the patients History and Physical, chart, labs and discussed the procedure including the risks, benefits and alternatives for the proposed anesthesia with the patient or authorized representative who has indicated his/her understanding and acceptance.   Dental advisory given  Plan Discussed with: CRNA and Surgeon  Anesthesia Plan Comments:         Anesthesia Quick Evaluation

## 2018-03-09 NOTE — Transfer of Care (Signed)
Immediate Anesthesia Transfer of Care Note  Patient: Teresa Chapman  Procedure(s) Performed: LEFT TOTAL KNEE ARTHROPLASTY-ADDUCTOR BLOCK (Left Knee)  Patient Location: PACU  Anesthesia Type:Spinal  Level of Consciousness: awake, alert  and oriented  Airway & Oxygen Therapy: Patient Spontanous Breathing and Patient connected to face mask oxygen  Post-op Assessment: Report given to RN and Post -op Vital signs reviewed and stable  Post vital signs: Reviewed and stable  Last Vitals:  Vitals Value Taken Time  BP 127/66 03/09/2018 12:27 PM  Temp    Pulse 59 03/09/2018 12:29 PM  Resp 11 03/09/2018 12:29 PM  SpO2 100 % 03/09/2018 12:29 PM  Vitals shown include unvalidated device data.  Last Pain:  Vitals:   03/09/18 1032  TempSrc:   PainSc: 0-No pain      Patients Stated Pain Goal: 4 (50/93/26 7124)  Complications: No apparent anesthesia complications

## 2018-03-09 NOTE — Discharge Instructions (Addendum)
° °Dr. Frank Aluisio °Total Joint Specialist °[facility] °[address] °[place], [place] [address] °[phone] ° °TOTAL KNEE REPLACEMENT POSTOPERATIVE DIRECTIONS ° °Knee Rehabilitation, Guidelines Following Surgery  °Results after knee surgery are often greatly improved when you follow the exercise, range of motion and muscle strengthening exercises prescribed by your doctor. Safety measures are also important to protect the knee from further injury. Any time any of these exercises cause you to have increased pain or swelling in your knee joint, decrease the amount until you are comfortable again and slowly increase them. If you have problems or questions, call your caregiver or physical therapist for advice.  ° °HOME CARE INSTRUCTIONS  °• Remove items at home which could result in a fall. This includes throw rugs or furniture in walking pathways.  °· ICE to the affected knee every three hours for 30 minutes at a time and then as needed for pain and swelling.  Continue to use ice on the knee for pain and swelling from surgery. You may notice swelling that will progress down to the foot and ankle.  This is normal after surgery.  Elevate the leg when you are not up walking on it.   °· Continue to use the breathing machine which will help keep your temperature down.  It is common for your temperature to cycle up and down following surgery, especially at night when you are not up moving around and exerting yourself.  The breathing machine keeps your lungs expanded and your temperature down. °· Do not place pillow under knee, focus on keeping the knee straight while resting ° °DIET °You may resume your previous home diet once your are discharged from the hospital. ° °DRESSING / WOUND CARE / SHOWERING °You may shower 3 days after surgery, but keep the wounds dry during showering.  You may use an occlusive plastic wrap (Press'n Seal for example), NO SOAKING/SUBMERGING IN THE BATHTUB.  If the bandage  gets wet, change with a clean dry gauze.  If the incision gets wet, pat the wound dry with a clean towel. °You may start showering once you are discharged home but do not submerge the incision under water. Just pat the incision dry and apply a dry gauze dressing on daily. °Change the surgical dressing daily and reapply a dry dressing each time. ° °ACTIVITY °Walk with your walker as instructed. °Use walker as long as suggested by your caregivers. °Avoid periods of inactivity such as sitting longer than an hour when not asleep. This helps prevent blood clots.  °You may resume a sexual relationship in one month or when given the OK by your doctor.  °You may return to work once you are cleared by your doctor.  °Do not drive a car for 6 weeks or until released by you surgeon.  °Do not drive while taking narcotics. ° °WEIGHT BEARING °Weight bearing as tolerated with assist device (walker, cane, etc) as directed, use it as long as suggested by your surgeon or therapist, typically at least 4-6 weeks. ° °POSTOPERATIVE CONSTIPATION PROTOCOL °Constipation - defined medically as fewer than three stools per week and severe constipation as less than one stool per week. ° °One of the most common issues patients have following surgery is constipation.  Even if you have a regular bowel pattern at home, your normal regimen is likely to be disrupted due to multiple reasons following surgery.  Combination of anesthesia, postoperative narcotics, change in appetite and fluid intake all can affect your bowels.    In order to avoid complications following surgery, here are some recommendations in order to help you during your recovery period. ° °Colace (docusate) - Pick up an over-the-counter form of Colace or another stool softener and take twice a day as long as you are requiring postoperative pain medications.  Take with a full glass of water daily.  If you experience loose stools or diarrhea, hold the colace until you stool forms back  up.  If your symptoms do not get better within 1 week or if they get worse, check with your doctor. ° °Dulcolax (bisacodyl) - Pick up over-the-counter and take as directed by the product packaging as needed to assist with the movement of your bowels.  Take with a full glass of water.  Use this product as needed if not relieved by Colace only.  ° °MiraLax (polyethylene glycol) - Pick up over-the-counter to have on hand.  MiraLax is a solution that will increase the amount of water in your bowels to assist with bowel movements.  Take as directed and can mix with a glass of water, juice, soda, coffee, or tea.  Take if you go more than two days without a movement. °Do not use MiraLax more than once per day. Call your doctor if you are still constipated or irregular after using this medication for 7 days in a row. ° °If you continue to have problems with postoperative constipation, please contact the office for further assistance and recommendations.  If you experience "the worst abdominal pain ever" or develop nausea or vomiting, please contact the office immediatly for further recommendations for treatment. ° °ITCHING ° If you experience itching with your medications, try taking only a single pain pill, or even half a pain pill at a time.  You can also use Benadryl over the counter for itching or also to help with sleep.  ° °TED HOSE STOCKINGS °Wear the elastic stockings on both legs for three weeks following surgery during the day but you may remove then at night for sleeping. ° °MEDICATIONS °See your medication summary on the “After Visit Summary” that the nursing staff will review with you prior to discharge.  You may have some home medications which will be placed on hold until you complete the course of blood thinner medication.  It is important for you to complete the blood thinner medication as prescribed by your surgeon.  Continue your approved medications as instructed at time of discharge. ° °PRECAUTIONS °If  you experience chest pain or shortness of breath - call 911 immediately for transfer to the hospital emergency department.  °If you develop a fever greater that 101 F, purulent drainage from wound, increased redness or drainage from wound, foul odor from the wound/dressing, or calf pain - CONTACT YOUR SURGEON.   °                                                °FOLLOW-UP APPOINTMENTS °Make sure you keep all of your appointments after your operation with your surgeon and caregivers. You should call the office at the above phone number and make an appointment for approximately two weeks after the date of your surgery or on the date instructed by your surgeon outlined in the "After Visit Summary". ° ° °RANGE OF MOTION AND STRENGTHENING EXERCISES  °Rehabilitation of the knee is important following a knee injury or   an operation. After just a few days of immobilization, the muscles of the thigh which control the knee become weakened and shrink (atrophy). Knee exercises are designed to build up the tone and strength of the thigh muscles and to improve knee motion. Often times heat used for twenty to thirty minutes before working out will loosen up your tissues and help with improving the range of motion but do not use heat for the first two weeks following surgery. These exercises can be done on a training (exercise) mat, on the floor, on a table or on a bed. Use what ever works the best and is most comfortable for you Knee exercises include:   Leg Lifts - While your knee is still immobilized in a splint or cast, you can do straight leg raises. Lift the leg to 60 degrees, hold for 3 sec, and slowly lower the leg. Repeat 10-20 times 2-3 times daily. Perform this exercise against resistance later as your knee gets better.   Quad and Hamstring Sets - Tighten up the muscle on the front of the thigh (Quad) and hold for 5-10 sec. Repeat this 10-20 times hourly. Hamstring sets are done by pushing the foot backward against an  object and holding for 5-10 sec. Repeat as with quad sets.   Leg Slides: Lying on your back, slowly slide your foot toward your buttocks, bending your knee up off the floor (only go as far as is comfortable). Then slowly slide your foot back down until your leg is flat on the floor again.  Angel Wings: Lying on your back spread your legs to the side as far apart as you can without causing discomfort.  A rehabilitation program following serious knee injuries can speed recovery and prevent re-injury in the future due to weakened muscles. Contact your doctor or a physical therapist for more information on knee rehabilitation.   IF YOU ARE TRANSFERRED TO A SKILLED REHAB FACILITY If the patient is transferred to a skilled rehab facility following release from the hospital, a list of the current medications will be sent to the facility for the patient to continue.  When discharged from the skilled rehab facility, please have the facility set up the patient's [place] prior to being released. Also, the skilled facility will be responsible for providing the patient with their medications at time of release from the facility to include their pain medication, the muscle relaxants, and their blood thinner medication. If the patient is still at the rehab facility at time of the two week follow up appointment, the skilled rehab facility will also need to assist the patient in arranging follow up appointment in our office and any transportation needs.  MAKE SURE YOU:   Understand these instructions.   Get help right away if you are not doing well or get worse.    Pick up stool softner and laxative for home use following surgery while on pain medications. Do not submerge incision under water. Please use good hand washing techniques while changing dressing each day. May shower starting three days after surgery. Please use a clean towel to pat the incision dry following showers. Continue to  use ice for pain and swelling after surgery. Do not use any lotions or creams on the incision until instructed by your surgeon.  Information on my medicine - XARELTO (Rivaroxaban)  This medication education was reviewed with me or my healthcare representative as part of my discharge preparation.  The pharmacist that spoke with me  during my hospital stay was:    Why was Xarelto prescribed for you? Xarelto was prescribed for you to reduce the risk of blood clots forming after orthopedic surgery. The medical term for these abnormal blood clots is venous thromboembolism (VTE).  What do you need to know about xarelto ? Take your Xarelto ONCE DAILY at the same time every day. You may take it either with or without food.  If you have difficulty swallowing the tablet whole, you may crush it and mix in applesauce just prior to taking your dose.  Take Xarelto exactly as prescribed by your doctor and DO NOT stop taking Xarelto without talking to the doctor who prescribed the medication.  Stopping without other VTE prevention medication to take the place of Xarelto may increase your risk of developing a clot.  After discharge, you should have regular check-up appointments with your healthcare provider that is prescribing your Xarelto.    What do you do if you miss a dose? If you miss a dose, take it as soon as you remember on the same day then continue your regularly scheduled once daily regimen the next day. Do not take two doses of Xarelto on the same day.   Important Safety Information A possible side effect of Xarelto is bleeding. You should call your healthcare provider right away if you experience any of the following: ? Bleeding from an injury or your nose that does not stop. ? Unusual colored urine (red or dark brown) or unusual colored stools (red or black). ? Unusual bruising for unknown reasons. ? A serious fall or if you hit your head (even if there is no bleeding).  Some  medicines may interact with Xarelto and might increase your risk of bleeding while on Xarelto. To help avoid this, consult your healthcare provider or pharmacist prior to using any new prescription or non-prescription medications, including herbals, vitamins, non-steroidal anti-inflammatory drugs (NSAIDs) and supplements.  This website has more information on Xarelto: [email or web address]

## 2018-03-09 NOTE — Anesthesia Procedure Notes (Signed)
Spinal  Start time: 03/09/2018 10:28 AM End time: 03/09/2018 10:57 AM Staffing Resident/CRNA: Garrel Ridgel, CRNA Performed: resident/CRNA  Preanesthetic Checklist Completed: patient identified, site marked, surgical consent, pre-op evaluation, timeout performed, IV checked, risks and benefits discussed and monitors and equipment checked Spinal Block Patient position: sitting Prep: Betadine Patient monitoring: heart rate, cardiac monitor, continuous pulse ox and blood pressure Approach: midline Location: L3-4 Injection technique: single-shot Needle Needle type: Spinocan  Needle gauge: 24 G Needle length: 9 cm Needle insertion depth: 5 cm Assessment Sensory level: T10

## 2018-03-09 NOTE — Care Plan (Signed)
Ortho Bundle Case Management Note  Patient Details  Name: Teresa Chapman MRN: [id number] Date of Birth: 1940/07/08  L TKA scheduled on 03-09-18 DCP:  Home with spouse.  2 story home with 2 ste. DME:  No needs.  Has a RW and elevated toilets. PT:  EO.  PT eval scheduled on 03-13-18.           DME Arranged:  N/A DME Agency:  NA  HH Arranged:  NA(OP PT scheduled at [facility] on 03-13-18) [place] Agency:  NA  Additional Comments: Please contact me with any questions of if this plan should need to change.  Marianne Sofia, RN,CCM [facility]  [phone] 03/09/2018, 9:53 AM

## 2018-03-09 NOTE — Anesthesia Procedure Notes (Signed)
Anesthesia Regional Block: Adductor canal block   Pre-Anesthetic Checklist: ,, timeout performed, Correct Patient, Correct Site, Correct Laterality, Correct Procedure, Correct Position, site marked, Risks and benefits discussed,  Surgical consent,  Pre-op evaluation,  At surgeon's request and post-op pain management  Laterality: Left and Lower  Prep: chloraprep       Needles:  Injection technique: Single-shot     Needle Length: 9cm  Needle Gauge: 22     Additional Needles: Arrow StimuQuik ECHO Echogenic Stimulating PNB Needle  Procedures:,,,, ultrasound used (permanent image in chart),,,,  Narrative:  Start time: 03/09/2018 10:24 AM End time: 03/09/2018 10:29 AM Injection made incrementally with aspirations every 5 mL.  Performed by: Personally  Anesthesiologist: Oleta Mouse, MD

## 2018-03-09 NOTE — Progress Notes (Signed)
Assisted Dr. Ermalene Postin with left, ultrasound guided, adductor canal block. Side rails up, monitors on throughout procedure. See vital signs in flow sheet. Tolerated Procedure well.

## 2018-03-10 DIAGNOSIS — I129 Hypertensive chronic kidney disease with stage 1 through stage 4 chronic kidney disease, or unspecified chronic kidney disease: Secondary | ICD-10-CM | POA: Diagnosis not present

## 2018-03-10 DIAGNOSIS — E669 Obesity, unspecified: Secondary | ICD-10-CM | POA: Diagnosis not present

## 2018-03-10 DIAGNOSIS — E785 Hyperlipidemia, unspecified: Secondary | ICD-10-CM | POA: Diagnosis not present

## 2018-03-10 DIAGNOSIS — M1712 Unilateral primary osteoarthritis, left knee: Secondary | ICD-10-CM | POA: Diagnosis present

## 2018-03-10 DIAGNOSIS — N183 Chronic kidney disease, stage 3 (moderate): Secondary | ICD-10-CM | POA: Diagnosis not present

## 2018-03-10 DIAGNOSIS — G4700 Insomnia, unspecified: Secondary | ICD-10-CM | POA: Diagnosis not present

## 2018-03-10 LAB — CBC
HCT: 34.2 % — ABNORMAL LOW (ref 36.0–46.0)
HEMOGLOBIN: 10.9 g/dL — AB (ref 12.0–15.0)
MCH: 30.1 pg (ref 26.0–34.0)
MCHC: 31.9 g/dL (ref 30.0–36.0)
MCV: 94.5 fL (ref 80.0–100.0)
PLATELETS: 216 10*3/uL (ref 150–400)
RBC: 3.62 MIL/uL — ABNORMAL LOW (ref 3.87–5.11)
RDW: 13.9 % (ref 11.5–15.5)
WBC: 11.6 10*3/uL — AB (ref 4.0–10.5)
nRBC: 0 % (ref 0.0–0.2)

## 2018-03-10 LAB — BASIC METABOLIC PANEL
Anion gap: 8 (ref 5–15)
BUN: 17 mg/dL (ref 8–23)
CHLORIDE: 103 mmol/L (ref 98–111)
CO2: 28 mmol/L (ref 22–32)
CREATININE: 0.77 mg/dL (ref 0.44–1.00)
Calcium: 8.2 mg/dL — ABNORMAL LOW (ref 8.9–10.3)
GFR calc non Af Amer: >60 mL/min (ref >60)
Glucose, Bld: 117 mg/dL — ABNORMAL HIGH (ref 70–99)
Potassium: 3.1 mmol/L — ABNORMAL LOW (ref 3.5–5.1)
SODIUM: 139 mmol/L (ref 135–145)

## 2018-03-10 MED ORDER — POTASSIUM CHLORIDE CRYS ER 20 MEQ PO TBCR
40.0000 meq | EXTENDED_RELEASE_TABLET | ORAL | Status: AC
  Administered 2018-03-10 (×2): 40 meq via ORAL
  Filled 2018-03-10 (×2): qty 2

## 2018-03-10 MED ORDER — RIVAROXABAN 10 MG PO TABS: 10.0000 mg | ORAL_TABLET | Freq: Every day | ORAL | 0 refills | Status: DC

## 2018-03-10 MED ORDER — METHOCARBAMOL 500 MG PO TABS: 500.0000 mg | ORAL_TABLET | Freq: Four times a day (QID) | ORAL | 0 refills | Status: DC | PRN

## 2018-03-10 MED ORDER — HYDROMORPHONE HCL 2 MG PO TABS: 2.0000 mg | ORAL_TABLET | Freq: Four times a day (QID) | ORAL | 0 refills | Status: DC | PRN

## 2018-03-10 NOTE — Progress Notes (Addendum)
Physical Therapy Treatment Patient Details Name: Teresa Chapman MRN: [id number] DOB: Jan 08, 1941 Today's Date: 03/10/2018    History of Present Illness L TKA    PT Comments    Pt is progressing well with mobility and is ready to DC home from PT standpoint. She ambulated 16' with RW, demonstrates understanding of HEP, and completed stair training.   Follow Up Recommendations  Outpatient PT;Follow surgeon's recommendation for DC plan and follow-up therapies(outpt PT scheduled for Fri 03/13/18)     Equipment Recommendations  Bedside commode   Recommendations for Other Services       Precautions / Restrictions Precautions Precautions: Knee Precaution Booklet Issued: Yes (comment) Precaution Comments: reviewed no pillow under knee; pt denies falls in past 1 year Restrictions Weight Bearing Restrictions: No Other Position/Activity Restrictions: WBAT    Mobility  Bed Mobility Overal bed mobility: Needs Assistance Bed Mobility: Supine to Sit     Supine to sit: Supervision;HOB elevated     General bed mobility comments: no physical assist needed  Transfers Overall transfer level: Needs assistance Equipment used: Rolling walker (2 wheeled) Transfers: Sit to/from Stand Sit to Stand: Min guard         General transfer comment:  VCs hand placement  Ambulation/Gait Ambulation/Gait assistance: Min guard Gait Distance (Feet): 80 Feet Assistive device: Rolling walker (2 wheeled) Gait Pattern/deviations: Step-to pattern;Decreased step length - right;Decreased step length - left;Antalgic Gait velocity: decr   General Gait Details: VCs sequencing, no loss of balance, distance limited by pain   Stairs Stairs: Yes Stairs assistance: Min assist Stair Management: No rails;Backwards;Step to pattern Number of Stairs: 2 General stair comments: husband present and assisted with stabilizing RW, VCs sequencing   Wheelchair Mobility    Modified Rankin (Stroke Patients Only)        Balance Overall balance assessment: Modified Independent                                          Cognition Arousal/Alertness: Awake/alert Behavior During Therapy: WFL for tasks assessed/performed Overall Cognitive Status: Within Functional Limits for tasks assessed                                        Exercises Total Joint Exercises Ankle Circles/Pumps: AROM;Both;10 reps;Supine Quad Sets: AROM;Left;5 reps;Supine Short Arc Quad: AROM;Left;10 reps;Supine Heel Slides: AAROM;Left;10 reps;Supine Straight Leg Raises: AROM;Left;10 reps;Supine Long Arc Quad: AROM;Left;10 reps;Seated Knee Flexion: AAROM;AROM;Left;10 reps;Seated Goniometric ROM: 5-55* AAROM L knee    General Comments        Pertinent Vitals/Pain Pain Score: 8  Pain Location: L knee with walking Pain Descriptors / Indicators: Sore Pain Intervention(s): Limited activity within patient's tolerance;Monitored during session;Premedicated before session;Ice applied    Home Living                      Prior Function            PT Goals (current goals can now be found in the care plan section) Acute Rehab PT Goals Patient Stated Goal: working out with trainer, go to church PT Goal Formulation: With patient Time For Goal Achievement: 03/16/18 Potential to Achieve Goals: Good Progress towards PT goals: Progressing toward goals    Frequency    7X/week      PT Plan Current  plan remains appropriate    Co-evaluation              AM-PAC PT "6 Clicks" Daily Activity  Outcome Measure  Difficulty turning over in bed (including adjusting bedclothes, sheets and blankets)?: A Little Difficulty moving from lying on back to sitting on the side of the bed? : A Little Difficulty sitting down on and standing up from a chair with arms (e.g., wheelchair, bedside commode, etc,.)?: A Little Help needed moving to and from a bed to chair (including a wheelchair)?: A  Little Help needed walking in hospital room?: A Little Help needed climbing 3-5 steps with a railing? : A Lot 6 Click Score: 17    End of Session Equipment Utilized During Treatment: Gait belt Activity Tolerance: Patient limited by pain;Patient tolerated treatment well Patient left: in chair;with call bell/phone within reach;with family/visitor present Nurse Communication: Mobility status PT Visit Diagnosis: Muscle weakness (generalized) (M62.81);Difficulty in walking, not elsewhere classified (R26.2);Pain Pain - Right/Left: Left Pain - part of body: Knee     Time: 1200-1230 PT Time Calculation (min) (ACUTE ONLY): 30 min  Charges:  $Gait Training: 8-22 mins $Therapeutic Exercise: 8-22 mins                     Blondell Reveal Kistler PT 03/10/2018  Acute Rehabilitation Services Pager [phone] Office [phone]

## 2018-03-10 NOTE — Anesthesia Postprocedure Evaluation (Signed)
Anesthesia Post Note  Patient: Shunda B Dorko  Procedure(s) Performed: LEFT TOTAL KNEE ARTHROPLASTY-ADDUCTOR BLOCK (Left Knee)     Patient location during evaluation: PACU Anesthesia Type: Regional, MAC and Spinal Level of consciousness: awake and alert Pain management: pain level controlled Vital Signs Assessment: post-procedure vital signs reviewed and stable Respiratory status: spontaneous breathing, nonlabored ventilation, respiratory function stable and patient connected to nasal cannula oxygen Cardiovascular status: stable and blood pressure returned to baseline Postop Assessment: no apparent nausea or vomiting Anesthetic complications: no    Last Vitals:  Vitals:   03/10/18 0155 03/10/18 0538  BP: 120/64 135/67  Pulse: 67 71  Resp: 16   Temp: 36.6 C 36.8 C  SpO2: 97% 99%    Last Pain:  Vitals:   03/10/18 0538  TempSrc: Oral  PainSc:                  Wanda Rideout

## 2018-03-10 NOTE — Care Management Note (Signed)
Case Management Note  Patient Details  Name: Teresa Chapman MRN: [id number] Date of Birth: 1940/05/01  Subjective/Objective:     Spoke with patient at bedside. Confirmed plan for OP PT, already arranged.   Action/Plan: Has RW but needs a 3n1. Contacted [facility] to deliver to the room. [phone]               Expected Discharge Date:  03/10/18               Expected Discharge Plan:  OP Rehab  In-House Referral:  NA  Discharge planning Services  CM Consult  Post Acute Care Choice:  Durable Medical Equipment Choice offered to:  Patient, Spouse  DME Arranged:  N/A DME Agency:  NA  HH Arranged:  NA(OP PT scheduled at [facility] on 03-13-18) [place] Agency:  NA  Status of Service:  Completed, signed off  If discussed at [facility] of Stay Meetings, dates discussed:    Additional Comments:  Guadalupe Maple, RN 03/10/2018, 12:16 PM

## 2018-03-10 NOTE — Progress Notes (Signed)
Physical Therapy Treatment Patient Details Name: Teresa Chapman MRN: [id number] DOB: August 12, 1940 Today's Date: 03/10/2018    History of Present Illness L TKA    PT Comments    Pt is progressing well with mobility, she ambulated 81' with RW, distance limited by pain. Instructed pt in TKA HEP. Pt puts forth good effort.  Will plan to do stair training this afternoon.   Follow Up Recommendations  Outpatient PT;Follow surgeon's recommendation for DC plan and follow-up therapies(outpt PT scheduled for Fri 03/13/18)     Equipment Recommendations  None recommended by PT    Recommendations for Other Services       Precautions / Restrictions Precautions Precautions: Knee Precaution Booklet Issued: Yes (comment) Precaution Comments: reviewed no pillow under knee; pt denies falls in past 1 year Restrictions Weight Bearing Restrictions: No Other Position/Activity Restrictions: WBAT    Mobility  Bed Mobility Overal bed mobility: Needs Assistance Bed Mobility: Supine to Sit     Supine to sit: Supervision;HOB elevated     General bed mobility comments: no physical assist needed  Transfers Overall transfer level: Needs assistance Equipment used: Rolling walker (2 wheeled) Transfers: Sit to/from Stand Sit to Stand: Min guard         General transfer comment:  VCs hand placement  Ambulation/Gait Ambulation/Gait assistance: Min guard Gait Distance (Feet): 60 Feet Assistive device: Rolling walker (2 wheeled) Gait Pattern/deviations: Step-to pattern;Decreased step length - right;Decreased step length - left;Antalgic Gait velocity: decr   General Gait Details: VCs sequencing, no loss of balance, distance limited by pain   Stairs             Wheelchair Mobility    Modified Rankin (Stroke Patients Only)       Balance Overall balance assessment: Modified Independent                                          Cognition Arousal/Alertness:  Awake/alert Behavior During Therapy: WFL for tasks assessed/performed Overall Cognitive Status: Within Functional Limits for tasks assessed                                        Exercises Total Joint Exercises Ankle Circles/Pumps: AROM;Both;10 reps;Supine Quad Sets: AROM;Left;5 reps;Supine Short Arc Quad: AROM;Left;10 reps;Supine Heel Slides: AAROM;Left;10 reps;Supine Straight Leg Raises: AROM;Left;10 reps;Supine Goniometric ROM: 5-45* AAROM L knee    General Comments        Pertinent Vitals/Pain Pain Score: 8  Pain Location: L knee with walking Pain Descriptors / Indicators: Sore Pain Intervention(s): Limited activity within patient's tolerance;Monitored during session;Premedicated before session;Ice applied    Home Living                      Prior Function            PT Goals (current goals can now be found in the care plan section) Acute Rehab PT Goals Patient Stated Goal: working out with trainer, go to church PT Goal Formulation: With patient Time For Goal Achievement: 03/16/18 Potential to Achieve Goals: Good Progress towards PT goals: Progressing toward goals    Frequency    7X/week      PT Plan Current plan remains appropriate    Co-evaluation  AM-PAC PT "6 Clicks" Daily Activity  Outcome Measure  Difficulty turning over in bed (including adjusting bedclothes, sheets and blankets)?: A Little Difficulty moving from lying on back to sitting on the side of the bed? : A Little Difficulty sitting down on and standing up from a chair with arms (e.g., wheelchair, bedside commode, etc,.)?: A Little Help needed moving to and from a bed to chair (including a wheelchair)?: A Little Help needed walking in hospital room?: A Little Help needed climbing 3-5 steps with a railing? : A Lot 6 Click Score: 17    End of Session Equipment Utilized During Treatment: Gait belt Activity Tolerance: Patient limited by  pain;Patient tolerated treatment well Patient left: in chair;with call bell/phone within reach;with family/visitor present Nurse Communication: Mobility status PT Visit Diagnosis: Muscle weakness (generalized) (M62.81);Difficulty in walking, not elsewhere classified (R26.2);Pain Pain - Right/Left: Left Pain - part of body: Knee     Time: 2060-1561 PT Time Calculation (min) (ACUTE ONLY): 23 min  Charges:  $Gait Training: 8-22 mins $Therapeutic Exercise: 8-22 mins                     Blondell Reveal Kistler PT 03/10/2018  Acute Rehabilitation Services Pager [phone] Office [phone]

## 2018-03-10 NOTE — Care Management CC44 (Signed)
Condition Code 44 Documentation Completed  Patient Details  Name: Oliviana B Mckeithan MRN: [id number] Date of Birth: 06-06-1940   Condition Code 44 given:  Yes Patient signature on Condition Code 44 notice:  Yes Documentation of 2 MD's agreement:  Yes Code 44 added to claim:  Yes    Guadalupe Maple, RN 03/10/2018, 1:41 PM

## 2018-03-10 NOTE — Care Management Obs Status (Signed)
[facility] NOTIFICATION   Patient Details  Name: Teresa Chapman MRN: [id number] Date of Birth: [name], 1942   Medicare Observation Status Notification Given:  Yes    Guadalupe Maple, RN 03/10/2018, 1:41 PM

## 2018-03-10 NOTE — Progress Notes (Signed)
Subjective: 1 Day Post-Op Procedure(s) (LRB): LEFT TOTAL KNEE ARTHROPLASTY-ADDUCTOR BLOCK (Left) Patient reports pain as mild.   Patient seen in rounds by Dr. Wynelle Link. Patient is well, and has had no acute complaints or problems other than pain in the left knee. Denies chest pain, SOB, or calf pain. No issues overnight. Foley catheter removed this AM. We will continue therapy today.   Objective: Vital signs in last 24 hours: Temp:  [97.4 F (36.3 C)-98.4 F (36.9 C)] 98.2 F (36.8 C) (11/19 0538) Pulse Rate:  [55-80] 71 (11/19 0538) Resp:  [11-21] 16 (11/19 0155) BP: (115-177)/(61-97) 135/67 (11/19 0538) SpO2:  [97 %-100 %] 99 % (11/19 0538) Weight:  [72.2 kg] 72.2 kg (11/18 0927)  Intake/Output from previous day:  Intake/Output Summary (Last 24 hours) at 03/10/2018 0746 Last data filed at 03/10/2018 0600 Gross per 24 hour  Intake 2565.81 ml  Output 1150 ml  Net 1415.81 ml    Labs: Recent Labs    03/10/18 0445  HGB 10.9*   Recent Labs    03/10/18 0445  WBC 11.6*  RBC 3.62*  HCT 34.2*  PLT 216   Recent Labs    03/10/18 0445  NA 139  K 3.1*  CL 103  CO2 28  BUN 17  CREATININE 0.77  GLUCOSE 117*  CALCIUM 8.2*   Exam: General - Patient is Alert and Oriented Extremity - Neurologically intact Neurovascular intact Sensation intact distally Dorsiflexion/Plantar flexion intact Dressing - dressing C/D/I Motor Function - intact, moving foot and toes well on exam.   Past Medical History:  Diagnosis Date  . Anxiety   . Asymptomatic PVCs    "occasional"  . CKD (chronic kidney disease), stage III ([place])   . Diverticulosis   . 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 History of basal cell carcinoma (BCC) excision    face ,  right lower extremity  . History of colon polyps   . History of DVT of lower extremity    RLE  yrs ago  . History of external beam radiation therapy 01-13-2013  to 02-09-2013   left  breast  . History of squamous cell carcinoma excision    abdomen  . Hyperlipidemia   . Hypertension   . Insomnia    infreqently takes Lunesta  . Lung cancer [facility]) 2009   10-16-2007  s/p  right VATS with wedge resection right upper lobe posterior section/  low grade neuroendocrine tumor and noncaseating granuloma (last Chest CT 08-14-2010  no recurrence with stable bilateral pulm. nodules)  per pt no further treatment , followed by pcp  . Malignant neoplasm of upper-inner quadrant of left breast in female, estrogen receptor positive [facility]) ONCOLOGIST-- DR Jana Hakim   dx 07/ 2014-- Invasive Ductal carcinoma, Stage IIA, Grade 1 (pT2pN0)-- 11-18-2012  s/p  left breast lumpectomy with sln dissection re-excsion left breast 12-10-2012,  completed radiation 02-09-2013, started antiestrogen therapy 02-24-2013  . Myocardial bridge    per cardiac cath 03-26-2010  LAD tortousity with intramyocardial bridge  . Osteoarthritis    ,  knees, thumbs  . Osteopenia   . Personal history of radiation therapy    completed 02-09-2013 left breast cancer  . PONV (postoperative nausea and vomiting)   . Scoliosis   . SUI (stress urinary incontinence, female)   . Wears glasses   . Wears glasses     Assessment/Plan: 1 Day Post-Op Procedure(s) (LRB): LEFT TOTAL KNEE ARTHROPLASTY-ADDUCTOR BLOCK (Left)  Principal Problem:   OA (osteoarthritis) of knee  Estimated body mass index is 30.09 kg/m as calculated from the following:   Height as of this encounter: 5\' 1"  (1.549 m).   Weight as of this encounter: 72.2 kg. Advance diet Up with therapy D/C IV fluids  Anticipated LOS equal to or greater than 2 midnights due to - Age 23 and older with one or more of the following:  - Obesity  - Expected need for hospital services (PT, OT, Nursing) required for safe  discharge  - Anticipated need for postoperative skilled nursing care or inpatient rehab  - Active co-morbidities: DVT/VTE and Chronic Kidney Disease OR   -  Unanticipated findings during/Post Surgery: None  - Patient is a high risk of re-admission due to: None    DVT Prophylaxis - Xarelto Weight bearing as tolerated. D/C O2 and pulse ox and try on room air. Hemovac pulled without difficulty, will begin therapy today.  Potassium at 3.1 this AM, patient takes chronic KCl for this.  Plan is to go Home after hospital stay. Plan for discharge this afternoon if meeting goals with therapy. Scheduled for outpatient PT at [facility]. Follow-up in the office in 2 weeks with Dr. Wynelle Link.  Theresa Duty, PA-C Orthopedic Surgery 03/10/2018, 7:46 AM

## 2018-03-11 NOTE — Discharge Summary (Signed)
Physician Discharge Summary   Patient ID: Teresa Chapman MRN: [id number] DOB/AGE: 01/02/1941 77 y.o.  Admit date: 03/09/2018 Discharge date: 03/10/2018  Primary Diagnosis: Osteoarthritis, left knee   Admission Diagnoses:  Past Medical History:  Diagnosis Date  . Anxiety   . Asymptomatic PVCs    "occasional"  . CKD (chronic kidney disease), stage III ([place])   . Diverticulosis   . 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 History of basal cell carcinoma (BCC) excision    face ,  right lower extremity  . History of colon polyps   . History of DVT of lower extremity    RLE  yrs ago  . History of external beam radiation therapy 01-13-2013  to 02-09-2013   left breast  . History of squamous cell carcinoma excision    abdomen  . Hyperlipidemia   . Hypertension   . Insomnia    infreqently takes Lunesta  . Lung cancer [facility]) 2009   10-16-2007  s/p  right VATS with wedge resection right upper lobe posterior section/  low grade neuroendocrine tumor and noncaseating granuloma (last Chest CT 08-14-2010  no recurrence with stable bilateral pulm. nodules)  per pt no further treatment , followed by pcp  . Malignant neoplasm of upper-inner quadrant of left breast in female, estrogen receptor positive [facility]) ONCOLOGIST-- DR Jana Hakim   dx 07/ 2014-- Invasive Ductal carcinoma, Stage IIA, Grade 1 (pT2pN0)-- 11-18-2012  s/p  left breast lumpectomy with sln dissection re-excsion left breast 12-10-2012,  completed radiation 02-09-2013, started antiestrogen therapy 02-24-2013  . Myocardial bridge    per cardiac cath 03-26-2010  LAD tortousity with intramyocardial bridge  . Osteoarthritis    ,  knees, thumbs  . Osteopenia   . Personal history of radiation therapy    completed 02-09-2013 left breast cancer  . PONV (postoperative nausea and vomiting)   . Scoliosis   . SUI (stress urinary incontinence, female)   . Wears glasses   . Wears glasses     Discharge Diagnoses:   Principal Problem:   OA (osteoarthritis) of knee Active Problems:   Osteoarthritis of left knee  Estimated body mass index is 30.09 kg/m as calculated from the following:   Height as of this encounter: _0  (1.549 m).   Weight as of this encounter: 72.2 kg.  Procedure:  Procedure(s) (LRB): LEFT TOTAL KNEE ARTHROPLASTY-ADDUCTOR BLOCK (Left)   Consults: None  HPI: [name] is a 77 y.o. year old female with end stage OA of her left knee with progressively worsening pain and dysfunction. She has constant pain, with activity and at rest and significant functional deficits with difficulties even with ADLs. She has had extensive non-op management including analgesics, injections of cortisone and viscosupplements, and home exercise program, but remains in significant pain with significant dysfunction. Radiographs show bone on bone arthritis lateral and patellofemoral. She presents now for left Total Knee Arthroplasty.    Laboratory Data: Admission on 03/09/2018, Discharged on 03/10/2018  Component Date Value Ref Range Status  . WBC 03/10/2018 11.6* 4.0 - 10.5 K/uL Final  . RBC 03/10/2018 3.62* 3.87 - 5.11 MIL/uL Final  . Hemoglobin 03/10/2018 10.9* 12.0 - 15.0 g/dL Final  . HCT 03/10/2018 34.2* 36.0 - 46.0 % Final  . MCV 03/10/2018 94.5  80.0 - 100.0 fL Final  . MCH 03/10/2018 30.1  26.0 - 34.0 pg Final  . MCHC 03/10/2018 31.9  30.0 - 36.0 g/dL Final  .  RDW 03/10/2018 13.9  11.5 - 15.5 % Final  . Platelets 03/10/2018 216  150 - 400 K/uL Final  . nRBC 03/10/2018 0.0  0.0 - 0.2 % Final   Performed at [facility], [place] [address] [place], [place] [address]  . Sodium 03/10/2018 139  135 - 145 mmol/L Final  . Potassium 03/10/2018 3.1* 3.5 - 5.1 mmol/L Final  . Chloride 03/10/2018 103  98 - 111 mmol/L Final  . CO2 03/10/2018 28  22 - 32 mmol/L Final  . Glucose, Bld 03/10/2018 117* 70 - 99 mg/dL Final  . BUN 03/10/2018 17  8 - 23 mg/dL Final   . Creatinine, Ser 03/10/2018 0.77  0.44 - 1.00 mg/dL Final  . Calcium 03/10/2018 8.2* 8.9 - 10.3 mg/dL Final  . GFR calc non Af Amer 03/10/2018 >60  >60 mL/min Final  . GFR calc Af Amer 03/10/2018 >60  >60 mL/min Final   Comment: (NOTE) The eGFR has been calculated using the CKD EPI equation. This calculation has not been validated in all clinical situations. eGFR's persistently <60 mL/min signify possible Chronic Kidney Disease.   Georgiann Hahn gap 03/10/2018 8  5 - 15 Final   Performed at [facility], [place] [address] [place], [place] [address]  Hospital Outpatient Visit on 03/04/2018  Component Date Value Ref Range Status  . aPTT 03/04/2018 35  24 - 36 seconds Final   Performed at [facility], [place] [address] [place], [place] [address]  . WBC 03/04/2018 6.1  4.0 - 10.5 K/uL Final  . RBC 03/04/2018 4.27  3.87 - 5.11 MIL/uL Final  . Hemoglobin 03/04/2018 13.0  12.0 - 15.0 g/dL Final  . HCT 03/04/2018 41.2  36.0 - 46.0 % Final  . MCV 03/04/2018 96.5  80.0 - 100.0 fL Final  . MCH 03/04/2018 30.4  26.0 - 34.0 pg Final  . MCHC 03/04/2018 31.6  30.0 - 36.0 g/dL Final  . RDW 03/04/2018 14.0  11.5 - 15.5 % Final  . Platelets 03/04/2018 232  150 - 400 K/uL Final  . nRBC 03/04/2018 0.0  0.0 - 0.2 % Final   Performed at [facility], [place] [address], [place], [place] [address]  . Sodium 03/04/2018 140  135 - 145 mmol/L Final  . Potassium 03/04/2018 4.3  3.5 - 5.1 mmol/L Final  . Chloride 03/04/2018 102  98 - 111 mmol/L Final  . CO2 03/04/2018 31  22 - 32 mmol/L Final  . Glucose, Bld 03/04/2018 79  70 - 99 mg/dL Final  . BUN 03/04/2018 21  8 - 23 mg/dL Final  . Creatinine, Ser 03/04/2018 1.04* 0.44 - 1.00 mg/dL Final  . Calcium 03/04/2018 9.6  8.9 - 10.3 mg/dL Final  . Total Protein 03/04/2018 7.0  6.5 - 8.1 g/dL Final  . Albumin 03/04/2018 4.2  3.5 - 5.0 g/dL Final  . AST 03/04/2018 23  15 - 41 U/L Final  . ALT 03/04/2018 19  0 - 44  U/L Final  . Alkaline Phosphatase 03/04/2018 89  38 - 126 U/L Final  . Total Bilirubin 03/04/2018 0.8  0.3 - 1.2 mg/dL Final  . GFR calc non Af Amer 03/04/2018 50* >60 mL/min Final  . GFR calc Af Amer 03/04/2018 58* >60 mL/min Final   Comment: (NOTE) The eGFR has been calculated using the CKD EPI equation. This calculation has not been validated in all clinical situations. eGFR's persistently <60 mL/min signify possible Chronic Kidney Disease.   Marland Kitchen  Anion gap 03/04/2018 7  5 - 15 Final   Performed at [facility], [place] [address] [place], [place] [address]  . Prothrombin Time 03/04/2018 13.7  11.4 - 15.2 seconds Final  . INR 03/04/2018 1.06   Final   Performed at [facility], [place] [address] [place], [place] [address]  . ABO/RH(D) 03/04/2018 O POS   Final  . Antibody Screen 03/04/2018 NEG   Final  . Sample Expiration 03/04/2018 03/12/2018   Final  . Extend sample reason 03/04/2018    Final                   Value:NO TRANSFUSIONS OR PREGNANCY IN THE PAST 3 MONTHS Performed at [facility], [place] [address] [place], [place] [address]   . ABO/RH(D) 03/04/2018    Final                   Value:O POS Performed at [facility], [place] [address] [place], [place] [address]   . MRSA, PCR 03/04/2018 NEGATIVE  NEGATIVE Final  . Staphylococcus aureus 03/04/2018 NEGATIVE  NEGATIVE Final   Comment: (NOTE) The Xpert SA Assay (FDA approved for NASAL specimens in patients 68 years of age and older), is one component of a comprehensive surveillance program. It is not intended to diagnose infection nor to guide or monitor treatment. Performed at [facility], [place] [address], [place], [place] [address]   Appointment on 02/02/2018  Component Date Value Ref Range Status  . Sodium 02/02/2018 143  135 - 145 mmol/L Final  . Potassium 02/02/2018 3.8  3.5 - 5.1 mmol/L Final  . Chloride 02/02/2018 104  98 - 111  mmol/L Final  . CO2 02/02/2018 29  22 - 32 mmol/L Final  . Glucose, Bld 02/02/2018 97  70 - 99 mg/dL Final  . BUN 02/02/2018 19  8 - 23 mg/dL Final  . Creatinine, Ser 02/02/2018 1.03* 0.44 - 1.00 mg/dL Final  . Calcium 02/02/2018 10.0  8.9 - 10.3 mg/dL Final  . Total Protein 02/02/2018 7.1  6.5 - 8.1 g/dL Final  . Albumin 02/02/2018 3.9  3.5 - 5.0 g/dL Final  . AST 02/02/2018 21  15 - 41 U/L Final  . ALT 02/02/2018 19  0 - 44 U/L Final  . Alkaline Phosphatase 02/02/2018 101  38 - 126 U/L Final  . Total Bilirubin 02/02/2018 0.4  0.3 - 1.2 mg/dL Final  . GFR calc non Af Amer 02/02/2018 50* >60 mL/min Final  . GFR calc Af Amer 02/02/2018 58* >60 mL/min Final   Comment: (NOTE) The eGFR has been calculated using the CKD EPI equation. This calculation has not been validated in all clinical situations. eGFR's persistently <60 mL/min signify possible Chronic Kidney Disease.   Georgiann Hahn gap 02/02/2018 10  5 - 15 Final   Performed at [facility], [place] [address], [place], [place] [address]  . WBC 02/02/2018 6.6  4.0 - 10.5 K/uL Final  . RBC 02/02/2018 4.13  3.87 - 5.11 MIL/uL Final  . Hemoglobin 02/02/2018 12.7  12.0 - 15.0 g/dL Final  . HCT 02/02/2018 38.4  36.0 - 46.0 % Final  . MCV 02/02/2018 93.0  80.0 - 100.0 fL Final  . MCH 02/02/2018 30.8  26.0 - 34.0 pg Final  . MCHC 02/02/2018 33.1  30.0 - 36.0 g/dL Final  . RDW 02/02/2018 14.5  11.5 - 15.5 % Final  . Platelets 02/02/2018 226  150 - 400 K/uL Final  . nRBC 02/02/2018 0.0  0.0 - 0.2 % Final  . Neutrophils Relative % 02/02/2018 57  % Final  . Neutro Abs 02/02/2018 3.8  1.7 - 7.7 K/uL Final  . Lymphocytes Relative 02/02/2018 32  % Final  . Lymphs Abs 02/02/2018 2.1  0.7 - 4.0 K/uL Final  . Monocytes Relative 02/02/2018 9  % Final  . Monocytes Absolute 02/02/2018 0.6  0.1 - 1.0 K/uL Final  . Eosinophils Relative 02/02/2018 2  % Final  . Eosinophils Absolute 02/02/2018 0.1  0.0 - 0.5 K/uL Final  . Basophils  Relative 02/02/2018 0  % Final  . Basophils Absolute 02/02/2018 0.0  0.0 - 0.1 K/uL Final  . Immature Granulocytes 02/02/2018 0  % Final  . Abs Immature Granulocytes 02/02/2018 0.01  0.00 - 0.07 K/uL Final   Performed at [facility], [place] [address], [place], [place] [address]     X-Rays:No results found.  EKG: Orders placed or performed during the hospital encounter of 03/04/18  . EKG 12-Lead  . EKG [facility] Course: Acire B Severino is a 77 y.o. who was admitted to [facility]. They were brought to the operating room on 03/09/2018 and underwent Procedure(s): LEFT TOTAL KNEE ARTHROPLASTY-ADDUCTOR BLOCK.  Patient tolerated the procedure well and was later transferred to the recovery room and then to the orthopaedic floor for postoperative care. They were given PO and IV analgesics for pain control following their surgery. They were given 24 hours of postoperative antibiotics of  Anti-infectives (From admission, onward)   Start     Dose/Rate Route Frequency Ordered Stop   03/09/18 2300  vancomycin (VANCOCIN) IVPB 1000 mg/200 mL premix     1,000 mg 200 mL/hr over 60 Minutes Intravenous Every 12 hours 03/09/18 1335 03/10/18 0000   03/09/18 0915  vancomycin (VANCOCIN) IVPB 1000 mg/200 mL premix     1,000 mg 200 mL/hr over 60 Minutes Intravenous On call to O.R. 03/09/18 0902 03/09/18 1133     and started on DVT prophylaxis in the form of Xarelto.   PT and OT were ordered for total joint protocol. Discharge planning consulted to help with postop disposition and equipment needs.  Patient had a good night on the evening of surgery. They started to get up OOB with therapy on POD #0. Pt was seen during rounds and was ready to go home pending progress with therapy. Potassium was low at 3.1, however patient takes chronic KCl for this.  Hemovac drain was pulled without difficulty. She worked with therapy on POD #1 and was meeting her goals. Pt was discharged to  home later that day in stable condition.  Diet: Cardiac diet Activity: WBAT Follow-up: in 2 weeks with Dr. Wynelle Link Disposition: Home with outpatient physical therapy at [facility] Discharged Condition: stable   Discharge Instructions    Call MD / Call 911   Complete by:  As directed    If you experience chest pain or shortness of breath, CALL 911 and be transported to the hospital emergency room.  If you develope a fever above 101 F, pus (white drainage) or increased drainage or redness at the wound, or calf pain, call your surgeon's office.   Change dressing   Complete by:  As directed    Change dressing on Wednesday, then change the dressing daily with sterile 4 x 4 inch gauze dressing and apply TED hose.   Constipation Prevention   Complete  by:  As directed    Drink plenty of fluids.  Prune juice may be helpful.  You may use a stool softener, such as Colace (over the counter) 100 mg twice a day.  Use MiraLax (over the counter) for constipation as needed.   Diet - low sodium heart healthy   Complete by:  As directed    Discharge instructions   Complete by:  As directed    Dr. Gaynelle Arabian Total Joint Specialist [facility] [address], [place], [place] [address] [phone]  TOTAL KNEE REPLACEMENT POSTOPERATIVE DIRECTIONS  Knee Rehabilitation, Guidelines Following Surgery  Results after knee surgery are often greatly improved when you follow the exercise, range of motion and muscle strengthening exercises prescribed by your doctor. Safety measures are also important to protect the knee from further injury. Any time any of these exercises cause you to have increased pain or swelling in your knee joint, decrease the amount until you are comfortable again and slowly increase them. If you have problems or questions, call your caregiver or physical therapist for advice.   HOME CARE INSTRUCTIONS  Remove items at home which could result in a fall. This includes throw  rugs or furniture in walking pathways.  ICE to the affected knee every three hours for 30 minutes at a time and then as needed for pain and swelling.  Continue to use ice on the knee for pain and swelling from surgery. You may notice swelling that will progress down to the foot and ankle.  This is normal after surgery.  Elevate the leg when you are not up walking on it.   Continue to use the breathing machine which will help keep your temperature down.  It is common for your temperature to cycle up and down following surgery, especially at night when you are not up moving around and exerting yourself.  The breathing machine keeps your lungs expanded and your temperature down. Do not place pillow under knee, focus on keeping the knee straight while resting   DIET You may resume your previous home diet once your are discharged from the hospital.  DRESSING / WOUND CARE / SHOWERING You may shower 3 days after surgery, but keep the wounds dry during showering.  You may use an occlusive plastic wrap (Press'n Seal for example), NO SOAKING/SUBMERGING IN THE BATHTUB.  If the bandage gets wet, change with a clean dry gauze.  If the incision gets wet, pat the wound dry with a clean towel. You may start showering once you are discharged home but do not submerge the incision under water. Just pat the incision dry and apply a dry gauze dressing on daily. Change the surgical dressing daily and reapply a dry dressing each time.  ACTIVITY Walk with your walker as instructed. Use walker as long as suggested by your caregivers. Avoid periods of inactivity such as sitting longer than an hour when not asleep. This helps prevent blood clots.  You may resume a sexual relationship in one month or when given the OK by your doctor.  You may return to work once you are cleared by your doctor.  Do not drive a car for 6 weeks or until released by you surgeon.  Do not drive while taking narcotics.  WEIGHT BEARING Weight  bearing as tolerated with assist device (walker, cane, etc) as directed, use it as long as suggested by your surgeon or therapist, typically at least 4-6 weeks.  POSTOPERATIVE CONSTIPATION PROTOCOL Constipation - defined medically  as fewer than three stools per week and severe constipation as less than one stool per week.  One of the most common issues patients have following surgery is constipation.  Even if you have a regular bowel pattern at home, your normal regimen is likely to be disrupted due to multiple reasons following surgery.  Combination of anesthesia, postoperative narcotics, change in appetite and fluid intake all can affect your bowels.  In order to avoid complications following surgery, here are some recommendations in order to help you during your recovery period.  Colace (docusate) - Pick up an over-the-counter form of Colace or another stool softener and take twice a day as long as you are requiring postoperative pain medications.  Take with a full glass of water daily.  If you experience loose stools or diarrhea, hold the colace until you stool forms back up.  If your symptoms do not get better within 1 week or if they get worse, check with your doctor.  Dulcolax (bisacodyl) - Pick up over-the-counter and take as directed by the product packaging as needed to assist with the movement of your bowels.  Take with a full glass of water.  Use this product as needed if not relieved by Colace only.   MiraLax (polyethylene glycol) - Pick up over-the-counter to have on hand.  MiraLax is a solution that will increase the amount of water in your bowels to assist with bowel movements.  Take as directed and can mix with a glass of water, juice, soda, coffee, or tea.  Take if you go more than two days without a movement. Do not use MiraLax more than once per day. Call your doctor if you are still constipated or irregular after using this medication for 7 days in a row.  If you continue to have  problems with postoperative constipation, please contact the office for further assistance and recommendations.  If you experience "the worst abdominal pain ever" or develop nausea or vomiting, please contact the office immediatly for further recommendations for treatment.  ITCHING  If you experience itching with your medications, try taking only a single pain pill, or even half a pain pill at a time.  You can also use Benadryl over the counter for itching or also to help with sleep.   TED HOSE STOCKINGS Wear the elastic stockings on both legs for three weeks following surgery during the day but you may remove then at night for sleeping.  MEDICATIONS See your medication summary on the "After Visit Summary" that the nursing staff will review with you prior to discharge.  You may have some home medications which will be placed on hold until you complete the course of blood thinner medication.  It is important for you to complete the blood thinner medication as prescribed by your surgeon.  Continue your approved medications as instructed at time of discharge.  PRECAUTIONS If you experience chest pain or shortness of breath - call 911 immediately for transfer to the hospital emergency department.  If you develop a fever greater that 101 F, purulent drainage from wound, increased redness or drainage from wound, foul odor from the wound/dressing, or calf pain - CONTACT YOUR SURGEON.                                                   FOLLOW-UP APPOINTMENTS Make sure  you keep all of your appointments after your operation with your surgeon and caregivers. You should call the office at the above phone number and make an appointment for approximately two weeks after the date of your surgery or on the date instructed by your surgeon outlined in the "After Visit Summary".   RANGE OF MOTION AND STRENGTHENING EXERCISES  Rehabilitation of the knee is important following a knee injury or an operation. After just a  few days of immobilization, the muscles of the thigh which control the knee become weakened and shrink (atrophy). Knee exercises are designed to build up the tone and strength of the thigh muscles and to improve knee motion. Often times heat used for twenty to thirty minutes before working out will loosen up your tissues and help with improving the range of motion but do not use heat for the first two weeks following surgery. These exercises can be done on a training (exercise) mat, on the floor, on a table or on a bed. Use what ever works the best and is most comfortable for you Knee exercises include:  Leg Lifts - While your knee is still immobilized in a splint or cast, you can do straight leg raises. Lift the leg to 60 degrees, hold for 3 sec, and slowly lower the leg. Repeat 10-20 times 2-3 times daily. Perform this exercise against resistance later as your knee gets better.  Quad and Hamstring Sets - Tighten up the muscle on the front of the thigh (Quad) and hold for 5-10 sec. Repeat this 10-20 times hourly. Hamstring sets are done by pushing the foot backward against an object and holding for 5-10 sec. Repeat as with quad sets.  Leg Slides: Lying on your back, slowly slide your foot toward your buttocks, bending your knee up off the floor (only go as far as is comfortable). Then slowly slide your foot back down until your leg is flat on the floor again. Angel Wings: Lying on your back spread your legs to the side as far apart as you can without causing discomfort.  A rehabilitation program following serious knee injuries can speed recovery and prevent re-injury in the future due to weakened muscles. Contact your doctor or a physical therapist for more information on knee rehabilitation.   IF YOU ARE TRANSFERRED TO A SKILLED REHAB FACILITY If the patient is transferred to a skilled rehab facility following release from the hospital, a list of the current medications will be sent to the facility for the  patient to continue.  When discharged from the skilled rehab facility, please have the facility set up the patient's [place] prior to being released. Also, the skilled facility will be responsible for providing the patient with their medications at time of release from the facility to include their pain medication, the muscle relaxants, and their blood thinner medication. If the patient is still at the rehab facility at time of the two week follow up appointment, the skilled rehab facility will also need to assist the patient in arranging follow up appointment in our office and any transportation needs.  MAKE SURE YOU:  Understand these instructions.  Get help right away if you are not doing well or get worse.    Pick up stool softner and laxative for home use following surgery while on pain medications. Do not submerge incision under water. Please use good hand washing techniques while changing dressing each day. May shower starting three days after surgery. Please use a clean  towel to pat the incision dry following showers. Continue to use ice for pain and swelling after surgery. Do not use any lotions or creams on the incision until instructed by your surgeon.   Do not put a pillow under the knee. Place it under the heel.   Complete by:  As directed    Driving restrictions   Complete by:  As directed    No driving for two weeks   TED hose   Complete by:  As directed    Use stockings (TED hose) for three weeks on both leg(s).  You may remove them at night for sleeping.   Weight bearing as tolerated   Complete by:  As directed      Allergies as of 03/10/2018      Reactions   Augmentin [amoxicillin-pot Clavulanate] Rash   Codeine Rash   REACTION: hyperactivity, itching      Medication List    STOP taking these medications   aspirin 81 MG tablet   CALTRATE PLUS PO   CENTRUM PO   cholecalciferol 25 MCG (1000 UT) tablet Commonly known as:  VITAMIN D3      TAKE these medications   acetaminophen 500 MG tablet Commonly known as:  TYLENOL Take 1,000 mg by mouth every 6 (six) hours as needed for pain.   atorvastatin 10 MG tablet Commonly known as:  LIPITOR Take 10 mg by mouth daily.   cycloSPORINE 0.05 % ophthalmic emulsion Commonly known as:  RESTASIS Place 1 drop into both eyes 2 (two) times daily.   gabapentin 300 MG capsule Commonly known as:  NEURONTIN Take 300 mg by mouth at bedtime.   HYDROmorphone 2 MG tablet Commonly known as:  DILAUDID Take 1 tablet (2 mg total) by mouth every 6 (six) hours as needed for moderate pain.   losartan 50 MG tablet Commonly known as:  COZAAR Take 50 mg by mouth daily.   methocarbamol 500 MG tablet Commonly known as:  ROBAXIN Take 1 tablet (500 mg total) by mouth every 6 (six) hours as needed for muscle spasms.   potassium chloride SA 20 MEQ tablet Commonly known as:  K-DUR,KLOR-CON Take 20 mEq by mouth 2 (two) times daily.   propranolol 20 MG tablet Commonly known as:  INDERAL Take 20 mg by mouth 2 (two) times daily.   rivaroxaban 10 MG Tabs tablet Commonly known as:  XARELTO Take 1 tablet (10 mg total) by mouth daily with breakfast for 20 days. Then resume one 81 mg aspirin once a day.   SYSTANE PRESERVATIVE FREE 0.4-0.3 % Soln Generic drug:  Polyethyl Glyc-Propyl Glyc PF Place 1 drop into both eyes 3 (three) times daily as needed (dry eyes).   triamterene-hydrochlorothiazide 75-50 MG tablet Commonly known as:  MAXZIDE Take 0.5 tablets by mouth daily.   venlafaxine 37.5 MG tablet Commonly known as:  EFFEXOR TAKE 1 TABLET BY MOUTH TWICE A DAY   verapamil 180 MG 24 hr capsule Commonly known as:  VERELAN PM Take 180 mg by mouth every morning.            Discharge Care Instructions  (From admission, onward)         Start     Ordered   03/10/18 0000  Weight bearing as tolerated     03/10/18 0749   03/10/18 0000  Change dressing    Comments:  Change dressing on  Wednesday, then change the dressing daily with sterile 4 x 4 inch gauze dressing and apply TED hose.  03/10/18 0749         Follow-up Information    [facility]. Go on 03/13/2018.   Why:  You are scheduled for Physical Therapy at [facility] on 03-13-18 at 11:00 am.  Your appointment is with Christena Flake, MD. Go on 03/24/2018.   Specialty:  Orthopedic Surgery Why:  You are scheduled for a post-op appointment with Dr. Wynelle Link on 03-24-18 at 2:15 pm Contact information: [address] [place] [address] [phone]        [facility] - Dme Follow up.   Contact information: [address] [place] [address] [phone]           Signed: Theresa Duty, PA-C Orthopedic Surgery 03/11/2018, 8:40 AM

## 2018-03-12 ENCOUNTER — Encounter (HOSPITAL_COMMUNITY): Payer: Self-pay | Admitting: Orthopedic Surgery

## 2018-03-13 DIAGNOSIS — M25562 Pain in left knee: Secondary | ICD-10-CM | POA: Diagnosis not present

## 2018-03-18 DIAGNOSIS — M25562 Pain in left knee: Secondary | ICD-10-CM | POA: Diagnosis not present

## 2018-03-23 DIAGNOSIS — M25562 Pain in left knee: Secondary | ICD-10-CM | POA: Diagnosis not present

## 2018-03-25 DIAGNOSIS — M25562 Pain in left knee: Secondary | ICD-10-CM | POA: Diagnosis not present

## 2018-03-27 DIAGNOSIS — M25562 Pain in left knee: Secondary | ICD-10-CM | POA: Diagnosis not present

## 2018-03-30 DIAGNOSIS — M25562 Pain in left knee: Secondary | ICD-10-CM | POA: Diagnosis not present

## 2018-04-01 DIAGNOSIS — M25562 Pain in left knee: Secondary | ICD-10-CM | POA: Diagnosis not present

## 2018-04-03 DIAGNOSIS — M25562 Pain in left knee: Secondary | ICD-10-CM | POA: Diagnosis not present

## 2018-04-06 DIAGNOSIS — M25562 Pain in left knee: Secondary | ICD-10-CM | POA: Diagnosis not present

## 2018-04-08 DIAGNOSIS — M25562 Pain in left knee: Secondary | ICD-10-CM | POA: Diagnosis not present

## 2018-04-13 DIAGNOSIS — M25562 Pain in left knee: Secondary | ICD-10-CM | POA: Diagnosis not present

## 2018-04-14 DIAGNOSIS — Z96652 Presence of left artificial knee joint: Secondary | ICD-10-CM | POA: Diagnosis not present

## 2018-04-14 DIAGNOSIS — M25572 Pain in left ankle and joints of left foot: Secondary | ICD-10-CM | POA: Diagnosis not present

## 2018-04-14 DIAGNOSIS — Z471 Aftercare following joint replacement surgery: Secondary | ICD-10-CM | POA: Diagnosis not present

## 2018-04-24 DIAGNOSIS — M25562 Pain in left knee: Secondary | ICD-10-CM | POA: Diagnosis not present

## 2018-04-28 DIAGNOSIS — M25562 Pain in left knee: Secondary | ICD-10-CM | POA: Diagnosis not present

## 2018-05-01 DIAGNOSIS — M25562 Pain in left knee: Secondary | ICD-10-CM | POA: Diagnosis not present

## 2018-05-05 DIAGNOSIS — E7849 Other hyperlipidemia: Secondary | ICD-10-CM | POA: Diagnosis not present

## 2018-05-05 DIAGNOSIS — R82998 Other abnormal findings in urine: Secondary | ICD-10-CM | POA: Diagnosis not present

## 2018-05-05 DIAGNOSIS — M25562 Pain in left knee: Secondary | ICD-10-CM | POA: Diagnosis not present

## 2018-05-05 DIAGNOSIS — I10 Essential (primary) hypertension: Secondary | ICD-10-CM | POA: Diagnosis not present

## 2018-05-05 DIAGNOSIS — M859 Disorder of bone density and structure, unspecified: Secondary | ICD-10-CM | POA: Diagnosis not present

## 2018-05-08 DIAGNOSIS — M25562 Pain in left knee: Secondary | ICD-10-CM | POA: Diagnosis not present

## 2018-05-12 DIAGNOSIS — M859 Disorder of bone density and structure, unspecified: Secondary | ICD-10-CM | POA: Diagnosis not present

## 2018-05-12 DIAGNOSIS — E668 Other obesity: Secondary | ICD-10-CM | POA: Diagnosis not present

## 2018-05-12 DIAGNOSIS — M25562 Pain in left knee: Secondary | ICD-10-CM | POA: Diagnosis not present

## 2018-05-12 DIAGNOSIS — K5909 Other constipation: Secondary | ICD-10-CM | POA: Diagnosis not present

## 2018-05-12 DIAGNOSIS — Z1389 Encounter for screening for other disorder: Secondary | ICD-10-CM | POA: Diagnosis not present

## 2018-05-12 DIAGNOSIS — C50919 Malignant neoplasm of unspecified site of unspecified female breast: Secondary | ICD-10-CM | POA: Diagnosis not present

## 2018-05-12 DIAGNOSIS — Z6830 Body mass index (BMI) 30.0-30.9, adult: Secondary | ICD-10-CM | POA: Diagnosis not present

## 2018-05-12 DIAGNOSIS — M545 Low back pain: Secondary | ICD-10-CM | POA: Diagnosis not present

## 2018-05-12 DIAGNOSIS — M25511 Pain in right shoulder: Secondary | ICD-10-CM | POA: Diagnosis not present

## 2018-05-12 DIAGNOSIS — E7849 Other hyperlipidemia: Secondary | ICD-10-CM | POA: Diagnosis not present

## 2018-05-12 DIAGNOSIS — R5383 Other fatigue: Secondary | ICD-10-CM | POA: Diagnosis not present

## 2018-05-12 DIAGNOSIS — I10 Essential (primary) hypertension: Secondary | ICD-10-CM | POA: Diagnosis not present

## 2018-05-12 DIAGNOSIS — Z0000 Encounter for general adult medical examination without abnormal findings: Secondary | ICD-10-CM | POA: Diagnosis not present

## 2018-05-13 DIAGNOSIS — Z1212 Encounter for screening for malignant neoplasm of rectum: Secondary | ICD-10-CM | POA: Diagnosis not present

## 2018-05-15 DIAGNOSIS — M25562 Pain in left knee: Secondary | ICD-10-CM | POA: Diagnosis not present

## 2018-05-19 DIAGNOSIS — M25562 Pain in left knee: Secondary | ICD-10-CM | POA: Diagnosis not present

## 2018-10-13 ENCOUNTER — Other Ambulatory Visit: Payer: Self-pay | Admitting: Oncology

## 2018-10-13 DIAGNOSIS — Z1231 Encounter for screening mammogram for malignant neoplasm of breast: Secondary | ICD-10-CM

## 2018-11-27 ENCOUNTER — Other Ambulatory Visit: Payer: Self-pay

## 2018-11-27 ENCOUNTER — Ambulatory Visit
Admission: RE | Admit: 2018-11-27 | Discharge: 2018-11-27 | Disposition: A | Discharge status: Home / Self Care | Payer: Medicare Other | Source: Ambulatory Visit | Attending: Oncology | Admitting: Oncology

## 2018-11-27 DIAGNOSIS — Z1231 Encounter for screening mammogram for malignant neoplasm of breast: Secondary | ICD-10-CM | POA: Diagnosis not present

## 2018-12-16 DIAGNOSIS — H35371 Puckering of macula, right eye: Secondary | ICD-10-CM | POA: Diagnosis not present

## 2018-12-16 DIAGNOSIS — H2513 Age-related nuclear cataract, bilateral: Secondary | ICD-10-CM | POA: Diagnosis not present

## 2018-12-16 DIAGNOSIS — M3501 Sicca syndrome with keratoconjunctivitis: Secondary | ICD-10-CM | POA: Diagnosis not present

## 2018-12-24 DIAGNOSIS — Z23 Encounter for immunization: Secondary | ICD-10-CM | POA: Diagnosis not present

## 2018-12-25 ENCOUNTER — Other Ambulatory Visit: Payer: Self-pay | Admitting: Oncology

## 2018-12-31 ENCOUNTER — Other Ambulatory Visit: Payer: Self-pay | Admitting: Oncology

## 2019-01-12 DIAGNOSIS — D1801 Hemangioma of skin and subcutaneous tissue: Secondary | ICD-10-CM | POA: Diagnosis not present

## 2019-01-12 DIAGNOSIS — L821 Other seborrheic keratosis: Secondary | ICD-10-CM | POA: Diagnosis not present

## 2019-01-12 DIAGNOSIS — Z85828 Personal history of other malignant neoplasm of skin: Secondary | ICD-10-CM | POA: Diagnosis not present

## 2019-02-10 DIAGNOSIS — H2513 Age-related nuclear cataract, bilateral: Secondary | ICD-10-CM | POA: Diagnosis not present

## 2019-03-04 DIAGNOSIS — Z471 Aftercare following joint replacement surgery: Secondary | ICD-10-CM | POA: Diagnosis not present

## 2019-03-04 DIAGNOSIS — Z96652 Presence of left artificial knee joint: Secondary | ICD-10-CM | POA: Diagnosis not present

## 2019-03-04 DIAGNOSIS — M1711 Unilateral primary osteoarthritis, right knee: Secondary | ICD-10-CM | POA: Diagnosis not present

## 2019-03-09 ENCOUNTER — Encounter: Payer: Self-pay | Admitting: Gastroenterology

## 2019-04-12 ENCOUNTER — Other Ambulatory Visit: Payer: Self-pay | Admitting: Oncology

## 2019-05-07 DIAGNOSIS — E7849 Other hyperlipidemia: Secondary | ICD-10-CM | POA: Diagnosis not present

## 2019-05-12 DIAGNOSIS — R82998 Other abnormal findings in urine: Secondary | ICD-10-CM | POA: Diagnosis not present

## 2019-05-12 DIAGNOSIS — I10 Essential (primary) hypertension: Secondary | ICD-10-CM | POA: Diagnosis not present

## 2019-05-14 DIAGNOSIS — I10 Essential (primary) hypertension: Secondary | ICD-10-CM | POA: Diagnosis not present

## 2019-05-14 DIAGNOSIS — M8580 Other specified disorders of bone density and structure, unspecified site: Secondary | ICD-10-CM | POA: Diagnosis not present

## 2019-05-14 DIAGNOSIS — Z1331 Encounter for screening for depression: Secondary | ICD-10-CM | POA: Diagnosis not present

## 2019-05-14 DIAGNOSIS — E785 Hyperlipidemia, unspecified: Secondary | ICD-10-CM | POA: Diagnosis not present

## 2019-05-14 DIAGNOSIS — Z0000 Encounter for general adult medical examination without abnormal findings: Secondary | ICD-10-CM | POA: Diagnosis not present

## 2019-05-14 DIAGNOSIS — Z1339 Encounter for screening examination for other mental health and behavioral disorders: Secondary | ICD-10-CM | POA: Diagnosis not present

## 2019-05-14 DIAGNOSIS — M545 Low back pain: Secondary | ICD-10-CM | POA: Diagnosis not present

## 2019-05-14 DIAGNOSIS — H269 Unspecified cataract: Secondary | ICD-10-CM | POA: Diagnosis not present

## 2019-05-14 DIAGNOSIS — E669 Obesity, unspecified: Secondary | ICD-10-CM | POA: Diagnosis not present

## 2019-05-14 DIAGNOSIS — C50919 Malignant neoplasm of unspecified site of unspecified female breast: Secondary | ICD-10-CM | POA: Diagnosis not present

## 2019-05-27 DIAGNOSIS — H2513 Age-related nuclear cataract, bilateral: Secondary | ICD-10-CM | POA: Diagnosis not present

## 2019-05-27 DIAGNOSIS — Z20822 Contact with and (suspected) exposure to covid-19: Secondary | ICD-10-CM | POA: Diagnosis not present

## 2019-05-27 DIAGNOSIS — Z9189 Other specified personal risk factors, not elsewhere classified: Secondary | ICD-10-CM | POA: Diagnosis not present

## 2019-05-27 DIAGNOSIS — Z01818 Encounter for other preprocedural examination: Secondary | ICD-10-CM | POA: Diagnosis not present

## 2019-06-02 ENCOUNTER — Other Ambulatory Visit: Payer: Self-pay | Admitting: Oncology

## 2019-06-04 ENCOUNTER — Other Ambulatory Visit: Payer: Self-pay | Admitting: *Deleted

## 2019-06-04 MED ORDER — VENLAFAXINE HCL ER 37.5 MG PO CP24: 37.5000 mg | ORAL_CAPSULE | Freq: Two times a day (BID) | ORAL | 0 refills | Status: DC

## 2019-06-11 DIAGNOSIS — Z20822 Contact with and (suspected) exposure to covid-19: Secondary | ICD-10-CM | POA: Diagnosis not present

## 2019-06-11 DIAGNOSIS — Z20828 Contact with and (suspected) exposure to other viral communicable diseases: Secondary | ICD-10-CM | POA: Diagnosis not present

## 2019-06-17 DIAGNOSIS — I10 Essential (primary) hypertension: Secondary | ICD-10-CM | POA: Diagnosis not present

## 2019-06-17 DIAGNOSIS — H25811 Combined forms of age-related cataract, right eye: Secondary | ICD-10-CM | POA: Diagnosis not present

## 2019-06-20 ENCOUNTER — Ambulatory Visit: Payer: Medicare Other | Attending: Internal Medicine

## 2019-06-20 DIAGNOSIS — Z23 Encounter for immunization: Secondary | ICD-10-CM | POA: Insufficient documentation

## 2019-06-20 NOTE — Progress Notes (Signed)
   Covid-19 Vaccination Clinic  Name:  Teresa Chapman    MRN: [id number] DOB: [birth year]  06/20/2019  Teresa Chapman was observed post Covid-19 immunization for 15 minutes without incidence. She was provided with Vaccine Information Sheet and instruction to access the V-Safe system.   Teresa Chapman was instructed to call 911 with any severe reactions post vaccine: Marland Kitchen Difficulty breathing  . Swelling of your face and throat  . A fast heartbeat  . A bad rash all over your body  . Dizziness and weakness    Immunizations Administered    Name Date Dose VIS Date Route   Pfizer COVID-19 Vaccine 06/20/2019 11:43 AM 0.3 mL 04/02/2019 Intramuscular   Manufacturer: [employer]   Lot: [id number]   [place]: [id number]

## 2019-06-25 DIAGNOSIS — Z96652 Presence of left artificial knee joint: Secondary | ICD-10-CM | POA: Diagnosis not present

## 2019-06-25 DIAGNOSIS — Z20828 Contact with and (suspected) exposure to other viral communicable diseases: Secondary | ICD-10-CM | POA: Diagnosis not present

## 2019-06-25 DIAGNOSIS — Z20822 Contact with and (suspected) exposure to covid-19: Secondary | ICD-10-CM | POA: Diagnosis not present

## 2019-06-25 DIAGNOSIS — M1711 Unilateral primary osteoarthritis, right knee: Secondary | ICD-10-CM | POA: Diagnosis not present

## 2019-07-01 DIAGNOSIS — Z85118 Personal history of other malignant neoplasm of bronchus and lung: Secondary | ICD-10-CM | POA: Diagnosis not present

## 2019-07-01 DIAGNOSIS — Z7982 Long term (current) use of aspirin: Secondary | ICD-10-CM | POA: Diagnosis not present

## 2019-07-01 DIAGNOSIS — I10 Essential (primary) hypertension: Secondary | ICD-10-CM | POA: Diagnosis not present

## 2019-07-01 DIAGNOSIS — Z79899 Other long term (current) drug therapy: Secondary | ICD-10-CM | POA: Diagnosis not present

## 2019-07-01 DIAGNOSIS — H25812 Combined forms of age-related cataract, left eye: Secondary | ICD-10-CM | POA: Diagnosis not present

## 2019-07-20 ENCOUNTER — Ambulatory Visit: Payer: Medicare Other | Attending: Internal Medicine

## 2019-07-20 DIAGNOSIS — Z23 Encounter for immunization: Secondary | ICD-10-CM

## 2019-07-20 NOTE — Progress Notes (Signed)
   Covid-19 Vaccination Clinic  Name:  Teresa Chapman    MRN: [id number] DOB: [birth year]  07/20/2019  [name] was observed post Covid-19 immunization for 15 minutes without incident. She was provided with Vaccine Information Sheet and instruction to access the V-Safe system.   [name] was instructed to call 911 with any severe reactions post vaccine: Marland Kitchen Difficulty breathing  . Swelling of face and throat  . A fast heartbeat  . A bad rash all over body  . Dizziness and weakness   Immunizations Administered    Name Date Dose VIS Date Route   Pfizer COVID-19 Vaccine 07/20/2019 11:11 AM 0.3 mL 04/02/2019 Intramuscular   Manufacturer: [employer]   Lot: [id number]   [place]: [id number]

## 2019-08-10 DIAGNOSIS — Z96652 Presence of left artificial knee joint: Secondary | ICD-10-CM | POA: Diagnosis not present

## 2019-08-10 DIAGNOSIS — M65862 Other synovitis and tenosynovitis, left lower leg: Secondary | ICD-10-CM | POA: Diagnosis not present

## 2019-10-27 ENCOUNTER — Other Ambulatory Visit: Payer: Self-pay | Admitting: Oncology

## 2019-10-27 DIAGNOSIS — Z1231 Encounter for screening mammogram for malignant neoplasm of breast: Secondary | ICD-10-CM

## 2019-11-04 DIAGNOSIS — M25562 Pain in left knee: Secondary | ICD-10-CM | POA: Diagnosis not present

## 2019-11-09 DIAGNOSIS — M25562 Pain in left knee: Secondary | ICD-10-CM | POA: Diagnosis not present

## 2019-11-11 DIAGNOSIS — M25562 Pain in left knee: Secondary | ICD-10-CM | POA: Diagnosis not present

## 2019-11-12 DIAGNOSIS — Z96652 Presence of left artificial knee joint: Secondary | ICD-10-CM | POA: Diagnosis not present

## 2019-11-12 DIAGNOSIS — M25561 Pain in right knee: Secondary | ICD-10-CM | POA: Diagnosis not present

## 2019-11-12 DIAGNOSIS — Z471 Aftercare following joint replacement surgery: Secondary | ICD-10-CM | POA: Diagnosis not present

## 2019-11-17 DIAGNOSIS — M25562 Pain in left knee: Secondary | ICD-10-CM | POA: Diagnosis not present

## 2019-11-19 DIAGNOSIS — M545 Low back pain: Secondary | ICD-10-CM | POA: Diagnosis not present

## 2019-11-19 DIAGNOSIS — M25561 Pain in right knee: Secondary | ICD-10-CM | POA: Diagnosis not present

## 2019-11-22 DIAGNOSIS — E669 Obesity, unspecified: Secondary | ICD-10-CM | POA: Diagnosis not present

## 2019-11-22 DIAGNOSIS — M545 Low back pain: Secondary | ICD-10-CM | POA: Diagnosis not present

## 2019-11-22 DIAGNOSIS — M25562 Pain in left knee: Secondary | ICD-10-CM | POA: Diagnosis not present

## 2019-11-22 DIAGNOSIS — M179 Osteoarthritis of knee, unspecified: Secondary | ICD-10-CM | POA: Diagnosis not present

## 2019-11-22 DIAGNOSIS — E785 Hyperlipidemia, unspecified: Secondary | ICD-10-CM | POA: Diagnosis not present

## 2019-11-22 DIAGNOSIS — I10 Essential (primary) hypertension: Secondary | ICD-10-CM | POA: Diagnosis not present

## 2019-11-30 ENCOUNTER — Ambulatory Visit
Admission: RE | Admit: 2019-11-30 | Discharge: 2019-11-30 | Disposition: A | Discharge status: Home / Self Care | Payer: Medicare Other | Source: Ambulatory Visit | Attending: Oncology | Admitting: Oncology

## 2019-11-30 ENCOUNTER — Other Ambulatory Visit: Payer: Self-pay

## 2019-11-30 DIAGNOSIS — Z1231 Encounter for screening mammogram for malignant neoplasm of breast: Secondary | ICD-10-CM | POA: Diagnosis not present

## 2019-12-03 DIAGNOSIS — M25561 Pain in right knee: Secondary | ICD-10-CM | POA: Diagnosis not present

## 2019-12-03 DIAGNOSIS — M545 Low back pain: Secondary | ICD-10-CM | POA: Diagnosis not present

## 2019-12-09 DIAGNOSIS — M25561 Pain in right knee: Secondary | ICD-10-CM | POA: Diagnosis not present

## 2020-01-12 DIAGNOSIS — L820 Inflamed seborrheic keratosis: Secondary | ICD-10-CM | POA: Diagnosis not present

## 2020-01-12 DIAGNOSIS — Z85828 Personal history of other malignant neoplasm of skin: Secondary | ICD-10-CM | POA: Diagnosis not present

## 2020-01-12 DIAGNOSIS — L821 Other seborrheic keratosis: Secondary | ICD-10-CM | POA: Diagnosis not present

## 2020-01-12 DIAGNOSIS — L720 Epidermal cyst: Secondary | ICD-10-CM | POA: Diagnosis not present

## 2020-01-12 DIAGNOSIS — D2371 Other benign neoplasm of skin of right lower limb, including hip: Secondary | ICD-10-CM | POA: Diagnosis not present

## 2020-01-18 DIAGNOSIS — Z23 Encounter for immunization: Secondary | ICD-10-CM | POA: Diagnosis not present

## 2020-01-24 DIAGNOSIS — I10 Essential (primary) hypertension: Secondary | ICD-10-CM | POA: Diagnosis not present

## 2020-01-24 DIAGNOSIS — E785 Hyperlipidemia, unspecified: Secondary | ICD-10-CM | POA: Diagnosis not present

## 2020-01-24 DIAGNOSIS — M25562 Pain in left knee: Secondary | ICD-10-CM | POA: Diagnosis not present

## 2020-01-24 DIAGNOSIS — C50919 Malignant neoplasm of unspecified site of unspecified female breast: Secondary | ICD-10-CM | POA: Diagnosis not present

## 2020-01-24 DIAGNOSIS — E669 Obesity, unspecified: Secondary | ICD-10-CM | POA: Diagnosis not present

## 2020-02-19 ENCOUNTER — Ambulatory Visit: Payer: Medicare Other | Attending: Internal Medicine

## 2020-02-19 DIAGNOSIS — Z23 Encounter for immunization: Secondary | ICD-10-CM

## 2020-02-19 NOTE — Progress Notes (Signed)
   Covid-19 Vaccination Clinic  Name:  Teresa Chapman    MRN: [id number] DOB: August 11, 1940  02/19/2020  Teresa Chapman was observed post Covid-19 immunization for 15 minutes without incident. She was provided with Vaccine Information Sheet and instruction to access the V-Safe system.   Teresa Chapman was instructed to call 911 with any severe reactions post vaccine: Marland Kitchen Difficulty breathing  . Swelling of face and throat  . A fast heartbeat  . A bad rash all over body  . Dizziness and weakness

## 2020-02-23 DIAGNOSIS — M170 Bilateral primary osteoarthritis of knee: Secondary | ICD-10-CM | POA: Diagnosis not present

## 2020-03-02 DIAGNOSIS — S82015D Nondisplaced osteochondral fracture of left patella, subsequent encounter for closed fracture with routine healing: Secondary | ICD-10-CM | POA: Diagnosis not present

## 2020-03-02 DIAGNOSIS — M1711 Unilateral primary osteoarthritis, right knee: Secondary | ICD-10-CM | POA: Diagnosis not present

## 2020-03-08 DIAGNOSIS — Z9841 Cataract extraction status, right eye: Secondary | ICD-10-CM | POA: Diagnosis not present

## 2020-03-08 DIAGNOSIS — Z9842 Cataract extraction status, left eye: Secondary | ICD-10-CM | POA: Diagnosis not present

## 2020-03-08 DIAGNOSIS — M1711 Unilateral primary osteoarthritis, right knee: Secondary | ICD-10-CM | POA: Diagnosis not present

## 2020-03-08 DIAGNOSIS — Z961 Presence of intraocular lens: Secondary | ICD-10-CM | POA: Diagnosis not present

## 2020-03-15 DIAGNOSIS — M1711 Unilateral primary osteoarthritis, right knee: Secondary | ICD-10-CM | POA: Diagnosis not present

## 2020-04-28 DIAGNOSIS — M25561 Pain in right knee: Secondary | ICD-10-CM | POA: Diagnosis not present

## 2020-04-28 DIAGNOSIS — M1711 Unilateral primary osteoarthritis, right knee: Secondary | ICD-10-CM | POA: Diagnosis not present

## 2020-05-12 DIAGNOSIS — E785 Hyperlipidemia, unspecified: Secondary | ICD-10-CM | POA: Diagnosis not present

## 2020-05-12 DIAGNOSIS — M859 Disorder of bone density and structure, unspecified: Secondary | ICD-10-CM | POA: Diagnosis not present

## 2020-05-18 DIAGNOSIS — R82998 Other abnormal findings in urine: Secondary | ICD-10-CM | POA: Diagnosis not present

## 2020-05-19 DIAGNOSIS — M25519 Pain in unspecified shoulder: Secondary | ICD-10-CM | POA: Diagnosis not present

## 2020-05-19 DIAGNOSIS — E785 Hyperlipidemia, unspecified: Secondary | ICD-10-CM | POA: Diagnosis not present

## 2020-05-19 DIAGNOSIS — K5900 Constipation, unspecified: Secondary | ICD-10-CM | POA: Diagnosis not present

## 2020-05-19 DIAGNOSIS — Z0000 Encounter for general adult medical examination without abnormal findings: Secondary | ICD-10-CM | POA: Diagnosis not present

## 2020-05-19 DIAGNOSIS — I10 Essential (primary) hypertension: Secondary | ICD-10-CM | POA: Diagnosis not present

## 2020-05-19 DIAGNOSIS — M179 Osteoarthritis of knee, unspecified: Secondary | ICD-10-CM | POA: Diagnosis not present

## 2020-05-24 ENCOUNTER — Other Ambulatory Visit: Payer: Self-pay | Admitting: Oncology

## 2020-06-03 ENCOUNTER — Other Ambulatory Visit: Payer: Self-pay | Admitting: Oncology

## 2020-07-30 DIAGNOSIS — X58XXXA Exposure to other specified factors, initial encounter: Secondary | ICD-10-CM | POA: Diagnosis not present

## 2020-07-30 DIAGNOSIS — S0511XA Contusion of eyeball and orbital tissues, right eye, initial encounter: Secondary | ICD-10-CM | POA: Diagnosis not present

## 2020-09-19 DIAGNOSIS — M25512 Pain in left shoulder: Secondary | ICD-10-CM | POA: Diagnosis not present

## 2020-09-19 DIAGNOSIS — G8929 Other chronic pain: Secondary | ICD-10-CM | POA: Diagnosis not present

## 2020-09-19 DIAGNOSIS — I10 Essential (primary) hypertension: Secondary | ICD-10-CM | POA: Diagnosis not present

## 2020-09-19 DIAGNOSIS — M25561 Pain in right knee: Secondary | ICD-10-CM | POA: Diagnosis not present

## 2020-09-21 ENCOUNTER — Ambulatory Visit: Payer: Medicare Other

## 2020-09-28 DIAGNOSIS — M1711 Unilateral primary osteoarthritis, right knee: Secondary | ICD-10-CM | POA: Diagnosis not present

## 2020-10-05 DIAGNOSIS — M1712 Unilateral primary osteoarthritis, left knee: Secondary | ICD-10-CM | POA: Diagnosis not present

## 2020-10-05 DIAGNOSIS — M1711 Unilateral primary osteoarthritis, right knee: Secondary | ICD-10-CM | POA: Diagnosis not present

## 2020-10-05 DIAGNOSIS — M170 Bilateral primary osteoarthritis of knee: Secondary | ICD-10-CM | POA: Diagnosis not present

## 2020-10-12 DIAGNOSIS — M1712 Unilateral primary osteoarthritis, left knee: Secondary | ICD-10-CM | POA: Diagnosis not present

## 2020-10-12 DIAGNOSIS — M170 Bilateral primary osteoarthritis of knee: Secondary | ICD-10-CM | POA: Diagnosis not present

## 2020-10-12 DIAGNOSIS — M1711 Unilateral primary osteoarthritis, right knee: Secondary | ICD-10-CM | POA: Diagnosis not present

## 2020-10-29 DIAGNOSIS — Z20822 Contact with and (suspected) exposure to covid-19: Secondary | ICD-10-CM | POA: Diagnosis not present

## 2020-10-30 ENCOUNTER — Other Ambulatory Visit: Payer: Self-pay | Admitting: Oncology

## 2020-10-30 DIAGNOSIS — Z1231 Encounter for screening mammogram for malignant neoplasm of breast: Secondary | ICD-10-CM

## 2020-11-23 DIAGNOSIS — M170 Bilateral primary osteoarthritis of knee: Secondary | ICD-10-CM | POA: Diagnosis not present

## 2020-12-21 ENCOUNTER — Ambulatory Visit
Admission: RE | Admit: 2020-12-21 | Discharge: 2020-12-21 | Disposition: A | Discharge status: Home / Self Care | Payer: Medicare Other | Source: Ambulatory Visit | Attending: Oncology | Admitting: Oncology

## 2020-12-21 ENCOUNTER — Other Ambulatory Visit: Payer: Self-pay

## 2020-12-21 DIAGNOSIS — Z1231 Encounter for screening mammogram for malignant neoplasm of breast: Secondary | ICD-10-CM | POA: Diagnosis not present

## 2021-01-11 DIAGNOSIS — L821 Other seborrheic keratosis: Secondary | ICD-10-CM | POA: Diagnosis not present

## 2021-01-11 DIAGNOSIS — Z85828 Personal history of other malignant neoplasm of skin: Secondary | ICD-10-CM | POA: Diagnosis not present

## 2021-01-11 DIAGNOSIS — L700 Acne vulgaris: Secondary | ICD-10-CM | POA: Diagnosis not present

## 2021-01-11 DIAGNOSIS — D1801 Hemangioma of skin and subcutaneous tissue: Secondary | ICD-10-CM | POA: Diagnosis not present

## 2021-01-11 DIAGNOSIS — D2371 Other benign neoplasm of skin of right lower limb, including hip: Secondary | ICD-10-CM | POA: Diagnosis not present

## 2021-01-20 DIAGNOSIS — Z23 Encounter for immunization: Secondary | ICD-10-CM | POA: Diagnosis not present

## 2021-02-08 DIAGNOSIS — Z23 Encounter for immunization: Secondary | ICD-10-CM | POA: Diagnosis not present

## 2021-03-19 DIAGNOSIS — H04123 Dry eye syndrome of bilateral lacrimal glands: Secondary | ICD-10-CM | POA: Diagnosis not present

## 2021-03-19 DIAGNOSIS — Z961 Presence of intraocular lens: Secondary | ICD-10-CM | POA: Diagnosis not present

## 2021-05-29 DIAGNOSIS — E785 Hyperlipidemia, unspecified: Secondary | ICD-10-CM | POA: Diagnosis not present

## 2021-05-29 DIAGNOSIS — I10 Essential (primary) hypertension: Secondary | ICD-10-CM | POA: Diagnosis not present

## 2021-05-29 DIAGNOSIS — Z79899 Other long term (current) drug therapy: Secondary | ICD-10-CM | POA: Diagnosis not present

## 2021-05-29 DIAGNOSIS — M859 Disorder of bone density and structure, unspecified: Secondary | ICD-10-CM | POA: Diagnosis not present

## 2021-06-05 DIAGNOSIS — E785 Hyperlipidemia, unspecified: Secondary | ICD-10-CM | POA: Diagnosis not present

## 2021-06-05 DIAGNOSIS — I10 Essential (primary) hypertension: Secondary | ICD-10-CM | POA: Diagnosis not present

## 2021-06-05 DIAGNOSIS — Z853 Personal history of malignant neoplasm of breast: Secondary | ICD-10-CM | POA: Diagnosis not present

## 2021-06-05 DIAGNOSIS — R82998 Other abnormal findings in urine: Secondary | ICD-10-CM | POA: Diagnosis not present

## 2021-06-05 DIAGNOSIS — M179 Osteoarthritis of knee, unspecified: Secondary | ICD-10-CM | POA: Diagnosis not present

## 2021-06-05 DIAGNOSIS — F3289 Other specified depressive episodes: Secondary | ICD-10-CM | POA: Diagnosis not present

## 2021-06-05 DIAGNOSIS — M25512 Pain in left shoulder: Secondary | ICD-10-CM | POA: Diagnosis not present

## 2021-06-05 DIAGNOSIS — Z0000 Encounter for general adult medical examination without abnormal findings: Secondary | ICD-10-CM | POA: Diagnosis not present

## 2021-06-05 DIAGNOSIS — M8580 Other specified disorders of bone density and structure, unspecified site: Secondary | ICD-10-CM | POA: Diagnosis not present

## 2021-06-06 DIAGNOSIS — Z961 Presence of intraocular lens: Secondary | ICD-10-CM | POA: Diagnosis not present

## 2021-06-06 DIAGNOSIS — H04123 Dry eye syndrome of bilateral lacrimal glands: Secondary | ICD-10-CM | POA: Diagnosis not present

## 2021-06-15 DIAGNOSIS — M1711 Unilateral primary osteoarthritis, right knee: Secondary | ICD-10-CM | POA: Diagnosis not present

## 2021-06-22 DIAGNOSIS — M1711 Unilateral primary osteoarthritis, right knee: Secondary | ICD-10-CM | POA: Diagnosis not present

## 2021-06-29 DIAGNOSIS — M1711 Unilateral primary osteoarthritis, right knee: Secondary | ICD-10-CM | POA: Diagnosis not present

## 2021-08-16 DIAGNOSIS — M1711 Unilateral primary osteoarthritis, right knee: Secondary | ICD-10-CM | POA: Diagnosis not present

## 2021-08-23 DIAGNOSIS — Z20822 Contact with and (suspected) exposure to covid-19: Secondary | ICD-10-CM | POA: Diagnosis not present

## 2021-11-20 ENCOUNTER — Other Ambulatory Visit: Payer: Self-pay | Admitting: Internal Medicine

## 2021-11-20 DIAGNOSIS — Z1231 Encounter for screening mammogram for malignant neoplasm of breast: Secondary | ICD-10-CM

## 2021-12-26 ENCOUNTER — Ambulatory Visit
Admission: RE | Admit: 2021-12-26 | Discharge: 2021-12-26 | Disposition: A | Discharge status: Home / Self Care | Payer: Medicare Other | Source: Ambulatory Visit | Attending: Internal Medicine | Admitting: Internal Medicine

## 2021-12-26 DIAGNOSIS — Z1231 Encounter for screening mammogram for malignant neoplasm of breast: Secondary | ICD-10-CM

## 2022-01-03 DIAGNOSIS — M5441 Lumbago with sciatica, right side: Secondary | ICD-10-CM | POA: Diagnosis not present

## 2022-01-03 DIAGNOSIS — G8929 Other chronic pain: Secondary | ICD-10-CM | POA: Diagnosis not present

## 2022-01-03 DIAGNOSIS — E785 Hyperlipidemia, unspecified: Secondary | ICD-10-CM | POA: Diagnosis not present

## 2022-01-03 DIAGNOSIS — I10 Essential (primary) hypertension: Secondary | ICD-10-CM | POA: Diagnosis not present

## 2022-01-15 DIAGNOSIS — L820 Inflamed seborrheic keratosis: Secondary | ICD-10-CM | POA: Diagnosis not present

## 2022-01-15 DIAGNOSIS — Z85828 Personal history of other malignant neoplasm of skin: Secondary | ICD-10-CM | POA: Diagnosis not present

## 2022-01-15 DIAGNOSIS — D2271 Melanocytic nevi of right lower limb, including hip: Secondary | ICD-10-CM | POA: Diagnosis not present

## 2022-01-15 DIAGNOSIS — L821 Other seborrheic keratosis: Secondary | ICD-10-CM | POA: Diagnosis not present

## 2022-01-15 DIAGNOSIS — D225 Melanocytic nevi of trunk: Secondary | ICD-10-CM | POA: Diagnosis not present

## 2022-01-15 DIAGNOSIS — D2272 Melanocytic nevi of left lower limb, including hip: Secondary | ICD-10-CM | POA: Diagnosis not present

## 2022-01-22 ENCOUNTER — Other Ambulatory Visit: Payer: Self-pay | Admitting: Internal Medicine

## 2022-01-22 DIAGNOSIS — M5441 Lumbago with sciatica, right side: Secondary | ICD-10-CM

## 2022-01-26 DIAGNOSIS — Z23 Encounter for immunization: Secondary | ICD-10-CM | POA: Diagnosis not present

## 2022-02-02 ENCOUNTER — Other Ambulatory Visit: Payer: Medicare Other

## 2022-02-04 ENCOUNTER — Ambulatory Visit
Admission: RE | Admit: 2022-02-04 | Discharge: 2022-02-04 | Disposition: A | Discharge status: Home / Self Care | Payer: Medicare Other | Source: Ambulatory Visit | Attending: Internal Medicine | Admitting: Internal Medicine

## 2022-02-04 DIAGNOSIS — M48061 Spinal stenosis, lumbar region without neurogenic claudication: Secondary | ICD-10-CM | POA: Diagnosis not present

## 2022-02-04 DIAGNOSIS — M545 Low back pain, unspecified: Secondary | ICD-10-CM | POA: Diagnosis not present

## 2022-02-04 DIAGNOSIS — M5441 Lumbago with sciatica, right side: Secondary | ICD-10-CM

## 2022-02-07 DIAGNOSIS — Z23 Encounter for immunization: Secondary | ICD-10-CM | POA: Diagnosis not present

## 2022-03-07 DIAGNOSIS — M1711 Unilateral primary osteoarthritis, right knee: Secondary | ICD-10-CM | POA: Diagnosis not present

## 2022-03-13 DIAGNOSIS — M1711 Unilateral primary osteoarthritis, right knee: Secondary | ICD-10-CM | POA: Diagnosis not present

## 2022-03-21 DIAGNOSIS — M1711 Unilateral primary osteoarthritis, right knee: Secondary | ICD-10-CM | POA: Diagnosis not present

## 2022-06-26 DIAGNOSIS — Z961 Presence of intraocular lens: Secondary | ICD-10-CM | POA: Diagnosis not present

## 2022-06-26 DIAGNOSIS — H04123 Dry eye syndrome of bilateral lacrimal glands: Secondary | ICD-10-CM | POA: Diagnosis not present

## 2022-06-26 DIAGNOSIS — H18523 Epithelial (juvenile) corneal dystrophy, bilateral: Secondary | ICD-10-CM | POA: Diagnosis not present

## 2022-07-16 DIAGNOSIS — M8580 Other specified disorders of bone density and structure, unspecified site: Secondary | ICD-10-CM | POA: Diagnosis not present

## 2022-07-16 DIAGNOSIS — I10 Essential (primary) hypertension: Secondary | ICD-10-CM | POA: Diagnosis not present

## 2022-07-16 DIAGNOSIS — E785 Hyperlipidemia, unspecified: Secondary | ICD-10-CM | POA: Diagnosis not present

## 2022-07-16 DIAGNOSIS — R002 Palpitations: Secondary | ICD-10-CM | POA: Diagnosis not present

## 2022-07-16 DIAGNOSIS — R7989 Other specified abnormal findings of blood chemistry: Secondary | ICD-10-CM | POA: Diagnosis not present

## 2022-07-23 DIAGNOSIS — Z1339 Encounter for screening examination for other mental health and behavioral disorders: Secondary | ICD-10-CM | POA: Diagnosis not present

## 2022-07-23 DIAGNOSIS — Z0000 Encounter for general adult medical examination without abnormal findings: Secondary | ICD-10-CM | POA: Diagnosis not present

## 2022-07-23 DIAGNOSIS — R82998 Other abnormal findings in urine: Secondary | ICD-10-CM | POA: Diagnosis not present

## 2022-07-23 DIAGNOSIS — M79671 Pain in right foot: Secondary | ICD-10-CM | POA: Diagnosis not present

## 2022-07-23 DIAGNOSIS — I10 Essential (primary) hypertension: Secondary | ICD-10-CM | POA: Diagnosis not present

## 2022-07-23 DIAGNOSIS — M179 Osteoarthritis of knee, unspecified: Secondary | ICD-10-CM | POA: Diagnosis not present

## 2022-07-23 DIAGNOSIS — M5441 Lumbago with sciatica, right side: Secondary | ICD-10-CM | POA: Diagnosis not present

## 2022-07-23 DIAGNOSIS — Z853 Personal history of malignant neoplasm of breast: Secondary | ICD-10-CM | POA: Diagnosis not present

## 2022-07-23 DIAGNOSIS — Z1331 Encounter for screening for depression: Secondary | ICD-10-CM | POA: Diagnosis not present

## 2022-07-23 DIAGNOSIS — E785 Hyperlipidemia, unspecified: Secondary | ICD-10-CM | POA: Diagnosis not present

## 2022-10-09 DIAGNOSIS — Z961 Presence of intraocular lens: Secondary | ICD-10-CM | POA: Diagnosis not present

## 2022-10-09 DIAGNOSIS — H18523 Epithelial (juvenile) corneal dystrophy, bilateral: Secondary | ICD-10-CM | POA: Diagnosis not present

## 2022-10-09 DIAGNOSIS — H04123 Dry eye syndrome of bilateral lacrimal glands: Secondary | ICD-10-CM | POA: Diagnosis not present

## 2022-11-25 ENCOUNTER — Encounter: Payer: Self-pay | Admitting: Internal Medicine

## 2022-11-25 DIAGNOSIS — M25512 Pain in left shoulder: Secondary | ICD-10-CM | POA: Diagnosis not present

## 2022-11-25 DIAGNOSIS — Z0000 Encounter for general adult medical examination without abnormal findings: Secondary | ICD-10-CM

## 2022-11-25 DIAGNOSIS — M25511 Pain in right shoulder: Secondary | ICD-10-CM | POA: Diagnosis not present

## 2022-11-28 DIAGNOSIS — C50919 Malignant neoplasm of unspecified site of unspecified female breast: Secondary | ICD-10-CM | POA: Diagnosis not present

## 2022-11-28 DIAGNOSIS — N644 Mastodynia: Secondary | ICD-10-CM | POA: Diagnosis not present

## 2022-11-29 ENCOUNTER — Other Ambulatory Visit: Payer: Self-pay | Admitting: Family Medicine

## 2022-11-29 DIAGNOSIS — N644 Mastodynia: Secondary | ICD-10-CM

## 2022-12-02 ENCOUNTER — Other Ambulatory Visit: Payer: Self-pay | Admitting: Family Medicine

## 2022-12-02 DIAGNOSIS — N644 Mastodynia: Secondary | ICD-10-CM

## 2022-12-30 ENCOUNTER — Ambulatory Visit
Admission: RE | Admit: 2022-12-30 | Discharge: 2022-12-30 | Disposition: A | Discharge status: Home / Self Care | Payer: Medicare Other | Source: Ambulatory Visit | Attending: Family Medicine | Admitting: Family Medicine

## 2022-12-30 ENCOUNTER — Other Ambulatory Visit: Payer: Self-pay | Admitting: Family Medicine

## 2022-12-30 DIAGNOSIS — N6315 Unspecified lump in the right breast, overlapping quadrants: Secondary | ICD-10-CM | POA: Diagnosis not present

## 2022-12-30 DIAGNOSIS — N6312 Unspecified lump in the right breast, upper inner quadrant: Secondary | ICD-10-CM | POA: Diagnosis not present

## 2022-12-30 DIAGNOSIS — N644 Mastodynia: Secondary | ICD-10-CM

## 2022-12-30 DIAGNOSIS — N6310 Unspecified lump in the right breast, unspecified quadrant: Secondary | ICD-10-CM

## 2022-12-30 DIAGNOSIS — N6311 Unspecified lump in the right breast, upper outer quadrant: Secondary | ICD-10-CM | POA: Diagnosis not present

## 2022-12-30 DIAGNOSIS — R92333 Mammographic heterogeneous density, bilateral breasts: Secondary | ICD-10-CM | POA: Diagnosis not present

## 2022-12-30 DIAGNOSIS — M79622 Pain in left upper arm: Secondary | ICD-10-CM | POA: Diagnosis not present

## 2022-12-30 DIAGNOSIS — N944 Primary dysmenorrhea: Secondary | ICD-10-CM | POA: Diagnosis not present

## 2022-12-31 ENCOUNTER — Ambulatory Visit
Admission: RE | Admit: 2022-12-31 | Discharge: 2022-12-31 | Disposition: A | Discharge status: Home / Self Care | Payer: Medicare Other | Source: Ambulatory Visit | Attending: Family Medicine | Admitting: Family Medicine

## 2022-12-31 ENCOUNTER — Other Ambulatory Visit: Payer: Self-pay | Admitting: Internal Medicine

## 2022-12-31 DIAGNOSIS — N6310 Unspecified lump in the right breast, unspecified quadrant: Secondary | ICD-10-CM

## 2022-12-31 DIAGNOSIS — N6315 Unspecified lump in the right breast, overlapping quadrants: Secondary | ICD-10-CM | POA: Diagnosis not present

## 2022-12-31 DIAGNOSIS — C50811 Malignant neoplasm of overlapping sites of right female breast: Secondary | ICD-10-CM | POA: Diagnosis not present

## 2022-12-31 HISTORY — PX: BREAST BIOPSY: SHX20

## 2023-01-01 LAB — SURGICAL PATHOLOGY

## 2023-01-02 ENCOUNTER — Telehealth: Payer: Self-pay | Admitting: Hematology and Oncology

## 2023-01-02 NOTE — Telephone Encounter (Signed)
Spoke to patient to confirm upcoming afternoon [facility] clinic appointment on 9/18, paperwork will be sent via mail.   Gave location and time, also informed patient that the surgeon's office would be calling as well to get information from them similar to the packet that they will be receiving so make sure to do both.  Reminded patient that all providers will be coming to the clinic to see them HERE and if they had any questions to not hesitate to reach back out to myself or their navigators.

## 2023-01-06 ENCOUNTER — Encounter: Payer: Self-pay | Admitting: *Deleted

## 2023-01-06 DIAGNOSIS — Z170 Estrogen receptor positive status [ER+]: Secondary | ICD-10-CM | POA: Insufficient documentation

## 2023-01-07 NOTE — Progress Notes (Signed)
Radiation Oncology         [id number]) [phone] ________________________________  Initial Outpatient Consultation  Name: Teresa Chapman MRN: [id number]  Date: 01/08/2023  DOB: Nov 26, 1940  [id number], Barry Dienes, MD  [name], MD   REFERRING PHYSICIAN: [name], MD  DIAGNOSIS: No diagnosis found.   Cancer Staging  Malignant neoplasm of upper-inner quadrant of left breast in female, estrogen receptor positive (HCC) Staging form: Breast, AJCC 8th Edition - Clinical: Stage IA (cT1c, cN0, cM0, G2, ER+, PR+, HER2-) - Signed by Serena Croissant, MD on 01/08/2023 Stage prefix: Initial diagnosis Histologic grading system: 3 grade system Staging form: Breast, AJCC 7th Edition - Pathologic: No stage assigned - Unsigned Specimen type: Core Needle Biopsy Histopathologic type: 9931 Laterality: Left  Malignant neoplasm of upper-outer quadrant of right breast in female, estrogen receptor positive (HCC) Staging form: Breast, AJCC 8th Edition - Clinical: Stage IA (cT1c, cN0, cM0, G2, ER+, PR+, HER2-) - Signed by Serena Croissant, MD on 01/08/2023 Histologic grading system: 3 grade system   Stage 1A Right Breast UOQ, Invasive Ductal Carcinoma, ER+ / PR+ / Her2-, Grade 2  History of Left Breast, Invasive ductal carcinoma with low grade DCIS diagnosed in 2014, ER+ / PR+ / Her2- : s/p lumpectomy with left axillary SLN evaluation, XRT, and antiestrogen therapy under Dr. Darnelle Catalan    CHIEF COMPLAINT: Here to discuss management of right breast cancer  HISTORY OF PRESENT ILLNESS::Teresa Chapman is a 82 y.o. female who presented with left axillary pain along with a history left breast cancer diagnosed in 2014 (see above). She subsequently presented for a bilateral diagnostic mammogram and bilateral breast ultrasound on 12/30/22 which demonstrated: 2 abutting suspicious masses in the 12 o'clock right breast measuring 1.1 cm collectively. No evidence of malignancy in the left breast or left axilla were  appreciated to account for her left axillary pain. [place] also showed no evidence of right axillary lymphadenopathy.   Biopsy of the 12 o'clock right breast (5 cmfn) on date of 12/31/22 showed grade 2 invasive mammary/ductal carcinoma measuring 1.0 cm in the greatest linear extent of the sample.  ER status: 100% positive and PR status 30% positive, both with strong staining intensity; proliferation marker Ki67 at 20%; Her2 status negative; Grade 2. No lymph nodes were examined.  Patient denies any specific breast complaints today.   PREVIOUS RADIATION THERAPY: Yes - Dr. Michell Heinrich  Diagnosis:   T2N0 Left breast, UOQ, Invasive Ductal Carcinoma, ER+ / PR+ / Her2-     Indication for treatment:  Curative        Radiation treatment dates:   01/13/13-02/09/13   Site/dose:    Left breast / 42.5 @ 2.5 Gy per fraction x 17 fractions Left breast boost / 7.5 @ 2.5 Gy per fraction x 3 fractions   Beams/energy:   Opposed tangents with 10 MV photons were used on the initial fields with a 0.3 cm bolus daily on the scar. This was followed by a boost using 15 MeV electrons to the tumor cavity with margin. The plan was prescribed to the 90% IDL and was delivered in an enface direction.   PAST MEDICAL HISTORY:  has a past medical history of Anxiety, Asymptomatic PVCs, CKD (chronic kidney disease), stage III (HCC), Diverticulosis, Hepatitis B (1970's), History of basal cell carcinoma (BCC) excision, History of colon polyps, History of DVT of lower extremity, History of external beam radiation therapy (01-13-2013  to 02-09-2013), History of squamous cell carcinoma excision, Hyperlipidemia, Hypertension, Insomnia, Lung cancer (HCC) (2009),  Malignant neoplasm of upper-inner quadrant of left breast in female, estrogen receptor positive (HCC) (ONCOLOGIST-- DR Darnelle Catalan), Myocardial bridge, Osteoarthritis, Osteopenia, Personal history of radiation therapy (2014), PONV (postoperative nausea and vomiting), Scoliosis, SUI (stress  urinary incontinence, female), Wears glasses, and Wears glasses.    PAST SURGICAL HISTORY: Past Surgical History:  Procedure Laterality Date   BASAL CELL CARCINOMA EXCISION     "right face; RLE" (11/18/2012)   BREAST BIOPSY Right ~ 1962   "benign"    BREAST BIOPSY Right 10/2012   "benign cyst"    BREAST BIOPSY Left 10/2012   BREAST BIOPSY Right 12/31/2022   [place] RT BREAST BX W LOC DEV 1ST LESION IMG BX SPEC US GUIDE 12/31/2022 [facility]   BREAST EXCISIONAL BIOPSY Right    BREAST LUMPECTOMY Left 2014   BREAST LUMPECTOMY WITH NEEDLE LOCALIZATION AND AXILLARY SENTINEL LYMPH NODE BX Left 11/18/2012   Procedure: LEFT BREAST LUMPECTOMY WITH NEEDLE LOCALIZATION AND AXILLARY SENTINEL LYMPH NODE BX;  Surgeon: Emelia Loron, MD;  Location: [facility];  Service: General;  Laterality: Left;   CARDIAC CATHETERIZATION  03-26-2010   dr Jacinto Halim   normal coronaries,  LAD tortousity with intramyocardial bridge,  normal LVF, ef 65-70%   CHOLECYSTECTOMY OPEN  1970s   AND APPENDECTOMY   COLONOSCOPY     "I've had a few; last one was 2010" (11/18/2012)   DILATION AND CURETTAGE OF UTERUS  yrs ago   LUNG BIOPSY Right 2009   RE-EXCISION OF BREAST LUMPECTOMY Left 12/10/2012   Procedure: RE-EXCISION OF BREAST LUMPECTOMY;  Surgeon: Emelia Loron, MD;  Location: [facility];  Service: General;  Laterality: Left;   SQUAMOUS CELL CARCINOMA EXCISION     "?abdomen" (11/18/2012)   THYMECTOMY  ~ 1994   "benign growth"   TONSILLECTOMY AND ADENOIDECTOMY  age 39   TOTAL KNEE ARTHROPLASTY Left 03/09/2018   Procedure: LEFT TOTAL KNEE ARTHROPLASTY-ADDUCTOR BLOCK;  Surgeon: Ollen Gross, MD;  Location: [facility];  Service: Orthopedics;  Laterality: Left;    VAGINAL HYSTERECTOMY  1974   retained ovaries   VIDEO ASSISTED THORACOSCOPY (VATS)/WEDGE RESECTION Right 10-16-2007   dr Edwyna Shell  @[facility]    posterior section right upper lobe (minithoracotomy)    FAMILY HISTORY: family history includes Breast cancer  in her mother; Colon cancer (age of onset: 47) in her mother; Heart disease in her father; Hypertension in her mother.  SOCIAL HISTORY:  reports that she has never smoked. She has never used smokeless tobacco. She reports that she does not currently use alcohol. She reports that she does not use drugs.  ALLERGIES: Augmentin [amoxicillin-pot clavulanate] and Codeine  MEDICATIONS:  Current Outpatient Medications  Medication Sig Dispense Refill   acetaminophen (TYLENOL) 500 MG tablet Take 1,000 mg by mouth every 6 (six) hours as needed for pain.     atorvastatin (LIPITOR) 10 MG tablet Take 10 mg by mouth daily.       cycloSPORINE (RESTASIS) 0.05 % ophthalmic emulsion Place 1 drop into both eyes 2 (two) times daily.     gabapentin (NEURONTIN) 300 MG capsule Take 300 mg by mouth at bedtime.     HYDROmorphone (DILAUDID) 2 MG tablet Take 1 tablet (2 mg total) by mouth every 6 (six) hours as needed for moderate pain. 56 tablet 0   losartan (COZAAR) 50 MG tablet Take 50 mg by mouth daily.     methocarbamol (ROBAXIN) 500 MG tablet Take 1 tablet (500 mg total) by mouth every 6 (six) hours as needed for muscle spasms.  40 tablet 0   Polyethyl Glyc-Propyl Glyc PF (SYSTANE PRESERVATIVE FREE) 0.4-0.3 % SOLN Place 1 drop into both eyes 3 (three) times daily as needed (dry eyes).     potassium chloride SA (K-DUR,KLOR-CON) 20 MEQ tablet Take 20 mEq by mouth 2 (two) times daily.     propranolol (INDERAL) 20 MG tablet Take 20 mg by mouth 2 (two) times daily.      rivaroxaban (XARELTO) 10 MG TABS tablet Take 1 tablet (10 mg total) by mouth daily with breakfast for 20 days. Then resume one 81 mg aspirin once a day. 20 tablet 0   triamterene-hydrochlorothiazide (MAXZIDE) 75-50 MG per tablet Take 0.5 tablets by mouth daily.      venlafaxine XR (EFFEXOR-XR) 37.5 MG 24 hr capsule TAKE 1 CAPSULE BY MOUTH TWICE A DAY 180 capsule 3   verapamil (VERELAN PM) 180 MG 24 hr capsule Take 180 mg by mouth every morning.      No  current facility-administered medications for this encounter.   Facility-Administered Medications Ordered in Other Encounters  Medication Dose Route Frequency Provider Last Rate Last Admin   topical emolient (BIAFINE) emulsion   Topical PRN Lurline Hare, MD   Given at 02/03/13 [phone]    REVIEW OF SYSTEMS: As above in HPI.   PHYSICAL EXAM:  vitals were not taken for this visit.   General: Alert and oriented, in no acute distress HEENT: Head is normocephalic. Extraocular movements are intact. Oropharynx is clear. Neck: Neck is supple, no palpable cervical or supraclavicular lymphadenopathy. Heart: Regular in rate and rhythm with no murmurs, rubs, or gallops. Chest: Clear to auscultation bilaterally, with no rhonchi, wheezes, or rales. Abdomen: Soft, nontender, nondistended, with no rigidity or guarding. Midline scar on the abdomen from a cholecystectomy.  Extremities: No cyanosis or edema. Lymphatics: see Neck Exam Skin: No concerning lesions. Musculoskeletal: symmetric strength and muscle tone throughout. Neurologic: Cranial nerves II through XII are grossly intact. No obvious focalities. Speech is fluent. Coordination is intact. Psychiatric: Judgment and insight are intact. Affect is appropriate. Breasts: Seborrheic keratosis present in bilateral breasts. Thymectomy scar along the sternum. Bruise with palpable swelling at the 12 o'clock position at the left breast where her biopsy was obtained. Inverted nipple on the left breast. Axillary scar and 12 o'clock lumpectomy scar on the left side that has healed well. No other palpable masses appreciated in the breasts or axillae.    ECOG = 0  0 - Asymptomatic (Fully active, able to carry on all predisease activities without restriction)  1 - Symptomatic but completely ambulatory (Restricted in physically strenuous activity but ambulatory and able to carry out work of a light or sedentary nature. For example, light housework, office  work)  2 - Symptomatic, <50% in bed during the day (Ambulatory and capable of all self care but unable to carry out any work activities. Up and about more than 50% of waking hours)  3 - Symptomatic, >50% in bed, but not bedbound (Capable of only limited self-care, confined to bed or chair 50% or more of waking hours)  4 - Bedbound (Completely disabled. Cannot carry on any self-care. Totally confined to bed or chair)  5 - Death   Santiago Glad MM, Creech RH, Tormey DC, et al. [phone]). "Toxicity and response criteria of the [facility]". Am. Evlyn Clines. Oncol. 5 (6): 649-55   LABORATORY DATA:  Lab Results  Component Value Date   WBC 5.1 01/08/2023   HGB 12.6 01/08/2023   HCT 38.1 01/08/2023  MCV 95.7 01/08/2023   PLT 170 01/08/2023   CMP     Component Value Date/Time   NA 142 01/08/2023 1223   NA 146 (H) 11/20/2016 1422   K 4.0 01/08/2023 1223   K 4.5 11/20/2016 1422   CL 106 01/08/2023 1223   CO2 30 01/08/2023 1223   CO2 30 (H) 11/20/2016 1422   GLUCOSE 97 01/08/2023 1223   GLUCOSE 103 11/20/2016 1422   BUN 22 01/08/2023 1223   BUN 18.1 11/20/2016 1422   CREATININE 1.17 (H) 01/08/2023 1223   CREATININE 1.0 11/20/2016 1422   CALCIUM 9.8 01/08/2023 1223   CALCIUM 10.5 (H) 11/20/2016 1422   PROT 6.9 01/08/2023 1223   PROT 7.5 11/20/2016 1422   ALBUMIN 4.2 01/08/2023 1223   ALBUMIN 4.1 11/20/2016 1422   AST 17 01/08/2023 1223   AST 23 11/20/2016 1422   ALT 14 01/08/2023 1223   ALT 19 11/20/2016 1422   ALKPHOS 78 01/08/2023 1223   ALKPHOS 109 11/20/2016 1422   BILITOT 0.6 01/08/2023 1223   BILITOT 0.41 11/20/2016 1422   GFRNONAA 46 (L) 01/08/2023 1223   GFRAA >60 03/10/2018 0445         RADIOGRAPHY: [place] RT BREAST BX W LOC DEV 1ST LESION IMG BX SPEC US GUIDE  Addendum Date: 01/01/2023   ADDENDUM REPORT: 01/01/2023 17:36 ADDENDUM: Pathology revealed GRADE II INVASIVE MAMMARY CARCINOMA of the RIGHT breast, 12 o'clock, 5 cmfn, two adjacent masses, (ribbon  clip). This was found to be concordant by Dr. Romona Curls. Pathology results were discussed with the patient by telephone. The patient reported doing well after the biopsy with tenderness at the site. Post biopsy instructions and care were reviewed and questions were answered. The patient was encouraged to call [facility] for any additional concerns. My direct phone number was provided. The patient was referred to [facility] on January 08, 2023. Pathology results reported by Rene Kocher, RN on 01/01/2023. Electronically Signed   By: Romona Curls M.D.   On: 01/01/2023 17:36   Result Date: 01/01/2023 CLINICAL DATA:  Here for ultrasound-guided biopsy of two adjacent masses in the right breast at 12 o'clock 5 cm from the nipple. EXAM: ULTRASOUND GUIDED RIGHT BREAST CORE NEEDLE BIOPSY COMPARISON:  Previous exam(s). PROCEDURE: I met with the patient and we discussed the procedure of ultrasound-guided biopsy, including benefits and alternatives. We discussed the high likelihood of a successful procedure. We discussed the risks of the procedure, including infection, bleeding, tissue injury, clip migration, and inadequate sampling. Informed written consent was given. The usual time-out protocol was performed immediately prior to the procedure. Lesion quadrant: Upper outer quadrant Using sterile technique and 1% Lidocaine as local anesthetic, under direct ultrasound visualization, a 12 gauge spring-loaded device was used to perform biopsy of an irregular hypoechoic mass at 12 o'clock 5 cm from the nipple using a lateral approach. At the conclusion of the procedure a ribbon shaped tissue marker clip was deployed into the biopsy cavity. Follow up 2 view mammogram was performed and dictated separately. IMPRESSION: Ultrasound guided biopsy of the right breast. No apparent complications. Electronically Signed: By: Romona Curls M.D. On: 12/31/2022 11:45   MM CLIP PLACEMENT RIGHT  Result Date: 12/31/2022 CLINICAL DATA:  Status post ultrasound-guided biopsy of a mass in the right breast. EXAM: 3D DIAGNOSTIC RIGHT MAMMOGRAM POST ULTRASOUND BIOPSY COMPARISON:  Previous exam(s). FINDINGS: 3D Mammographic images were obtained following ultrasound  guided biopsy of a mass in the right breast at 12 o'clock 5 cm from the nipple. The biopsy marking clip is in expected position at the site of biopsy. IMPRESSION: Appropriate positioning of the ribbon shaped biopsy marking clip at the site of biopsy in the right breast. Final Assessment: Post Procedure Mammograms for Marker Placement BI-RADS CATEGORY  90M: Post-Procedure Mammogram for Marker Placement Electronically Signed   By: Romona Curls M.D.   On: 12/31/2022 11:41   MM 3D DIAGNOSTIC MAMMOGRAM BILATERAL BREAST  Result Date: 12/30/2022 CLINICAL DATA:  82 year old female presenting for evaluation of pain in her left axilla. She has history of a left breast lumpectomy in 2014. EXAM: DIGITAL DIAGNOSTIC BILATERAL MAMMOGRAM WITH TOMOSYNTHESIS AND CAD; [place] AXILLARY LEFT; ULTRASOUND RIGHT BREAST LIMITED TECHNIQUE: Bilateral digital diagnostic mammography and breast tomosynthesis was performed. The images were evaluated with computer-aided detection. ; Targeted ultrasound examination of the left axilla was performed.; Targeted ultrasound examination of the right breast was performed COMPARISON:  Previous exam(s). ACR Breast Density Category c: The breasts are heterogeneously dense, which may obscure small masses. FINDINGS: A spot compression tomosynthesis image over the left axilla has been performed demonstrating no suspicious mammographic findings. In the superior posterior right breast, there is a new oval mass measuring 8 mm. No other suspicious calcifications, masses or areas of distortion are seen in the bilateral breasts. Ultrasound of the right breast at 12 o'clock, 5 cm from the nipple  demonstrates 2 adjacent hypoechoic masses that are nearly abutting, together measuring 1.1 cm. The larger mass alone measures 0.7 x 0.6 x 0.7 cm. Ultrasound of the right axilla demonstrates multiple normal-appearing lymph nodes. Ultrasound of the left axilla demonstrates no suspicious abnormal appearing lymph nodes. No suspicious masses are identified. IMPRESSION: 1. There are 2 abutting suspicious masses in the right breast at 12 o'clock, together measuring 1.1 cm. 2.  No evidence of right axillary lymphadenopathy. 3. No suspicious findings in the left axilla to correspond with the patient's pain. 4.  No evidence of left breast malignancy. RECOMMENDATION: 1. Ultrasound guided biopsy is recommended for the abutting masses in the right breast at 12 o'clock. This may be performed together as 1 biopsy. We will schedule the patient for the procedure prior to her departure from breast imaging today. 2. Clinical follow-up recommended for the tender area of concern in the left axilla. Any further workup should be based on clinical grounds. I have discussed the findings and recommendations with the patient. If applicable, a reminder letter will be sent to the patient regarding the next appointment. BI-RADS CATEGORY  5: Highly suggestive of malignancy. Electronically Signed   By: Frederico Hamman M.D.   On: 12/30/2022 16:19   [place] LIMITED ULTRASOUND INCLUDING AXILLA RIGHT BREAST  Result Date: 12/30/2022 CLINICAL DATA:  82 year old female presenting for evaluation of pain in her left axilla. She has history of a left breast lumpectomy in 2014. EXAM: DIGITAL DIAGNOSTIC BILATERAL MAMMOGRAM WITH TOMOSYNTHESIS AND CAD; [place] AXILLARY LEFT; ULTRASOUND RIGHT BREAST LIMITED TECHNIQUE: Bilateral digital diagnostic mammography and breast tomosynthesis was performed. The images were evaluated with computer-aided detection. ; Targeted ultrasound examination of the left axilla was performed.; Targeted ultrasound examination of the right  breast was performed COMPARISON:  Previous exam(s). ACR Breast Density Category c: The breasts are heterogeneously dense, which may obscure small masses. FINDINGS: A spot compression tomosynthesis image over the left axilla has been performed demonstrating no suspicious mammographic findings. In the superior posterior right breast, there is a new oval mass  measuring 8 mm. No other suspicious calcifications, masses or areas of distortion are seen in the bilateral breasts. Ultrasound of the right breast at 12 o'clock, 5 cm from the nipple demonstrates 2 adjacent hypoechoic masses that are nearly abutting, together measuring 1.1 cm. The larger mass alone measures 0.7 x 0.6 x 0.7 cm. Ultrasound of the right axilla demonstrates multiple normal-appearing lymph nodes. Ultrasound of the left axilla demonstrates no suspicious abnormal appearing lymph nodes. No suspicious masses are identified. IMPRESSION: 1. There are 2 abutting suspicious masses in the right breast at 12 o'clock, together measuring 1.1 cm. 2.  No evidence of right axillary lymphadenopathy. 3. No suspicious findings in the left axilla to correspond with the patient's pain. 4.  No evidence of left breast malignancy. RECOMMENDATION: 1. Ultrasound guided biopsy is recommended for the abutting masses in the right breast at 12 o'clock. This may be performed together as 1 biopsy. We will schedule the patient for the procedure prior to her departure from breast imaging today. 2. Clinical follow-up recommended for the tender area of concern in the left axilla. Any further workup should be based on clinical grounds. I have discussed the findings and recommendations with the patient. If applicable, a reminder letter will be sent to the patient regarding the next appointment. BI-RADS CATEGORY  5: Highly suggestive of malignancy. Electronically Signed   By: Frederico Hamman M.D.   On: 12/30/2022 16:19   [place] AXILLA LEFT  Result Date: 12/30/2022 CLINICAL DATA:   82 year old female presenting for evaluation of pain in her left axilla. She has history of a left breast lumpectomy in 2014. EXAM: DIGITAL DIAGNOSTIC BILATERAL MAMMOGRAM WITH TOMOSYNTHESIS AND CAD; [place] AXILLARY LEFT; ULTRASOUND RIGHT BREAST LIMITED TECHNIQUE: Bilateral digital diagnostic mammography and breast tomosynthesis was performed. The images were evaluated with computer-aided detection. ; Targeted ultrasound examination of the left axilla was performed.; Targeted ultrasound examination of the right breast was performed COMPARISON:  Previous exam(s). ACR Breast Density Category c: The breasts are heterogeneously dense, which may obscure small masses. FINDINGS: A spot compression tomosynthesis image over the left axilla has been performed demonstrating no suspicious mammographic findings. In the superior posterior right breast, there is a new oval mass measuring 8 mm. No other suspicious calcifications, masses or areas of distortion are seen in the bilateral breasts. Ultrasound of the right breast at 12 o'clock, 5 cm from the nipple demonstrates 2 adjacent hypoechoic masses that are nearly abutting, together measuring 1.1 cm. The larger mass alone measures 0.7 x 0.6 x 0.7 cm. Ultrasound of the right axilla demonstrates multiple normal-appearing lymph nodes. Ultrasound of the left axilla demonstrates no suspicious abnormal appearing lymph nodes. No suspicious masses are identified. IMPRESSION: 1. There are 2 abutting suspicious masses in the right breast at 12 o'clock, together measuring 1.1 cm. 2.  No evidence of right axillary lymphadenopathy. 3. No suspicious findings in the left axilla to correspond with the patient's pain. 4.  No evidence of left breast malignancy. RECOMMENDATION: 1. Ultrasound guided biopsy is recommended for the abutting masses in the right breast at 12 o'clock. This may be performed together as 1 biopsy. We will schedule the patient for the procedure prior to her departure from breast  imaging today. 2. Clinical follow-up recommended for the tender area of concern in the left axilla. Any further workup should be based on clinical grounds. I have discussed the findings and recommendations with the patient. If applicable, a reminder letter will be sent to the patient regarding the next appointment.  BI-RADS CATEGORY  5: Highly suggestive of malignancy. Electronically Signed   By: Frederico Hamman M.D.   On: 12/30/2022 16:19      IMPRESSION/PLAN: Stage 1A Right Breast UOQ, Invasive Ductal Carcinoma, ER+ / PR+ / Her2-, Grade 2   For the patient's early stage favorable risk breast cancer, we had a thorough discussion about her options for adjuvant therapy. One option would be antiestrogen therapy as discussed with medical oncology. She would take a pill for approximately 5 years. The more aggressive option would be to pursue both modalities.   Of note, I discussed the data from the [employer] al trial in the [place] Journal of Medicine. She understands that tamoxifen compared to radiation plus tamoxifen demonstrated no survival benefit among the women in this study. The women were 70 years or older with stage I estrogen receptor positive breast cancer. Based on this study, I told the patient that her overall life expectancy should not be affected by adding radiotherapy to antiestrogen medication. She understands that the main benefit of  adding radiotherapy to anti estrogen therapy would be a very small but measurable local control benefit (risk of local recurrence to be lowered from ~9% --> ~2% over a decade).  We discussed the fact that radiotherapy only provides a local control benefit while anti-estrogen pills provide systemic coverage. That being said, the risk of systemic failure is relatively low with her type of breast cancer.   We discussed the risks benefits and side effects of radiotherapy. She understands that the side effects would likely include some skin irritation and fatigue  during the weeks of radiation. There is a risk of late effects which include but are not necessarily limited to cosmetic changes and rare lung toxicity. I would anticipate delivering approximately 1-4 weeks of radiotherapy    After a thorough discussion of standard hypofractionation over 3-4 weeks, we discussed 1 week of ultra hypofractionated radiation therapy. I discussed that this approach is a bit less standard than longer regimens.  The Fast Forward trial (1 week) method has shown a slight increase in normal tissue side effects over the 3 week hypofractionation standard at 5 years of follow-up. This includes effects like induration, and edema.  However, for elderly patients that are keen on the convenience of minimizing the number of procedures/visits to the cancer center, this is a nevertheless a reasonable approach to consider and the vast majority of patients do very well. There is also an option of RT once weekly for 5 weeks that we discussed.   She is enthusiastic about proceeding with radiation therapy. We will discuss which radiation regimen she will proceed with at a follow-up appointment after surgery. She knows to call with any questions or concerns in the meantime.    We spoke about other general acute effects of breast radiation including skin irritation and fatigue as well as breast fibrosis, induration, and /or swelling long term, and much less common late effects including internal organ injury or irritation. We spoke about the latest technology that is used to minimize the risk of late effects for patients undergoing radiotherapy to the breast or chest wall. No guarantees of treatment were given. The patient is enthusiastic about proceeding with treatment.  I look forward to participating in the patient's care.  I will await her referral back to me for postoperative follow-up and eventual CT simulation/treatment planning.       On date of service, in total, I spent 60 minutes on this  encounter.  Patient was seen in person.   __________________________________________   Joyice Faster, PA-C    Lonie Peak, MD  This document serves as a record of services personally performed by Lonie Peak, MD. It was created on her behalf by Neena Rhymes, a trained medical scribe. The creation of this record is based on the scribe's personal observations and the provider's statements to them. This document has been checked and approved by the attending provider.

## 2023-01-08 ENCOUNTER — Ambulatory Visit
Admission: RE | Admit: 2023-01-08 | Discharge: 2023-01-08 | Disposition: A | Discharge status: Home / Self Care | Payer: Medicare Other | Source: Ambulatory Visit | Attending: Radiation Oncology | Admitting: Radiation Oncology

## 2023-01-08 ENCOUNTER — Other Ambulatory Visit: Payer: Self-pay | Admitting: Surgery

## 2023-01-08 ENCOUNTER — Inpatient Hospital Stay: Payer: Medicare Other | Attending: Hematology and Oncology

## 2023-01-08 ENCOUNTER — Ambulatory Visit: Payer: Medicare Other | Admitting: Physical Therapy

## 2023-01-08 ENCOUNTER — Inpatient Hospital Stay (HOSPITAL_BASED_OUTPATIENT_CLINIC_OR_DEPARTMENT_OTHER): Payer: Medicare Other | Admitting: Hematology and Oncology

## 2023-01-08 ENCOUNTER — Encounter: Payer: Self-pay | Admitting: *Deleted

## 2023-01-08 ENCOUNTER — Inpatient Hospital Stay (HOSPITAL_BASED_OUTPATIENT_CLINIC_OR_DEPARTMENT_OTHER): Payer: Medicare Other | Admitting: Genetic Counselor

## 2023-01-08 ENCOUNTER — Encounter: Payer: Self-pay | Admitting: General Practice

## 2023-01-08 VITALS — BP 131/56 | HR 65 | Temp 97.9°F | Resp 18 | Ht 61.0 in | Wt 161.0 lb

## 2023-01-08 DIAGNOSIS — Z853 Personal history of malignant neoplasm of breast: Secondary | ICD-10-CM | POA: Diagnosis not present

## 2023-01-08 DIAGNOSIS — C50212 Malignant neoplasm of upper-inner quadrant of left female breast: Secondary | ICD-10-CM

## 2023-01-08 DIAGNOSIS — Z800 Family history of malignant neoplasm of digestive organs: Secondary | ICD-10-CM | POA: Insufficient documentation

## 2023-01-08 DIAGNOSIS — Z803 Family history of malignant neoplasm of breast: Secondary | ICD-10-CM

## 2023-01-08 DIAGNOSIS — C50411 Malignant neoplasm of upper-outer quadrant of right female breast: Secondary | ICD-10-CM

## 2023-01-08 DIAGNOSIS — Z170 Estrogen receptor positive status [ER+]: Secondary | ICD-10-CM

## 2023-01-08 DIAGNOSIS — Z85118 Personal history of other malignant neoplasm of bronchus and lung: Secondary | ICD-10-CM

## 2023-01-08 DIAGNOSIS — C50911 Malignant neoplasm of unspecified site of right female breast: Secondary | ICD-10-CM | POA: Diagnosis not present

## 2023-01-08 LAB — CBC WITH DIFFERENTIAL (CANCER CENTER ONLY)
Abs Immature Granulocytes: 0.01 10*3/uL (ref 0.00–0.07)
Basophils Absolute: 0 10*3/uL (ref 0.0–0.1)
Basophils Relative: 0 %
Eosinophils Absolute: 0.1 10*3/uL (ref 0.0–0.5)
Eosinophils Relative: 2 %
HCT: 38.1 % (ref 36.0–46.0)
Hemoglobin: 12.6 g/dL (ref 12.0–15.0)
Immature Granulocytes: 0 %
Lymphocytes Relative: 25 %
Lymphs Abs: 1.3 10*3/uL (ref 0.7–4.0)
MCH: 31.7 pg (ref 26.0–34.0)
MCHC: 33.1 g/dL (ref 30.0–36.0)
MCV: 95.7 fL (ref 80.0–100.0)
Monocytes Absolute: 0.5 10*3/uL (ref 0.1–1.0)
Monocytes Relative: 9 %
Neutro Abs: 3.3 10*3/uL (ref 1.7–7.7)
Neutrophils Relative %: 64 %
Platelet Count: 170 10*3/uL (ref 150–400)
RBC: 3.98 MIL/uL (ref 3.87–5.11)
RDW: 15.2 % (ref 11.5–15.5)
WBC Count: 5.1 10*3/uL (ref 4.0–10.5)
nRBC: 0 % (ref 0.0–0.2)

## 2023-01-08 LAB — GENETIC SCREENING ORDER

## 2023-01-08 LAB — CMP (CANCER CENTER ONLY)
ALT: 14 U/L (ref 0–44)
AST: 17 U/L (ref 15–41)
Albumin: 4.2 g/dL (ref 3.5–5.0)
Alkaline Phosphatase: 78 U/L (ref 38–126)
Anion gap: 6 (ref 5–15)
BUN: 22 mg/dL (ref 8–23)
CO2: 30 mmol/L (ref 22–32)
Calcium: 9.8 mg/dL (ref 8.9–10.3)
Chloride: 106 mmol/L (ref 98–111)
Creatinine: 1.17 mg/dL — ABNORMAL HIGH (ref 0.44–1.00)
GFR, Estimated: 46 mL/min — ABNORMAL LOW (ref >60)
Glucose, Bld: 97 mg/dL (ref 70–99)
Potassium: 4 mmol/L (ref 3.5–5.1)
Sodium: 142 mmol/L (ref 135–145)
Total Bilirubin: 0.6 mg/dL (ref 0.3–1.2)
Total Protein: 6.9 g/dL (ref 6.5–8.1)

## 2023-01-08 NOTE — Progress Notes (Signed)
[facility] Spiritual Care Note  Met with Anthonella and her husband Ray in [facility] to introduce Support Center team/resources.  she completed SDOH screening; results follow below.    SDOH Screenings   Food Insecurity: No Food Insecurity (01/08/2023)  Housing: Low Risk  (01/08/2023)  Transportation Needs: No Transportation Needs (01/08/2023)  Utilities: Not At Risk (01/08/2023)  Depression (PHQ2-9): Low Risk  (01/08/2023)  Tobacco Use: Low Risk  (12/30/2022)    Chaplain and patient discussed common feelings and emotions when being diagnosed with cancer, and the importance of support during treatment.  Chaplain informed patient of the support team and support services at [facility].  Chaplain provided contact information and encouraged patient to call with any questions or concerns.  Follow up needed: Yes.  Ms Fraiser welcomes a follow-up call in ca two weeks. Will add her to programming mailing list per her request, as well.   [address] [place], [place], [facility] Pager [phone] Voicemail [phone]

## 2023-01-08 NOTE — Progress Notes (Signed)
[facility] CONSULT NOTE  Patient Care Team: Garlan Fillers, MD as PCP - General (Internal Medicine) Pershing Proud, RN as Oncology Nurse Navigator Donnelly Angelica, RN as Oncology Nurse Navigator  CHIEF COMPLAINTS/PURPOSE OF CONSULTATION:  Newly diagnosed breast cancer  HISTORY OF PRESENTING ILLNESS:  Discussed the use of AI scribe software for clinical note transcription with the patient, who gave verbal consent to proceed.  History of Present Illness   The patient, with a history of left-sided breast cancer treated with lumpectomy, radiation, and a 5-year course of an unknown antiestrogen pill, presents with a new diagnosis of right-sided breast cancer. The new cancer was detected through a routine mammogram, revealing two close areas measuring 1.1 cm in total. The patient reports no enlargement of lymph nodes. The cancer cells are positive for both estrogen and progesterone receptors, indicating she feeds on these hormones to grow. The cells are negative for the HER2 receptor, suggesting a less aggressive cancer. The cancer is graded as 2, indicating a moderate level of abnormality in the cells. The KI67 marker, indicating cell growth rate, is at 20%, considered low. The overall stage of the cancer is Stage 1A, the earliest stage.         I reviewed her records extensively and collaborated the history with the patient.  SUMMARY OF ONCOLOGIC HISTORY: Oncology History  Malignant neoplasm of upper-inner quadrant of left breast in female, estrogen receptor positive (HCC)  11/04/2012 Cancer Staging   Staging form: Breast, AJCC 8th Edition - Clinical: Stage IA (cT1c, cN0, cM0, G2, ER+, PR+, HER2-) - Signed by Serena Croissant, MD on 01/08/2023 Stage prefix: Initial diagnosis Histologic grading system: 3 grade system   12/23/2012 Initial Diagnosis   Malignant neoplasm of upper-inner quadrant of left breast in female, estrogen receptor positive (HCC)   Malignant neoplasm of  upper-outer quadrant of right breast in female, estrogen receptor positive (HCC)  12/31/2022 Initial Diagnosis   Screening mammogram detected adjacent right breast masses (12 o'clock position) 1.1 cm, axilla negative, biopsy: Grade 2 IDC ER 100%, PR 30%, HER2 1+ negative, Ki-67 20% (Left breast cancer 2014: Treated with surgery, radiation)   01/08/2023 Cancer Staging   Staging form: Breast, AJCC 8th Edition - Clinical: Stage IA (cT1c, cN0, cM0, G2, ER+, PR+, HER2-) - Signed by Serena Croissant, MD on 01/08/2023 Histologic grading system: 3 grade system      MEDICAL HISTORY:  Past Medical History:  Diagnosis Date   Anxiety    Asymptomatic PVCs    "occasional"   CKD (chronic kidney disease), stage III (HCC)    Diverticulosis    Hepatitis B 1970's   "retired Engineer, civil (consulting),  needle stick, 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,  no treatment"   History of basal cell carcinoma (BCC) excision    face ,  right lower extremity   History of colon polyps    History of DVT of lower extremity    RLE  yrs ago   History of external beam radiation therapy 01-13-2013  to 02-09-2013   left breast   History of squamous cell carcinoma excision    abdomen   Hyperlipidemia    Hypertension    Insomnia    infreqently takes Lunesta   Lung cancer [facility]) 2009   10-16-2007  s/p  right VATS with wedge resection right upper lobe posterior section/  low grade neuroendocrine tumor and noncaseating granuloma (last Chest CT 08-14-2010  no recurrence with stable bilateral pulm. nodules)  per pt no further treatment , followed  by pcp   Malignant neoplasm of upper-inner quadrant of left breast in female, estrogen receptor positive [facility]) ONCOLOGIST-- DR Darnelle Catalan   dx 07/ 2014-- Invasive Ductal carcinoma, Stage IIA, Grade 1 (pT2pN0)-- 11-18-2012  s/p  left breast lumpectomy with sln dissection re-excsion left breast 12-10-2012,  completed radiation 02-09-2013, started antiestrogen therapy 02-24-2013   Myocardial bridge    per cardiac cath  03-26-2010  LAD tortousity with intramyocardial bridge   Osteoarthritis    ,  knees, thumbs   Osteopenia    Personal history of radiation therapy 2014   left breast cancer   PONV (postoperative nausea and vomiting)    Scoliosis    SUI (stress urinary incontinence, female)    Wears glasses    Wears glasses     SURGICAL HISTORY: Past Surgical History:  Procedure Laterality Date   BASAL CELL CARCINOMA EXCISION     "right face; RLE" (11/18/2012)   BREAST BIOPSY Right ~ 1962   "benign"    BREAST BIOPSY Right 10/2012   "benign cyst"    BREAST BIOPSY Left 10/2012   BREAST BIOPSY Right 12/31/2022   [place] RT BREAST BX W LOC DEV 1ST LESION IMG BX SPEC US GUIDE 12/31/2022 [facility] MAMMOGRAPHY   BREAST EXCISIONAL BIOPSY Right    BREAST LUMPECTOMY Left 2014   BREAST LUMPECTOMY WITH NEEDLE LOCALIZATION AND AXILLARY SENTINEL LYMPH NODE BX Left 11/18/2012   Procedure: LEFT BREAST LUMPECTOMY WITH NEEDLE LOCALIZATION AND AXILLARY SENTINEL LYMPH NODE BX;  Surgeon: Emelia Loron, MD;  Location: [facility] OR;  Service: General;  Laterality: Left;   CARDIAC CATHETERIZATION  03-26-2010   dr Jacinto Halim   normal coronaries,  LAD tortousity with intramyocardial bridge,  normal LVF, ef 65-70%   CHOLECYSTECTOMY OPEN  1970s   AND APPENDECTOMY   COLONOSCOPY     "I've had a few; last one was 2010" (11/18/2012)   DILATION AND CURETTAGE OF UTERUS  yrs ago   LUNG BIOPSY Right 2009   RE-EXCISION OF BREAST LUMPECTOMY Left 12/10/2012   Procedure: RE-EXCISION OF BREAST LUMPECTOMY;  Surgeon: Emelia Loron, MD;  Location:  SURGERY CENTER;  Service: General;  Laterality: Left;   SQUAMOUS CELL CARCINOMA EXCISION     "?abdomen" (11/18/2012)   THYMECTOMY  ~ 1994   "benign growth"   TONSILLECTOMY AND ADENOIDECTOMY  age 47   TOTAL KNEE ARTHROPLASTY Left 03/09/2018   Procedure: LEFT TOTAL KNEE ARTHROPLASTY-ADDUCTOR BLOCK;  Surgeon: Ollen Gross, MD;  Location: [facility]S;  Service: Orthopedics;  Laterality: Left;     VAGINAL HYSTERECTOMY  1974   retained ovaries   VIDEO ASSISTED THORACOSCOPY (VATS)/WEDGE RESECTION Right 10-16-2007   dr Edwyna Shell  @[facility]    posterior section right upper lobe (minithoracotomy)    SOCIAL HISTORY: Social History   Socioeconomic History   Marital status: Married    Spouse name: Not on file   Number of children: Not on file   Years of education: Not on file   Highest education level: Not on file  Occupational History   Not on file  Tobacco Use   Smoking status: Never   Smokeless tobacco: Never  Vaping Use   Vaping status: Never Used  Substance and Sexual Activity   Alcohol use: Not Currently    Alcohol/week: 0.0 standard drinks of alcohol   Drug use: Never   Sexual activity: Yes    Birth control/protection: Post-menopausal, Surgical    Comment: menarche age 58, menopause early 4's, P2, 1st live birth age 38, HRT x 8-10 yrs  Other  Topics Concern   Not on file  Social History Narrative   Not on file   Social Determinants of Health   Financial Resource Strain: Not on file  Food Insecurity: Not on file  Transportation Needs: Not on file  Physical Activity: Not on file  Stress: Not on file  Social Connections: Not on file  Intimate Partner Violence: Not on file    FAMILY HISTORY: Family History  Problem Relation Age of Onset   Hypertension Mother    Colon cancer Mother 57   Breast cancer Mother        Age late 5's   Heart disease Father     ALLERGIES:  is allergic to augmentin [amoxicillin-pot clavulanate] and codeine.  MEDICATIONS:  Current Outpatient Medications  Medication Sig Dispense Refill   acetaminophen (TYLENOL) 500 MG tablet Take 1,000 mg by mouth every 6 (six) hours as needed for pain.     atorvastatin (LIPITOR) 10 MG tablet Take 10 mg by mouth daily.       cycloSPORINE (RESTASIS) 0.05 % ophthalmic emulsion Place 1 drop into both eyes 2 (two) times daily.     gabapentin (NEURONTIN) 300 MG capsule Take 300 mg by mouth at bedtime.      HYDROmorphone (DILAUDID) 2 MG tablet Take 1 tablet (2 mg total) by mouth every 6 (six) hours as needed for moderate pain. 56 tablet 0   losartan (COZAAR) 50 MG tablet Take 50 mg by mouth daily.     methocarbamol (ROBAXIN) 500 MG tablet Take 1 tablet (500 mg total) by mouth every 6 (six) hours as needed for muscle spasms. 40 tablet 0   Polyethyl Glyc-Propyl Glyc PF (SYSTANE PRESERVATIVE FREE) 0.4-0.3 % SOLN Place 1 drop into both eyes 3 (three) times daily as needed (dry eyes).     potassium chloride SA (K-DUR,KLOR-CON) 20 MEQ tablet Take 20 mEq by mouth 2 (two) times daily.     propranolol (INDERAL) 20 MG tablet Take 20 mg by mouth 2 (two) times daily.      rivaroxaban (XARELTO) 10 MG TABS tablet Take 1 tablet (10 mg total) by mouth daily with breakfast for 20 days. Then resume one 81 mg aspirin once a day. 20 tablet 0   triamterene-hydrochlorothiazide (MAXZIDE) 75-50 MG per tablet Take 0.5 tablets by mouth daily.      venlafaxine XR (EFFEXOR-XR) 37.5 MG 24 hr capsule TAKE 1 CAPSULE BY MOUTH TWICE A DAY 180 capsule 3   verapamil (VERELAN PM) 180 MG 24 hr capsule Take 180 mg by mouth every morning.      No current facility-administered medications for this visit.   Facility-Administered Medications Ordered in Other Visits  Medication Dose Route Frequency Provider Last Rate Last Admin   topical emolient (BIAFINE) emulsion   Topical PRN Lurline Hare, MD   Given at 02/03/13 [phone]    REVIEW OF SYSTEMS:   Constitutional: Denies fevers, chills or abnormal night sweats   All other systems were reviewed with the patient and are negative.  PHYSICAL EXAMINATION: ECOG PERFORMANCE STATUS: 1 - Symptomatic but completely ambulatory  Vitals:   01/08/23 1248  BP: (!) 131/56  Pulse: 65  Resp: 18  Temp: 97.9 F (36.6 C)  SpO2: 96%   Filed Weights   01/08/23 1248  Weight: 161 lb (73 kg)    GENERAL:alert, no distress and comfortable    LABORATORY DATA:  I have reviewed the data as  listed Lab Results  Component Value Date   WBC 5.1 01/08/2023  HGB 12.6 01/08/2023   HCT 38.1 01/08/2023   MCV 95.7 01/08/2023   PLT 170 01/08/2023   Lab Results  Component Value Date   NA 142 01/08/2023   K 4.0 01/08/2023   CL 106 01/08/2023   CO2 30 01/08/2023    RADIOGRAPHIC STUDIES: I have personally reviewed the radiological reports and agreed with the findings in the report.  ASSESSMENT AND PLAN:  Malignant neoplasm of upper-outer quadrant of right breast in female, estrogen receptor positive (HCC) 12/31/2022: Screening mammogram detected adjacent right breast masses (12 o'clock position) 1.1 cm, axilla negative, biopsy: Grade 2 IDC ER 100%, PR 30%, HER2 1+ negative, Ki-67 20% (Left breast cancer 2014: Treated with surgery, radiation)  Pathology and radiology counseling: Discussed with the patient, the details of pathology including the type of breast cancer,the clinical staging, the significance of ER, PR and HER-2/neu receptors and the implications for treatment. After reviewing the pathology in detail, we proceeded to discuss the different treatment options between surgery, radiation, chemotherapy, antiestrogen therapies.  Treatment plan: Breast conserving surgery with sentinel lymph node biopsy Plus or minus radiation (standard versus hypofractionation) Antiestrogen therapy with anastrozole 1 mg daily x 5 years  Return to clinic after surgery to discuss final pathology report Assessment and Plan    Right-sided Invasive Ductal Carcinoma New diagnosis of right-sided breast cancer, 10 years after left-sided breast cancer. The tumor is 1.1 cm, grade 2, with ER/PR positive and HER2 negative receptors. KI67 is 20%, indicating a low growth rate. No lymph node involvement detected. Stage 1A. -Plan for lumpectomy surgery. -Consider radiation therapy post-surgery, potentially using hypofractionation. -Consider Anastrozole for up to 5 years post-surgery.  History of Left-sided  Breast Cancer Treated 10 years ago with lumpectomy, radiation, and 5 years of an unknown anti-estrogen pill (possibly Anastrozole or Letrozole). -No current treatment plan, as this is a past medical history.  Follow-up 10 days post-surgery to discuss next steps.         All questions were answered. The patient knows to call the clinic with any problems, questions or concerns.    Tamsen Meek, MD 01/08/23

## 2023-01-08 NOTE — Assessment & Plan Note (Signed)
12/31/2022: Screening mammogram detected adjacent right breast masses (12 o'clock position) 1.1 cm, axilla negative, biopsy: Grade 2 IDC ER 100%, PR 30%, HER2 1+ negative, Ki-67 20% (Left breast cancer 2014: Treated with surgery, radiation)  Pathology and radiology counseling: Discussed with the patient, the details of pathology including the type of breast cancer,the clinical staging, the significance of ER, PR and HER-2/neu receptors and the implications for treatment. After reviewing the pathology in detail, we proceeded to discuss the different treatment options between surgery, radiation, chemotherapy, antiestrogen therapies.  Treatment plan: Breast conserving surgery with sentinel lymph node biopsy Plus or minus radiation Antiestrogen therapy with anastrozole 1 mg daily x 5 years  Return to clinic after surgery to discuss final pathology report

## 2023-01-08 NOTE — Assessment & Plan Note (Deleted)
12/31/2022: Screening mammogram detected adjacent right breast masses (12 o'clock position) 1.1 cm, axilla negative, biopsy: Grade 2 IDC ER 100%, PR 30%, HER2 1+ negative, Ki-67 20% (Left breast cancer 2014: Treated with surgery, radiation)  Pathology and radiology counseling: Discussed with the patient, the details of pathology including the type of breast cancer,the clinical staging, the significance of ER, PR and HER-2/neu receptors and the implications for treatment. After reviewing the pathology in detail, we proceeded to discuss the different treatment options between surgery, radiation, chemotherapy, antiestrogen therapies.  Treatment plan: Breast conserving surgery with sentinel lymph node biopsy Plus or minus radiation Antiestrogen therapy with anastrozole 1 mg daily x 5 years  Return to clinic after surgery to discuss final pathology report

## 2023-01-09 ENCOUNTER — Encounter: Payer: Self-pay | Admitting: Radiation Oncology

## 2023-01-09 ENCOUNTER — Encounter: Payer: Self-pay | Admitting: Genetic Counselor

## 2023-01-09 NOTE — Progress Notes (Signed)
REFERRING PROVIDER: Serena Croissant, MD [address] [place],  [place] [address]  PRIMARY PROVIDER:  Garlan Fillers, MD  PRIMARY REASON FOR VISIT:  1. Malignant neoplasm of upper-outer quadrant of right breast in female, estrogen receptor positive (HCC)   2. Personal history of breast cancer   3. Personal history of lung cancer   4. Family history of breast cancer     HISTORY OF PRESENT ILLNESS:   Teresa Chapman, a 82 y.o. female, was seen for a [place] cancer genetics consultation at the request of Dr. Pamelia Hoit due to a personal and family history of breast cancer.  Teresa Chapman presents to clinic today to discuss the possibility of a hereditary predisposition to cancer, to discuss genetic testing, and to further clarify her future cancer risks, as well as potential cancer risks for family members.   In September 2024, at the age of 5, Teresa Chapman was diagnosed with invasive ductal carcinoma of the right breast (ER+/PR+/HER2-). The treatment plan is pending.  Teresa Chapman has a history of left invasive ductal carcinoma (ER+/PR+/HER2-), diagnosed in 2014 at the age of 36, s/p lumpectomy, radiation, and anti-estrogens.  In 2009, she was diagnosed with a low grade neuroendocrine tumor of the lung s/p right upper lobe posterior segmentectomy.    CANCER HISTORY:  Oncology History  Malignant neoplasm of upper-inner quadrant of left breast in female, estrogen receptor positive (HCC)  11/04/2012 Cancer Staging   Staging form: Breast, AJCC 8th Edition - Clinical: Stage IA (cT1c, cN0, cM0, G2, ER+, PR+, HER2-) - Signed by Serena Croissant, MD on 01/08/2023 Stage prefix: Initial diagnosis Histologic grading system: 3 grade system   12/23/2012 Initial Diagnosis   Malignant neoplasm of upper-inner quadrant of left breast in female, estrogen receptor positive (HCC)   Malignant neoplasm of upper-outer quadrant of right breast in female, estrogen receptor positive (HCC)  12/31/2022 Initial Diagnosis    Screening mammogram detected adjacent right breast masses (12 o'clock position) 1.1 cm, axilla negative, biopsy: Grade 2 IDC ER 100%, PR 30%, HER2 1+ negative, Ki-67 20% (Left breast cancer 2014: Treated with surgery, radiation)   01/08/2023 Cancer Staging   Staging form: Breast, AJCC 8th Edition - Clinical: Stage IA (cT1c, cN0, cM0, G2, ER+, PR+, HER2-) - Signed by Serena Croissant, MD on 01/08/2023 Histologic grading system: 3 grade system     Past Medical History:  Diagnosis Date   Anxiety    Asymptomatic PVCs    "occasional"   CKD (chronic kidney disease), stage III (HCC)    Diverticulosis    Hepatitis B 1970's   "retired Engineer, civil (consulting),  needle stick, 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,  no treatment"   History of basal cell carcinoma (BCC) excision    face ,  right lower extremity   History of colon polyps    History of DVT of lower extremity    RLE  yrs ago   History of external beam radiation therapy 01-13-2013  to 02-09-2013   left breast   History of squamous cell carcinoma excision    abdomen   Hyperlipidemia    Hypertension    Insomnia    infreqently takes Lunesta   Lung cancer [facility]) 2009   10-16-2007  s/p  right VATS with wedge resection right upper lobe posterior section/  low grade neuroendocrine tumor and noncaseating granuloma (last Chest CT 08-14-2010  no recurrence with stable bilateral pulm. nodules)  per pt no further treatment , followed by pcp   Malignant neoplasm of upper-inner quadrant of left  breast in female, estrogen receptor positive [facility]) ONCOLOGIST-- DR Darnelle Catalan   dx 07/ 2014-- Invasive Ductal carcinoma, Stage IIA, Grade 1 (pT2pN0)-- 11-18-2012  s/p  left breast lumpectomy with sln dissection re-excsion left breast 12-10-2012,  completed radiation 02-09-2013, started antiestrogen therapy 02-24-2013   Myocardial bridge    per cardiac cath 03-26-2010  LAD tortousity with intramyocardial bridge   Osteoarthritis    ,  knees, thumbs   Osteopenia    Personal history of  radiation therapy 2014   left breast cancer   PONV (postoperative nausea and vomiting)    Scoliosis    SUI (stress urinary incontinence, female)    Wears glasses    Wears glasses     Past Surgical History:  Procedure Laterality Date   BASAL CELL CARCINOMA EXCISION     "right face; RLE" (11/18/2012)   BREAST BIOPSY Right ~ 1962   "benign"    BREAST BIOPSY Right 10/2012   "benign cyst"    BREAST BIOPSY Left 10/2012   BREAST BIOPSY Right 12/31/2022   [place] RT BREAST BX W LOC DEV 1ST LESION IMG BX SPEC US GUIDE 12/31/2022 [facility]   BREAST EXCISIONAL BIOPSY Right    BREAST LUMPECTOMY Left 2014   BREAST LUMPECTOMY WITH NEEDLE LOCALIZATION AND AXILLARY SENTINEL LYMPH NODE BX Left 11/18/2012   Procedure: LEFT BREAST LUMPECTOMY WITH NEEDLE LOCALIZATION AND AXILLARY SENTINEL LYMPH NODE BX;  Surgeon: Emelia Loron, MD;  Location: [facility] OR;  Service: General;  Laterality: Left;   CARDIAC CATHETERIZATION  03-26-2010   dr Jacinto Halim   normal coronaries,  LAD tortousity with intramyocardial bridge,  normal LVF, ef 65-70%   CHOLECYSTECTOMY OPEN  1970s   AND APPENDECTOMY   COLONOSCOPY     "I've had a few; last one was 2010" (11/18/2012)   DILATION AND CURETTAGE OF UTERUS  yrs ago   LUNG BIOPSY Right 2009   RE-EXCISION OF BREAST LUMPECTOMY Left 12/10/2012   Procedure: RE-EXCISION OF BREAST LUMPECTOMY;  Surgeon: Emelia Loron, MD;  Location: [facility];  Service: General;  Laterality: Left;   SQUAMOUS CELL CARCINOMA EXCISION     "?abdomen" (11/18/2012)   THYMECTOMY  ~ 1994   "benign growth"   TONSILLECTOMY AND ADENOIDECTOMY  age 52   TOTAL KNEE ARTHROPLASTY Left 03/09/2018   Procedure: LEFT TOTAL KNEE ARTHROPLASTY-ADDUCTOR BLOCK;  Surgeon: Ollen Gross, MD;  Location: [facility];  Service: Orthopedics;  Laterality: Left;    VAGINAL HYSTERECTOMY  1974   retained ovaries   VIDEO ASSISTED THORACOSCOPY (VATS)/WEDGE RESECTION Right 10-16-2007   dr Edwyna Shell  @[facility]    posterior  section right upper lobe (minithoracotomy)    FAMILY HISTORY:  We obtained a detailed, 4-generation family history.  Significant diagnoses are listed below: Family History  Problem Relation Age of Onset   Colon cancer Mother 43   Breast cancer Mother        dx 55s   Breast cancer Maternal Aunt        dx >50   Breast cancer Cousin        x2 mat female cousins; one dx >50 and other dx unk age   Breast cancer Cousin        x2 pat female cousins; dx >50    Teresa Chapman thinks a maternal cousin may have had hereditary cancer genetic testing previously but is unaware of the result. There is no reported Ashkenazi Jewish ancestry. There is no known consanguinity.  GENETIC COUNSELING ASSESSMENT: Teresa Chapman is a 82 y.o. female  with a personal and family history  which is somewhat suggestive of a hereditary cancer syndrome and predisposition to cancer given her history of two breast cancer primaries and the presence of breast cancer in multiple generations of the family. We, therefore, discussed and recommended the following at today's visit.   DISCUSSION: We discussed that 5 - 10% of cancer is hereditary.  Most cases of hereditary breast cancer associated with mutations in BRCA1/2.  There are other genes that can be associated with hereditary breast cancer syndromes.  These include but are not limited to ATM, CHEK2, and PALB2.  We discussed that testing is beneficial for several reasons including knowing how to follow individuals for their cancer risks and understanding if other family members could be at risk for cancer and allowing them to undergo genetic testing.   We reviewed the characteristics, features and inheritance patterns of hereditary cancer syndromes. We also discussed genetic testing, including the appropriate family members to test, the process of testing, insurance coverage and turn-around-time for results. We discussed the implications of a negative, positive, carrier and/or variant of  uncertain significant result. We recommended Teresa Chapman pursue genetic testing for a panel that includes genes associated with breast, neuroendocrine tumors, and other cancers.   The CancerNext-Expanded gene panel offered by [facility] and includes sequencing, rearrangement, and RNA analysis for the following 71 genes:  AIP, ALK, APC, ATM, BAP1, BARD1, BMPR1A, BRCA1, BRCA2, BRIP1, CDC73, CDH1, CDK4, CDKN1B, CDKN2A, CHEK2, DICER1, FH, FLCN, KIF1B, LZTR1,MAX, MEN1, MET, MLH1, MSH2, MSH6, MUTYH, NF1, NF2, NTHL1, PALB2, PHOX2B, PMS2, POT1, PRKAR1A, PTCH1, PTEN, RAD51C,RAD51D, RB1, RET, SDHA, SDHAF2, SDHB, SDHC, SDHD, SMAD4, SMARCA4, SMARCB1, SMARCE1, STK11, SUFU, TMEM127, TP53,TSC1, TSC2 and VHL (sequencing and deletion/duplication); AXIN2, CTNNA1, EGFR, EGLN1, HOXB13, KIT, MITF, MSH3, PDGFRA, POLD1 and POLE (sequencing only); EPCAM and GREM1 (deletion/duplication only).   Based on Teresa Chapman's personal history of two breast cancer primaries, she meets medical criteria for genetic testing.   PLAN: After considering the risks, benefits, and limitations, Teresa Chapman provided informed consent to pursue genetic testing and the blood sample was sent to [facility] for analysis of the CancerNext-Expanded +RNAinsight Panel. Results should be available within approximately 3 weeks' time, at which point they will be disclosed by telephone to Teresa Chapman, as will any additional recommendations warranted by these results. Teresa Chapman will receive a summary of her genetic counseling visit and a copy of her results once available. This information will also be available in Epic.   Teresa Chapman questions were answered to her satisfaction today. Our contact information was provided should additional questions or concerns arise. Thank you for the referral and allowing [place] to share in the care of your patient.   Gotham Raden M. Rennie Plowman, MS, [facility] Genetic Counselor [email or web address] [phone]  The patient was seen  for a total of 16 minutes in face-to-face genetic counseling.  The patient was accompanied by her husband.  Dr. Pamelia Hoit was available to discuss this case as needed.    _______________________________________________________________________ For Office Staff:  Number of people involved in session: 2 Was an Intern/ student involved with case: no

## 2023-01-10 ENCOUNTER — Other Ambulatory Visit: Payer: Self-pay | Admitting: Surgery

## 2023-01-10 DIAGNOSIS — Z853 Personal history of malignant neoplasm of breast: Secondary | ICD-10-CM

## 2023-01-16 ENCOUNTER — Other Ambulatory Visit: Payer: Self-pay | Admitting: *Deleted

## 2023-01-16 DIAGNOSIS — H61002 Unspecified perichondritis of left external ear: Secondary | ICD-10-CM | POA: Diagnosis not present

## 2023-01-16 DIAGNOSIS — C44311 Basal cell carcinoma of skin of nose: Secondary | ICD-10-CM | POA: Diagnosis not present

## 2023-01-16 DIAGNOSIS — Z85828 Personal history of other malignant neoplasm of skin: Secondary | ICD-10-CM | POA: Diagnosis not present

## 2023-01-16 DIAGNOSIS — D485 Neoplasm of uncertain behavior of skin: Secondary | ICD-10-CM | POA: Diagnosis not present

## 2023-01-16 DIAGNOSIS — L821 Other seborrheic keratosis: Secondary | ICD-10-CM | POA: Diagnosis not present

## 2023-01-16 DIAGNOSIS — C44319 Basal cell carcinoma of skin of other parts of face: Secondary | ICD-10-CM | POA: Diagnosis not present

## 2023-01-16 DIAGNOSIS — C50411 Malignant neoplasm of upper-outer quadrant of right female breast: Secondary | ICD-10-CM

## 2023-01-16 NOTE — Pre-Procedure Instructions (Signed)
Surgical Instructions    Your procedure is scheduled on Thursday, October 3rd.  Report to [facility] Main Entrance "A" at 11:00 A.M., then check in with the Admitting office.  Call this number if you have problems the morning of surgery:  [phone]  If you have any questions prior to your surgery date call [phone]: Open Monday-Friday 8am-4pm If you experience any cold or flu symptoms such as cough, fever, chills, shortness of breath, etc. between now and your scheduled surgery, please notify us at the above number.     Remember:  Do not eat after midnight the night before your surgery  You may drink clear liquids until 10:00 a.m. the morning of your surgery.   Clear liquids allowed are: Water, Non-Citrus Juices (without pulp), Carbonated Beverages, Clear Tea, Black Coffee Only (NO MILK, CREAM OR POWDERED CREAMER of any kind), and Gatorade.  Patient Instructions  The night before surgery:  No food after midnight. ONLY clear liquids after midnight  The day of surgery (if you do NOT have diabetes):  Drink ONE (1) Pre-Surgery Clear Ensure by 10:00 a.m. the morning of surgery. Drink in one sitting. Do not sip.  This drink was given to you during your hospital  pre-op appointment visit.  Nothing else to drink after completing the  Pre-Surgery Clear Ensure.         If you have questions, please contact your surgeon's office.     Take these medicines the morning of surgery with A SIP OF WATER  atorvastatin (LIPITOR)  carvedilol (COREG)  gabapentin (NEURONTIN)  cycloSPORINE (RESTASIS)  verapamil (VERELAN PM)  venlafaxine XR (EFFEXOR-XR)  sodium chloride (MURO 128)  Polyethyl Glyc-Propyl Glyc PF (SYSTANE PRESERVATIVE FREE) -if needed  7 days prior to surgery STOP taking any Aspirin (unless otherwise instructed by your surgeon), Aleve, Naproxen, Ibuprofen, Motrin, Advil, Goody's, BC's, all herbal medications, fish oil, and all vitamins.  Follow your surgeon's instructions  on when to stop rivaroxaban (XARELTO). If no instructions were given, please reach out to your prescriber for instructions.                      Do NOT Smoke (Tobacco/Vaping) for 24 hours prior to your procedure.  If you use a CPAP at night, you may bring your mask/headgear for your overnight stay.   Contacts, glasses, piercing's, hearing aid's, dentures or partials may not be worn into surgery, please bring cases for these belongings.    For patients admitted to the hospital, discharge time will be determined by your treatment team.   Patients discharged the day of surgery will not be allowed to drive home, and someone needs to stay with them for 24 hours.  SURGICAL WAITING ROOM VISITATION Patients may have no more than 2 support people in the waiting area - these visitors may rotate.   Pre-op nurse will coordinate an appropriate time for 1 ADULT support person, who may not rotate, to accompany patient in pre-op.  Children under the age of 17 must have an adult with them who is not the patient and must remain in the main waiting area with an adult.  If the patient needs to stay at the hospital during part of their recovery, the visitor guidelines for inpatient rooms apply.  Please refer to the [facility] website for the visitor guidelines for any additional information.   If you received a COVID test during your pre-op visit  it is requested that you wear a mask when out  in public, stay away from anyone that may not be feeling well and notify your surgeon if you develop symptoms. If you have been in contact with anyone that has tested positive in the last 10 days please notify you surgeon.    Special instructions:   [place]- Preparing For Surgery  Before surgery, you can play an important role. Because skin is not sterile, your skin needs to be as free of germs as possible. You can reduce the number of germs on your skin by washing with CHG (chlorahexidine gluconate) Soap before  surgery.  CHG is an antiseptic cleaner which kills germs and bonds with the skin to continue killing germs even after washing.    Oral Hygiene is also important to reduce your risk of infection.  Remember - BRUSH YOUR TEETH THE MORNING OF SURGERY WITH YOUR REGULAR TOOTHPASTE  Please do not use if you have an allergy to CHG or antibacterial soaps. If your skin becomes reddened/irritated stop using the CHG.  Do not shave (including legs and underarms) for at least 48 hours prior to first CHG shower. It is OK to shave your face.  Please follow these instructions carefully.   Shower the NIGHT BEFORE SURGERY and the MORNING OF SURGERY  If you chose to wash your hair, wash your hair first as usual with your normal shampoo.  After you shampoo, rinse your hair and body thoroughly to remove the shampoo.  Use CHG Soap as you would any other liquid soap. You can apply CHG directly to the skin and wash gently with a scrungie or a clean washcloth.   Apply the CHG Soap to your body ONLY FROM THE NECK DOWN.  Do not use on open wounds or open sores. Avoid contact with your eyes, ears, mouth and genitals (private parts). Wash Face and genitals (private parts)  with your normal soap.   Wash thoroughly, paying special attention to the area where your surgery will be performed.  Thoroughly rinse your body with warm water from the neck down.  DO NOT shower/wash with your normal soap after using and rinsing off the CHG Soap.  Pat yourself dry with a CLEAN TOWEL.  Wear CLEAN PAJAMAS to bed the night before surgery  Place CLEAN SHEETS on your bed the night before your surgery  DO NOT SLEEP WITH PETS.   Day of Surgery: Take a shower with CHG soap. Do not wear jewelry or makeup Do not wear lotions, powders, perfumes/colognes, or deodorant. Do not shave 48 hours prior to surgery.  Men may shave face and neck. Do not bring valuables to the hospital.  [facility] is not responsible for any belongings or  valuables. Do not wear nail polish, gel polish, artificial nails, or any other type of covering on natural nails (fingers and toes) If you have artificial nails or gel coating that need to be removed by a nail salon, please have this removed prior to surgery. Artificial nails or gel coating may interfere with anesthesia's ability to adequately monitor your vital signs. Wear Clean/Comfortable clothing the morning of surgery Remember to brush your teeth WITH YOUR REGULAR TOOTHPASTE.   Please read over the following fact sheets that you were given.    If you received a COVID test during your pre-op visit  it is requested that you wear a mask when out in public, stay away from anyone that may not be feeling well and notify your surgeon if you develop symptoms. If you have been in contact  with anyone that has tested positive in the last 10 days please notify you surgeon.

## 2023-01-17 ENCOUNTER — Telehealth: Payer: Self-pay | Admitting: *Deleted

## 2023-01-17 ENCOUNTER — Encounter: Payer: Self-pay | Admitting: *Deleted

## 2023-01-17 ENCOUNTER — Other Ambulatory Visit: Payer: Self-pay

## 2023-01-17 ENCOUNTER — Encounter (HOSPITAL_COMMUNITY)
Admission: RE | Admit: 2023-01-17 | Discharge: 2023-01-17 | Disposition: A | Discharge status: Home / Self Care | Payer: Medicare Other | Source: Ambulatory Visit | Attending: Surgery

## 2023-01-17 ENCOUNTER — Encounter (HOSPITAL_COMMUNITY): Payer: Self-pay

## 2023-01-17 VITALS — BP 134/56 | HR 63 | Temp 98.1°F | Resp 17 | Ht 61.0 in | Wt 160.0 lb

## 2023-01-17 DIAGNOSIS — Z01810 Encounter for preprocedural cardiovascular examination: Secondary | ICD-10-CM | POA: Diagnosis not present

## 2023-01-17 DIAGNOSIS — Z01812 Encounter for preprocedural laboratory examination: Secondary | ICD-10-CM | POA: Insufficient documentation

## 2023-01-17 DIAGNOSIS — R001 Bradycardia, unspecified: Secondary | ICD-10-CM | POA: Diagnosis not present

## 2023-01-17 DIAGNOSIS — Z01818 Encounter for other preprocedural examination: Secondary | ICD-10-CM | POA: Diagnosis present

## 2023-01-17 DIAGNOSIS — I10 Essential (primary) hypertension: Secondary | ICD-10-CM | POA: Diagnosis not present

## 2023-01-17 DIAGNOSIS — C50211 Malignant neoplasm of upper-inner quadrant of right female breast: Secondary | ICD-10-CM | POA: Diagnosis not present

## 2023-01-17 NOTE — Telephone Encounter (Signed)
Spoke with patient to follow up from [facility] and assess navigation needs. Patient denies any questions or concerns at this time. Encouraged her to cal should anything arise. Patient verbalized understanding.

## 2023-01-17 NOTE — Progress Notes (Signed)
PCP - Dr Jarome Matin- [facility] Cardiologist - denies- denies any cardiac issues or symptoms  PPM/ICD - denies   Chest x-ray - N/A EKG - 01/17/2023  Stress Test - Pt reports having abnormal stress test in 2011 prior to cath ECHO - denies Cardiac Cath - 2011 with Dr Jacinto Halim  Sleep Study - denies   Fasting Blood Sugar - N/A  Last dose of GLP1 agonist-  N/A   Blood Thinner Instructions: N/A- pt was on Xarelto years go, states she does not take this.  Aspirin Instructions: Follow your surgeon's instructions on stopping Aspirin. If no instructions were given, please contact your surgeon's office.   ERAS Protcol - ERAS + ensure per order   COVID TEST-  N/A   Anesthesia review: yes- seed placement scheduled for 01/22/23.    Patient denies shortness of breath, fever, cough and chest pain at PAT appointment   All instructions explained to the patient, with a verbal understanding of the material. Patient agrees to go over the instructions while at home for a better understanding. The opportunity to ask questions was provided.

## 2023-01-20 ENCOUNTER — Telehealth: Payer: Self-pay | Admitting: Genetic Counselor

## 2023-01-20 ENCOUNTER — Encounter: Payer: Self-pay | Admitting: Genetic Counselor

## 2023-01-20 DIAGNOSIS — Z1379 Encounter for other screening for genetic and chromosomal anomalies: Secondary | ICD-10-CM | POA: Insufficient documentation

## 2023-01-20 NOTE — Telephone Encounter (Signed)
Contacted patient in attempt to disclose results of genetic testing.  LVM with contact information requesting a call back.    Sent Dr. Magnus Ivan message informing him of patient's TMEM127 positive result, consistent with hereditary paraganglioma-pheochromocytoma syndrome and increased risk for pheochromocytomas given surgery 10/3.

## 2023-01-20 NOTE — Progress Notes (Signed)
Anesthesia Chart Review:  Case: [id number] Date/Time: 01/23/23 1245   Procedure: RADIOACTIVE SEED GUIDED RIGHT BREAST LUMPECTOMY (Right)   Anesthesia type: General   Pre-op diagnosis: RIGHT BREAST CANCER   [facility] / [facility]   Surgeons: [name], MD       DISCUSSION: Patient is an 82 year old female scheduled for the above procedure.  History includes never smoker, postoperative N/V, HTN, HLD, CKD, PVCs (asymptomatic), myocardial bridge (normal coronaries with mLAD tortuosity with intramyocardial bridging 03/26/10), lung Carcinoid tumor (s/p right VATS, RUL wedge resection for low grade neuroendocrine carcinoma 10/19/07), breast cancer (s/p left breast lumpectomy 11/18/12, re-excision margins 12/10/12, s/p radiation; 12/31/22 right breast biopsy: invasive mammary carcinoma), thymectomy (~ 1974), scoliosis, DVT (RLE ~ 1971), skin cancer (SCC, BCC), osteoarthritis (left TKA 03/09/18), hepatitis B (1970's following needle stick). She is a retired Charity fundraiser.  RSL is scheduled for 01/22/23 at 1:30 PM. She had labs on 01/08/23 via [facility]. Anesthesia team to evaluate on the day of surgery.   VS: BP (!) 134/56   Pulse 63   Temp 36.7 C (Oral)   Resp 17   Ht 5\' 1"  (1.549 m)   Wt 72.6 kg   SpO2 97%   BMI 30.23 kg/m    PROVIDERS: Garlan Fillers, MD is PCP  Serena Croissant, MD is HEM-ONC Lonie Peak, MD is RAD-ONC   LABS: Most recent lab results in [facility] include: Lab Results  Component Value Date   WBC 5.1 01/08/2023   HGB 12.6 01/08/2023   HCT 38.1 01/08/2023   PLT 170 01/08/2023   GLUCOSE 97 01/08/2023   ALT 14 01/08/2023   AST 17 01/08/2023   NA 142 01/08/2023   K 4.0 01/08/2023   CL 106 01/08/2023   CREATININE 1.17 (H) 01/08/2023   BUN 22 01/08/2023   CO2 30 01/08/2023   INR 1.06 03/04/2018    EKG: 01/17/23: Sinus bradycardia at 57 bpm  Otherwise normal ECG When compared with ECG of 04-Mar-2018 12:32, No significant change since last tracing Confirmed by  Alverda Skeans ([id number]) on 01/17/2023 10:30:49 PM   CV: Cath 03/26/2010: Impression: 1.  Hyperdynamic left ventricular systolic function.  EF 65 to 70%.  Normal left ventricular end-diastolic pressure. 2.  Normal coronary artery with right dominant circulation and mid left anterior descending tortuosity with intramyocardial bridging. - Shortness of breath secondary to diastolic dysfunction and myocardial bridging.  US Carotid 12/04/01: IMPRESSION  NO SIGNIFICANT PLAQUE FORMATION NOR EVIDENCE OF HEMODYNAMICALLY SIGNIFICANT STENOSIS.    Past Medical History:  Diagnosis Date   Anxiety    Asymptomatic PVCs    "occasional"   CKD (chronic kidney disease), stage III (HCC)    Diverticulosis    Hepatitis B 1970's   "retired Engineer, civil (consulting),  needle stick, 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,  no treatment"   History of basal cell carcinoma (BCC) excision    face ,  right lower extremity   History of colon polyps    History of DVT of lower extremity    RLE  yrs ago   History of external beam radiation therapy 01-13-2013  to 02-09-2013   left breast   History of squamous cell carcinoma excision    abdomen   Hyperlipidemia    Hypertension    Insomnia    infreqently takes Lunesta   Lung cancer [facility]) 2009   10-16-2007  s/p  right VATS with wedge resection right upper lobe posterior section/  low grade neuroendocrine tumor and noncaseating granuloma (last Chest  CT 08-14-2010  no recurrence with stable bilateral pulm. nodules)  per pt no further treatment , followed by pcp   Malignant neoplasm of upper-inner quadrant of left breast in female, estrogen receptor positive [facility]) ONCOLOGIST-- DR Darnelle Catalan   dx 07/ 2014-- Invasive Ductal carcinoma, Stage IIA, Grade 1 (pT2pN0)-- 11-18-2012  s/p  left breast lumpectomy with sln dissection re-excsion left breast 12-10-2012,  completed radiation 02-09-2013, started antiestrogen therapy 02-24-2013   Myocardial bridge    per cardiac cath 03-26-2010  LAD tortousity with intramyocardial  bridge   Osteoarthritis    ,  knees, thumbs   Osteopenia    Personal history of radiation therapy 2014   left breast cancer   PONV (postoperative nausea and vomiting)    Scoliosis    SUI (stress urinary incontinence, female)    Wears glasses    Wears glasses     Past Surgical History:  Procedure Laterality Date   BASAL CELL CARCINOMA EXCISION     "right face; RLE" (11/18/2012)   BREAST BIOPSY Right ~ 1962   "benign"    BREAST BIOPSY Right 10/2012   "benign cyst"    BREAST BIOPSY Left 10/2012   BREAST BIOPSY Right 12/31/2022   [place] RT BREAST BX W LOC DEV 1ST LESION IMG BX SPEC US GUIDE 12/31/2022 [facility]MOGR[facility]   BREAST EXCISIONAL BIOPSY Right    BREAST LUMPECTOMY Left 2014   BREAST LUMPECTOMY WITH NEEDLE LOCALIZATION AND AXILLARY SENTINEL LYMPH NODE BX Left 11/18/2012   Procedure: LEFT BREAST LUMPECTOMY WITH NEEDLE LOCALIZATION AND AXILLARY SENTINEL LYMPH NODE BX;  Surgeon: Emelia Loron, MD;  Location: [facility];  Service: General;  Laterality: Left;   CARDIAC CATHETERIZATION  03-26-2010   dr Jacinto Halim   normal coronaries,  LAD tortousity with intramyocardial bridge,  normal LVF, ef 65-70%   CHOLECYSTECTOMY OPEN  1970s   AND APPENDECTOMY   COLONOSCOPY     "I've had a few; last one was 2010" (11/18/2012)   DILATION AND CURETTAGE OF UTERUS  yrs ago   LUNG BIOPSY Right 2009   RE-EXCISION OF BREAST LUMPECTOMY Left 12/10/2012   Procedure: RE-EXCISION OF BREAST LUMPECTOMY;  Surgeon: Emelia Loron, MD;  Location: [facility];  Service: General;  Laterality: Left;   SQUAMOUS CELL CARCINOMA EXCISION     "?abdomen" (11/18/2012)   THYMECTOMY  ~ 1994   "benign growth"   TONSILLECTOMY AND ADENOIDECTOMY  age 53   TOTAL KNEE ARTHROPLASTY Left 03/09/2018   Procedure: LEFT TOTAL KNEE ARTHROPLASTY-ADDUCTOR BLOCK;  Surgeon: Ollen Gross, MD;  Location: [facility];  Service: Orthopedics;  Laterality: Left;    VAGINAL HYSTERECTOMY  1974   retained ovaries   VIDEO ASSISTED  THORACOSCOPY (VATS)/WEDGE RESECTION Right 10-16-2007   dr Edwyna Shell  @[facility]    posterior section right upper lobe (minithoracotomy)    MEDICATIONS:  acetaminophen (TYLENOL) 500 MG tablet   aspirin EC 81 MG tablet   atorvastatin (LIPITOR) 10 MG tablet   Calcium Carbonate-Vitamin D (CALTRATE 600+D PO)   carvedilol (COREG) 25 MG tablet   Cholecalciferol (VITAMIN D3) 10 MCG (400 UNIT) CAPS   cycloSPORINE (RESTASIS) 0.05 % ophthalmic emulsion   gabapentin (NEURONTIN) 300 MG capsule   HYDROmorphone (DILAUDID) 2 MG tablet   losartan (COZAAR) 50 MG tablet   methocarbamol (ROBAXIN) 500 MG tablet   Multiple Vitamin (MULTIVITAMIN WITH MINERALS) TABS tablet   NONFORMULARY OR COMPOUNDED ITEM   Polyethyl Glyc-Propyl Glyc PF (SYSTANE PRESERVATIVE FREE) 0.4-0.3 % SOLN   potassium chloride SA (K-DUR,KLOR-CON) 20 MEQ tablet  rivaroxaban (XARELTO) 10 MG TABS tablet   sodium chloride (MURO 128) 5 % ophthalmic ointment   triamterene-hydrochlorothiazide (MAXZIDE) 75-50 MG per tablet   venlafaxine XR (EFFEXOR-XR) 37.5 MG 24 hr capsule   verapamil (VERELAN PM) 180 MG 24 hr capsule   No current facility-administered medications for this encounter.    topical emolient (BIAFINE) emulsion   She was on Xarelto in late 2019 for DVT prophylaxis post left TKA, but she is no longer on this. She was advised to follow surgeon recommendations for perioperative ASA.    Shonna Chock, PA-C Surgical Short Stay/Anesthesiology [facility] Phone [phone] [facility] Phone [phone] 01/20/2023 11:21 AM

## 2023-01-20 NOTE — Anesthesia Preprocedure Evaluation (Signed)
Anesthesia Evaluation  Patient identified by MRN, date of birth, ID band Patient awake    History of Anesthesia Complications (+) PONV and history of anesthetic complications  Airway Mallampati: II  TM Distance: >3 FB Neck ROM: Full    Dental no notable dental hx.    Pulmonary neg pulmonary ROS   Pulmonary exam normal        Cardiovascular hypertension, Pt. on medications and Pt. on home beta blockers + DVT   Rhythm:Regular Rate:Normal     Neuro/Psych   Anxiety     negative neurological ROS     GI/Hepatic negative GI ROS,,,(+) Hepatitis -  Endo/Other  negative endocrine ROS    Renal/GU      Musculoskeletal  (+) Arthritis , Osteoarthritis,  Breast Ca   Abdominal Normal abdominal exam  (+)   Peds  Hematology Lab Results      Component                Value               Date                      WBC                      5.1                 01/08/2023                HGB                      12.6                01/08/2023                HCT                      38.1                01/08/2023                MCV                      95.7                01/08/2023                PLT                      170                 01/08/2023             Lab Results      Component                Value               Date                      NA                       142                 01/08/2023                K  4.0                 01/08/2023                CO2                      30                  01/08/2023                GLUCOSE                  97                  01/08/2023                BUN                      22                  01/08/2023                CREATININE               1.17 (H)            01/08/2023                CALCIUM                  9.8                 01/08/2023                EGFR                     54 (L)              11/20/2016                GFRNONAA                 46  (L)              01/08/2023              Anesthesia Other Findings   Reproductive/Obstetrics                             Anesthesia Physical Anesthesia Plan  ASA: 3  Anesthesia Plan: General   Post-op Pain Management: Tylenol PO (pre-op)*   Induction: Intravenous  PONV Risk Score and Plan: 4 or greater and Ondansetron, Dexamethasone and Treatment may vary due to age or medical condition  Airway Management Planned: Mask and LMA  Additional Equipment: None  Intra-op Plan:   Post-operative Plan: Extubation in OR  Informed Consent: I have reviewed the patients History and Physical, chart, labs and discussed the procedure including the risks, benefits and alternatives for the proposed anesthesia with the patient or authorized representative who has indicated his/her understanding and acceptance.     Dental advisory given  Plan Discussed with: CRNA  Anesthesia Plan Comments: (PAT note written 01/20/2023 by Shonna Chock, PA-C.  )       Anesthesia Quick Evaluation

## 2023-01-21 ENCOUNTER — Ambulatory Visit: Payer: Self-pay | Admitting: Genetic Counselor

## 2023-01-21 ENCOUNTER — Encounter: Payer: Self-pay | Admitting: Genetic Counselor

## 2023-01-21 ENCOUNTER — Telehealth: Payer: Self-pay | Admitting: Genetic Counselor

## 2023-01-21 DIAGNOSIS — Z853 Personal history of malignant neoplasm of breast: Secondary | ICD-10-CM

## 2023-01-21 DIAGNOSIS — C50212 Malignant neoplasm of upper-inner quadrant of left female breast: Secondary | ICD-10-CM

## 2023-01-21 DIAGNOSIS — Z1589 Genetic susceptibility to other disease: Secondary | ICD-10-CM

## 2023-01-21 DIAGNOSIS — Z85118 Personal history of other malignant neoplasm of bronchus and lung: Secondary | ICD-10-CM

## 2023-01-21 DIAGNOSIS — Z1379 Encounter for other screening for genetic and chromosomal anomalies: Secondary | ICD-10-CM

## 2023-01-21 DIAGNOSIS — Z803 Family history of malignant neoplasm of breast: Secondary | ICD-10-CM

## 2023-01-21 DIAGNOSIS — Z170 Estrogen receptor positive status [ER+]: Secondary | ICD-10-CM

## 2023-01-21 NOTE — Telephone Encounter (Signed)
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   Discussed that the mutation is associated with increased risk for pheochromoctyomas, although penetrance is not well-defined.  Discussed importance of blood pressure monitoring and availability of screening through urine/blood metanephrines and imaging.  Discussed that Dr. Magnus Ivan was notified, as providers may wish to perform additional labs prior to surgery procedures.  Recommended she discuss her screening approach with her providers/ referral to endocrinology is available.  Discussed relatives should be offered genetic testing.    Detailed clinic note to follow.

## 2023-01-22 ENCOUNTER — Ambulatory Visit
Admission: RE | Admit: 2023-01-22 | Discharge: 2023-01-22 | Disposition: A | Discharge status: Home / Self Care | Payer: Medicare Other | Source: Ambulatory Visit | Attending: Surgery | Admitting: Surgery

## 2023-01-22 DIAGNOSIS — C50811 Malignant neoplasm of overlapping sites of right female breast: Secondary | ICD-10-CM | POA: Diagnosis not present

## 2023-01-22 DIAGNOSIS — Z853 Personal history of malignant neoplasm of breast: Secondary | ICD-10-CM

## 2023-01-22 HISTORY — PX: BREAST BIOPSY: SHX20

## 2023-01-22 NOTE — H&P (Signed)
REFERRING PHYSICIAN: Sabas Sous, MD PROVIDER: Wayne Both, MD MRN: [id number] DOB: [birth year] DATE OF ENCOUNTER: 01/08/2023 Subjective   Chief Complaint: New Patient (New Breast Cancer )  History of Present Illness: Teresa Chapman is a 82 y.o. female who is seen today as an office consultation for evaluation of New Patient (New Breast Cancer )  This is a pleasant 82 year old female who was found on recent screen mammography to have 2 small masses in the right breast. There were located at the 12 o'clock position. The total size measured 1.1 cm. Ultrasound showed no enlarged lymph nodes. She underwent a biopsy showing an invasive ductal carcinoma of the right breast which was grade 2. It was 100% ER positive, 30% PR positive, HER2 negative, and had a Ki-67 of 20%. She had a previous breast cancer in 2014 in the left breast. She has had benign breast biopsy on the right side the past. She is otherwise without complaints.  Review of Systems: A complete review of systems was obtained from the patient. I have reviewed this information and discussed as appropriate with the patient. See HPI as well for other ROS.  ROS   Medical History: Past Medical History:  Diagnosis Date  Anxiety  Chronic kidney disease  History of cancer  Hyperlipidemia  Hypertension   There is no problem list on file for this patient.  Past Surgical History:  Procedure Laterality Date  ARTHROPLASTY TOTAL KNEE 2019  CHOLECYSTECTOMY  sq cell carcinoma excision    Allergies  Allergen Reactions  Amoxicillin-Pot Clavulanate Rash  Codeine Itching, Rash and Unknown  REACTION: hyperactivity, itching   Current Outpatient Medications on File Prior to Visit  Medication Sig Dispense Refill  venlafaxine (EFFEXOR-XR) 37.5 MG XR capsule TAKE 1 CAPSULE BY MOUTH EVERY DAY WITH FOOD FOR 90 DAYS for 90  atorvastatin (LIPITOR) 10 MG tablet Take 10 mg by mouth once daily  losartan (COZAAR) 50 MG tablet Take 50 mg  by mouth once daily  potassium chloride (KLOR-CON M20) 20 MEQ ER tablet Take 1 tablet by mouth every 12 (twelve) hours  propranoloL (INDERAL) 20 MG tablet Take 20 mg by mouth 2 (two) times daily  rivaroxaban (XARELTO) 10 mg tablet Take by mouth  triamterene-hydroCHLOROthiazide (MAXZIDE) 75-50 mg tablet TAKE 1/2 TABLET BY MOUTH ONCE DAILY ORALLY AS DIRECTED  verapamiL (VERELAN) 180 MG SR capsule TAKE 1 CAPSULE BY MOUTH EVERY DAY for 90   No current facility-administered medications on file prior to visit.   Family History  Problem Relation Age of Onset  High blood pressure (Hypertension) Mother  Colon cancer Mother  Breast cancer Mother  Coronary Artery Disease (Blocked arteries around heart) Father    Social History   Tobacco Use  Smoking Status Never  Smokeless Tobacco Never    Social History   Socioeconomic History  Marital status: Married  Tobacco Use  Smoking status: Never  Smokeless tobacco: Never  Substance and Sexual Activity  Alcohol use: Never  Drug use: Never   Objective:  There were no vitals filed for this visit.  There is no height or weight on file to calculate BMI.  Physical Exam   She appears well on examination. She is conversant.  There is ecchymosis of the right breast from the biopsy but no palpable breast masses. She has a well-healed old incision on the breast. There is no axillary adenopathy. Nipple areolar complex is normal.  Labs, Imaging and Diagnostic Testing: I have reviewed her mammograms, ultrasound, pathology results, and previous  history  Assessment and Plan:   Diagnoses and all orders for this visit:  Invasive ductal carcinoma of breast, female, right (CMS/HHS-HCC)   I discussed the diagnosis with the patient and her husband. From a surgical standpoint we discussed breast conservation versus mastectomy. She is interested in breast conservation similar to her previous surgery. I next discussed proceeding with a radioactive seed  guided right breast lumpectomy. I explained surgical procedure in detail. We discussed the risks which includes but is not limited to bleeding, infection, injury to surrounding structures, the need for further surgery margins are positive, DVT, cardiopulmonary issues, postoperative recovery, etc. They understand and wish to proceed with surgery which will be scheduled.

## 2023-01-22 NOTE — Progress Notes (Signed)
GENETIC TEST RESULTS  Patient Name: Teresa Chapman Patient Age: 82 y.o. Encounter Date: 01/21/2023  Referring Provider: Serena Croissant, MD  Teresa Chapman was seen in the Cancer Genetics clinic on January 08, 2023 due to a personal and family history of cancer and concern regarding a hereditary predisposition to cancer in the family. Please refer to the prior Genetics clinic note for more information regarding Teresa Chapman's medical and family histories and our assessment at the time.  Genetic testing results were disclosed to her by telephone.   CANCER HISTORY:   In September 2024, at the age of 23, Teresa Chapman was diagnosed with invasive ductal carcinoma of the right breast (ER+/PR+/HER2-). The treatment plan is pending.   Teresa Chapman has a history of left invasive ductal carcinoma (ER+/PR+/HER2-), diagnosed in 2014 at the age of 44, s/p lumpectomy, radiation, and anti-estrogens.  In 2009, she was diagnosed with a low grade neuroendocrine tumor of the lung s/p right upper lobe posterior segmentectomy.       FAMILY HISTORY:  We obtained a detailed, 4-generation family history.  Significant diagnoses are listed below:      Family History  Problem Relation Age of Onset   Colon cancer Mother 71   Breast cancer Mother          dx 53s   Breast cancer Maternal Aunt          dx >50   Breast cancer Cousin          x2 mat female cousins; one dx >50 and other dx unk age   Breast cancer Cousin          x2 pat female cousins; dx >50     Teresa Chapman thinks a maternal cousin may have had hereditary cancer genetic testing previously but is unaware of the result. There is no reported Ashkenazi Jewish ancestry. There is no known consanguinity.  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 Specifically, this variant is  [id number] ([id number]*39) .  No other deleterious variants were detected in the [facility] CancerNext-Expanded +RNA Panel.    The  CancerNext-Expanded gene panel offered by [facility] and includes sequencing, rearrangement, and RNA analysis for the following 71 genes:  AIP, ALK, APC, ATM, BAP1, BARD1, BMPR1A, BRCA1, BRCA2, BRIP1, CDC73, CDH1, CDK4, CDKN1B, CDKN2A, CHEK2, DICER1, FH, FLCN, KIF1B, LZTR1,MAX, MEN1, MET, MLH1, MSH2, MSH6, MUTYH, NF1, NF2, NTHL1, PALB2, PHOX2B, PMS2, POT1, PRKAR1A, PTCH1, PTEN, RAD51C,RAD51D, RB1, RET, SDHA, SDHAF2, SDHB, SDHC, SDHD, SMAD4, SMARCA4, SMARCB1, SMARCE1, STK11, SUFU, TMEM127, TP53,TSC1, TSC2 and VHL (sequencing and deletion/duplication); AXIN2, CTNNA1, EGFR, EGLN1, HOXB13, KIT, MITF, MSH3, PDGFRA, POLD1 and POLE (sequencing only); EPCAM and GREM1 (deletion/duplication only).   The test report will be scanned into EPIC and is located under the Molecular Pathology section of the Results Review tab.  A portion of the result report is included below for reference. Genetic testing reported out on January 20, 2023.      Of note, this result does not explain the personal and family history of breast cancer.  Possible explanations for the breast cancer in the family may include: There may be no hereditary risk for breast cancer in the family. The breast cancers in Teresa Chapman and/or her family may be sporadic/familial or due to other genetic and environmental factors.  Most cancer is not hereditary.  There may be a gene mutation in one of these genes that current testing methods cannot detect but that  chance is small. There could be another gene that has not yet been discovered, or that we have not yet tested, that is responsible for the breast cancer diagnoses in the family.  It is also possible there is a hereditary cause for the cancer in the family that Teresa Chapman did not inherit.  Therefore, it is important to remain in touch with cancer genetics in the future so that we can continue to offer Teresa Chapman the most up to date genetic testing.    [id number] Clinical Information:  A deleterious  variant in the TMEM127 gene is associated with hereditary paraganglioma and pheochromocytoma (PGL-PCC) syndrome.  The lifetime risk of PGL-PCC tumors associated with TMEM127 is not clear.  PGL-PCC tumors are typically noncancerous, but sometimes develop into cancer or can cause symptoms based on location and/or excess hormones they produce.   Pheochromocytomas have been reported as the most frequent tumor type associated with TMEM127; however, paragangliomas and some cases of clear cell renal cell carcinoma have been reported.  Data suggests that ~25% of the time, TMEM127-associated tumors are bilateral.  Metastatic risk for TMEM127 associated tumors is thought to be low (PMID: 78469629) .    [id number] Management Recommendations: (PMID: 44010272; NCCN Neuroendocrine and Adrenal Tumors V2.2024) Blood pressure monitoring at all medical visits starting at age 20-15 Screening blood test for plasma-free metanephrines or 24-hour urine test for fractionated metanephrines every year starting at age 76-15 Cross-sectional imaging of skull base to pelvis every 2-3 years starting at age 42-15 by whole body MRI (if available) or other non-radiation containing imaging procedures If whole body MRI not available, may consider abdnominal MRI, skull base and neck MRI, and chest CT Education about signs and symptoms of PGL and PCC tumors and prompt reporting of symptoms Symptoms of these tumors may include a lump that gets bigger over time, hearing loss, ringing in the ears, difficulty swallowing, hoarseness of the voice, problems moving the tongue, unexplained sweating, headaches, heart palpitations, paleness, and/or feelings of anxiety or panic Treatment of PGL and PCC may include surgery and/or other treatment Due to risk of bilateral tumors, consideration should be given to cortical-sparing adrenalectomy Blood and/or urine screening for tumors prior to any surgical procedures Complete screening should be performed  prior to planning a pregnancy.    The above recommendations are based on all genes associated with Heredtiary PGL-PCC syndromes and may be modified based on personal/family history factors or gene penetrance.   This information is based on current understanding of the gene and may change in the future.   Implications for Family Members: Hereditary predisposition to cancer due to pathogenic variants in the TMEM127 gene has autosomal dominant inheritance. This means that Chapman individual's first degree relatives (parents, full siblings, children) have a 50% chance of having the same pathogenic variant.  More distant relatives also have Chapman increased chance of having this pathogenic variant. Identification of a pathogenic variant allows for the recognition of at-risk relatives who can pursue testing for the familial variant.  Family members are encouraged to consider genetic testing for this familial pathogenic variant.  They may contact our office at [phone] for more information or to schedule Chapman appointment.  Complimentary testing for the familial variant is available for 90 days.  Family members who live outside of the area are encouraged to find a genetic counselor in their area by visiting: [email or web address].  Resources: [employer]: www.pheopara.org  Plan:  Ms. Hank's surgery team was notified of increased risk for pheochromocytomas based on  genetic testing results.  We discussed with Ms. Dutch that Chapman endocrinology consultation is available to further discuss appropriate screening, taking into account there is no known personal/family history of pheochromocytomas.  She agreed to wait on referral until after breast cancer treatment.  She can speak with her oncologist or her PCP about screening/endocrinology referral.   Family letter sent to encourage family testing.    We encouraged Ms. Tippett to remain in contact with [place] on Chapman annual basis so we can update her  personal and family histories, and let her know of advances in cancer genetics that may benefit the family. Our contact number was provided. Ms. Newstrom questions were answered to her satisfaction today, and she knows she is welcome to call anytime with additional questions.   Tajon Moring M. Rennie Plowman, MS, [facility] Genetic Counselor [email or web address] (P) [phone]

## 2023-01-23 ENCOUNTER — Encounter: Payer: Self-pay | Admitting: Genetic Counselor

## 2023-01-23 ENCOUNTER — Ambulatory Visit (HOSPITAL_COMMUNITY)
Admission: RE | Admit: 2023-01-23 | Discharge: 2023-01-23 | Disposition: A | Discharge status: Home / Self Care | Payer: Medicare Other | Attending: Surgery | Admitting: Surgery

## 2023-01-23 ENCOUNTER — Ambulatory Visit
Admission: RE | Admit: 2023-01-23 | Discharge: 2023-01-23 | Disposition: A | Discharge status: Home / Self Care | Payer: Medicare Other | Source: Ambulatory Visit | Attending: Surgery | Admitting: Surgery

## 2023-01-23 ENCOUNTER — Other Ambulatory Visit: Payer: Self-pay

## 2023-01-23 ENCOUNTER — Ambulatory Visit (HOSPITAL_COMMUNITY): Payer: Medicare Other

## 2023-01-23 ENCOUNTER — Encounter (HOSPITAL_COMMUNITY)
Admission: RE | Disposition: A | Discharge status: Home / Self Care | Payer: Self-pay | Source: Home / Self Care | Attending: Surgery

## 2023-01-23 ENCOUNTER — Ambulatory Visit (HOSPITAL_COMMUNITY): Payer: Medicare Other | Admitting: Vascular Surgery

## 2023-01-23 ENCOUNTER — Encounter (HOSPITAL_COMMUNITY): Payer: Self-pay | Admitting: Surgery

## 2023-01-23 DIAGNOSIS — Z170 Estrogen receptor positive status [ER+]: Secondary | ICD-10-CM | POA: Diagnosis not present

## 2023-01-23 DIAGNOSIS — C50211 Malignant neoplasm of upper-inner quadrant of right female breast: Secondary | ICD-10-CM | POA: Diagnosis present

## 2023-01-23 DIAGNOSIS — C50212 Malignant neoplasm of upper-inner quadrant of left female breast: Secondary | ICD-10-CM | POA: Diagnosis not present

## 2023-01-23 DIAGNOSIS — Z853 Personal history of malignant neoplasm of breast: Secondary | ICD-10-CM

## 2023-01-23 DIAGNOSIS — N189 Chronic kidney disease, unspecified: Secondary | ICD-10-CM | POA: Insufficient documentation

## 2023-01-23 DIAGNOSIS — Z79899 Other long term (current) drug therapy: Secondary | ICD-10-CM | POA: Diagnosis not present

## 2023-01-23 DIAGNOSIS — I129 Hypertensive chronic kidney disease with stage 1 through stage 4 chronic kidney disease, or unspecified chronic kidney disease: Secondary | ICD-10-CM | POA: Diagnosis not present

## 2023-01-23 DIAGNOSIS — F419 Anxiety disorder, unspecified: Secondary | ICD-10-CM | POA: Insufficient documentation

## 2023-01-23 DIAGNOSIS — Z1721 Progesterone receptor positive status: Secondary | ICD-10-CM | POA: Diagnosis not present

## 2023-01-23 DIAGNOSIS — C50911 Malignant neoplasm of unspecified site of right female breast: Secondary | ICD-10-CM | POA: Diagnosis not present

## 2023-01-23 DIAGNOSIS — M1990 Unspecified osteoarthritis, unspecified site: Secondary | ICD-10-CM | POA: Diagnosis not present

## 2023-01-23 DIAGNOSIS — Z803 Family history of malignant neoplasm of breast: Secondary | ICD-10-CM | POA: Diagnosis not present

## 2023-01-23 DIAGNOSIS — I10 Essential (primary) hypertension: Secondary | ICD-10-CM | POA: Insufficient documentation

## 2023-01-23 DIAGNOSIS — C50811 Malignant neoplasm of overlapping sites of right female breast: Secondary | ICD-10-CM | POA: Diagnosis not present

## 2023-01-23 HISTORY — PX: BREAST LUMPECTOMY WITH RADIOACTIVE SEED LOCALIZATION: SHX6424

## 2023-01-23 SURGERY — BREAST LUMPECTOMY WITH RADIOACTIVE SEED LOCALIZATION
Anesthesia: General | Laterality: Right

## 2023-01-23 MED ORDER — TRAMADOL HCL 50 MG PO TABS
50.0000 mg | ORAL_TABLET | Freq: Four times a day (QID) | ORAL | 0 refills | Status: DC | PRN
Start: 2023-01-23 — End: 2023-02-21

## 2023-01-23 MED ORDER — PROPOFOL 10 MG/ML IV BOLUS
INTRAVENOUS | Status: DC | PRN
  Administered 2023-01-23: 160 mg via INTRAVENOUS
  Administered 2023-01-23: 40 mg via INTRAVENOUS

## 2023-01-23 MED ORDER — DEXAMETHASONE SODIUM PHOSPHATE 10 MG/ML IJ SOLN
INTRAMUSCULAR | Status: AC
  Filled 2023-01-23: qty 1

## 2023-01-23 MED ORDER — ORAL CARE MOUTH RINSE: 15.0000 mL | Freq: Once | OROMUCOSAL | Status: AC

## 2023-01-23 MED ORDER — FENTANYL CITRATE (PF) 100 MCG/2ML IJ SOLN: 25.0000 ug | INTRAMUSCULAR | Status: DC | PRN

## 2023-01-23 MED ORDER — LIDOCAINE 2% (20 MG/ML) 5 ML SYRINGE
INTRAMUSCULAR | Status: DC | PRN
  Administered 2023-01-23: 60 mg via INTRAVENOUS

## 2023-01-23 MED ORDER — ONDANSETRON HCL 4 MG/2ML IJ SOLN
INTRAMUSCULAR | Status: DC | PRN
  Administered 2023-01-23: 4 mg via INTRAVENOUS

## 2023-01-23 MED ORDER — BUPIVACAINE-EPINEPHRINE (PF) 0.25% -1:200000 IJ SOLN
INTRAMUSCULAR | Status: AC
  Filled 2023-01-23: qty 30

## 2023-01-23 MED ORDER — FENTANYL CITRATE (PF) 250 MCG/5ML IJ SOLN
INTRAMUSCULAR | Status: AC
  Filled 2023-01-23: qty 5

## 2023-01-23 MED ORDER — DEXAMETHASONE SODIUM PHOSPHATE 10 MG/ML IJ SOLN
INTRAMUSCULAR | Status: DC | PRN
  Administered 2023-01-23: 10 mg via INTRAVENOUS

## 2023-01-23 MED ORDER — PROPOFOL 10 MG/ML IV BOLUS
INTRAVENOUS | Status: AC
  Filled 2023-01-23: qty 20

## 2023-01-23 MED ORDER — LIDOCAINE 2% (20 MG/ML) 5 ML SYRINGE
INTRAMUSCULAR | Status: AC
  Filled 2023-01-23: qty 5

## 2023-01-23 MED ORDER — AMISULPRIDE (ANTIEMETIC) 5 MG/2ML IV SOLN: 10.0000 mg | Freq: Once | INTRAVENOUS | Status: DC | PRN

## 2023-01-23 MED ORDER — ONDANSETRON HCL 4 MG/2ML IJ SOLN
INTRAMUSCULAR | Status: AC
  Filled 2023-01-23: qty 2

## 2023-01-23 MED ORDER — LACTATED RINGERS IV SOLN: INTRAVENOUS | Status: DC

## 2023-01-23 MED ORDER — CIPROFLOXACIN IN D5W 400 MG/200ML IV SOLN
400.0000 mg | INTRAVENOUS | Status: AC
  Administered 2023-01-23: 400 mg via INTRAVENOUS
  Filled 2023-01-23: qty 200

## 2023-01-23 MED ORDER — CHLORHEXIDINE GLUCONATE CLOTH 2 % EX PADS: 6.0000 | MEDICATED_PAD | Freq: Once | CUTANEOUS | Status: DC

## 2023-01-23 MED ORDER — CHLORHEXIDINE GLUCONATE 0.12 % MT SOLN
15.0000 mL | Freq: Once | OROMUCOSAL | Status: AC
  Administered 2023-01-23: 15 mL via OROMUCOSAL
  Filled 2023-01-23: qty 15

## 2023-01-23 MED ORDER — BUPIVACAINE-EPINEPHRINE 0.25% -1:200000 IJ SOLN
INTRAMUSCULAR | Status: DC | PRN
  Administered 2023-01-23: 20 mL

## 2023-01-23 MED ORDER — ENSURE PRE-SURGERY PO LIQD: 296.0000 mL | Freq: Once | ORAL | Status: DC

## 2023-01-23 MED ORDER — 0.9 % SODIUM CHLORIDE (POUR BTL) OPTIME
TOPICAL | Status: DC | PRN
  Administered 2023-01-23: 1000 mL

## 2023-01-23 MED ORDER — ACETAMINOPHEN 500 MG PO TABS
1000.0000 mg | ORAL_TABLET | ORAL | Status: AC
  Administered 2023-01-23: 1000 mg via ORAL
  Filled 2023-01-23: qty 2

## 2023-01-23 SURGICAL SUPPLY — 35 items
ADH SKN CLS APL DERMABOND .7 (GAUZE/BANDAGES/DRESSINGS) ×1
APL PRP STRL LF DISP 70% ISPRP (MISCELLANEOUS) ×1
APPLIER CLIP 9.375 MED OPEN (MISCELLANEOUS) ×1
APR CLP MED 9.3 20 MLT OPN (MISCELLANEOUS) ×1
BAG COUNTER SPONGE SURGICOUNT (BAG) ×1 IMPLANT
BAG SPNG CNTER NS LX DISP (BAG) ×1
BINDER BREAST LRG (GAUZE/BANDAGES/DRESSINGS) IMPLANT
BINDER BREAST XLRG (GAUZE/BANDAGES/DRESSINGS) IMPLANT
CANISTER SUCT 3000ML PPV (MISCELLANEOUS) ×1 IMPLANT
CHLORAPREP W/TINT 26 (MISCELLANEOUS) ×1 IMPLANT
CLIP APPLIE 9.375 MED OPEN (MISCELLANEOUS) ×1 IMPLANT
COVER PROBE W GEL 5X96 (DRAPES) ×1 IMPLANT
COVER SURGICAL LIGHT HANDLE (MISCELLANEOUS) ×1 IMPLANT
DERMABOND ADVANCED .7 DNX12 (GAUZE/BANDAGES/DRESSINGS) ×1 IMPLANT
DEVICE DUBIN SPECIMEN MAMMOGRA (MISCELLANEOUS) ×1 IMPLANT
DRAPE CHEST BREAST 15X10 FENES (DRAPES) ×1 IMPLANT
ELECT CAUTERY BLADE 6.4 (BLADE) ×1 IMPLANT
ELECT REM PT RETURN 9FT ADLT (ELECTROSURGICAL) ×1
ELECTRODE REM PT RTRN 9FT ADLT (ELECTROSURGICAL) ×1 IMPLANT
GLOVE SURG SIGNA 7.5 PF LTX (GLOVE) ×1 IMPLANT
GOWN STRL REUS W/ TWL LRG LVL3 (GOWN DISPOSABLE) ×1 IMPLANT
GOWN STRL REUS W/ TWL XL LVL3 (GOWN DISPOSABLE) ×1 IMPLANT
GOWN STRL REUS W/TWL LRG LVL3 (GOWN DISPOSABLE) ×1
GOWN STRL REUS W/TWL XL LVL3 (GOWN DISPOSABLE) ×1
KIT BASIN OR (CUSTOM PROCEDURE TRAY) ×1 IMPLANT
KIT MARKER MARGIN INK (KITS) ×1 IMPLANT
NDL HYPO 25GX1X1/2 BEV (NEEDLE) ×1 IMPLANT
NEEDLE HYPO 25GX1X1/2 BEV (NEEDLE) ×1
NS IRRIG 1000ML POUR BTL (IV SOLUTION) IMPLANT
PACK GENERAL/GYN (CUSTOM PROCEDURE TRAY) ×1 IMPLANT
SUT MNCRL AB 4-0 PS2 18 (SUTURE) ×1 IMPLANT
SUT VIC AB 3-0 SH 18 (SUTURE) ×1 IMPLANT
SYR CONTROL 10ML LL (SYRINGE) ×1 IMPLANT
TOWEL GREEN STERILE (TOWEL DISPOSABLE) ×1 IMPLANT
TOWEL GREEN STERILE FF (TOWEL DISPOSABLE) ×1 IMPLANT

## 2023-01-23 NOTE — Interval H&P Note (Signed)
History and Physical Interval Note:no change in H and P  01/23/2023 12:12 PM  Teresa Chapman  has presented today for surgery, with the diagnosis of RIGHT BREAST CANCER.  The various methods of treatment have been discussed with the patient and family. After consideration of risks, benefits and other options for treatment, the patient has consented to  Procedure(s): RADIOACTIVE SEED GUIDED RIGHT BREAST LUMPECTOMY (Right) as a surgical intervention.  The patient's history has been reviewed, patient examined, no change in status, stable for surgery.  I have reviewed the patient's chart and labs.  Questions were answered to the patient's satisfaction.     [name]

## 2023-01-23 NOTE — Anesthesia Postprocedure Evaluation (Signed)
Anesthesia Post Note  Patient: Teresa Chapman  Procedure(s) Performed: RADIOACTIVE SEED GUIDED RIGHT BREAST LUMPECTOMY (Right)     Patient location during evaluation: PACU Anesthesia Type: General Level of consciousness: awake and alert Pain management: pain level controlled Vital Signs Assessment: post-procedure vital signs reviewed and stable Respiratory status: spontaneous breathing, nonlabored ventilation, respiratory function stable and patient connected to nasal cannula oxygen Cardiovascular status: blood pressure returned to baseline and stable Postop Assessment: no apparent nausea or vomiting Anesthetic complications: no   No notable events documented.  Last Vitals:  Vitals:   01/23/23 1345 01/23/23 1400  BP: (!) 151/69 (!) 165/69  Pulse: (!) 56 (!) 53  Resp: 15 13  Temp:  36.5 C  SpO2: 96% 96%    Last Pain:  Vitals:   01/23/23 1400  TempSrc:   PainSc: 0-No pain                 Earl Lites P Brittainy Bucker

## 2023-01-23 NOTE — Transfer of Care (Signed)
Immediate Anesthesia Transfer of Care Note  Patient: Teresa Chapman  Procedure(s) Performed: RADIOACTIVE SEED GUIDED RIGHT BREAST LUMPECTOMY (Right)  Patient Location: PACU  Anesthesia Type:General  Level of Consciousness: awake, alert , and oriented  Airway & Oxygen Therapy: Patient Spontanous Breathing and Patient connected to face mask oxygen  Post-op Assessment: Report given to RN and Post -op Vital signs reviewed and stable  Post vital signs: Reviewed and stable  Last Vitals:  Vitals Value Taken Time  BP 181/71 01/23/23 1330  Temp    Pulse 61 01/23/23 1332  Resp 11 01/23/23 1332  SpO2 100 % 01/23/23 1332  Vitals shown include unfiled device data.  Last Pain:  Vitals:   01/23/23 1111  TempSrc:   PainSc: 0-No pain         Complications: No notable events documented.

## 2023-01-23 NOTE — Discharge Instructions (Signed)
[facility] Office Phone Number [phone]  BREAST BIOPSY/ PARTIAL MASTECTOMY: POST OP INSTRUCTIONS  Always review your discharge instruction sheet given to you by the facility where your surgery was performed.  IF YOU HAVE DISABILITY OR FAMILY LEAVE FORMS, YOU MUST BRING THEM TO THE OFFICE FOR PROCESSING.  DO NOT GIVE THEM TO YOUR DOCTOR.  A prescription for pain medication may be given to you upon discharge.  Take your pain medication as prescribed, if needed.  If narcotic pain medicine is not needed, then you may take acetaminophen (Tylenol) or ibuprofen (Advil) as needed. Take your usually prescribed medications unless otherwise directed If you need a refill on your pain medication, please contact your pharmacy.  They will contact our office to request authorization.  Prescriptions will not be filled after 5pm or on week-ends. You should eat very light the first 24 hours after surgery, such as soup, crackers, pudding, etc.  Resume your normal diet the day after surgery. Most patients will experience some swelling and bruising in the breast.  Ice packs and a good support bra will help.  Swelling and bruising can take several days to resolve.  It is common to experience some constipation if taking pain medication after surgery.  Increasing fluid intake and taking a stool softener will usually help or prevent this problem from occurring.  A mild laxative (Milk of Magnesia or Miralax) should be taken according to package directions if there are no bowel movements after 48 hours. Unless discharge instructions indicate otherwise, you may remove your bandages 24-48 hours after surgery, and you may shower at that time.  You may have steri-strips (small skin tapes) in place directly over the incision.  These strips should be left on the skin for 7-10 days.  If your surgeon used skin glue on the incision, you may shower in 24 hours.  The glue will flake off over the next 2-3 weeks.  Any  sutures or staples will be removed at the office during your follow-up visit. ACTIVITIES:  You may resume regular daily activities (gradually increasing) beginning the next day.  Wearing a good support bra or sports bra minimizes pain and swelling.  You may have sexual intercourse when it is comfortable. You may drive when you no longer are taking prescription pain medication, you can comfortably wear a seatbelt, and you can safely maneuver your car and apply brakes. RETURN TO WORK:  ______________________________________________________________________________________ Teresa Chapman should see your doctor in the office for a follow-up appointment approximately two weeks after your surgery.  Your doctor's nurse will typically make your follow-up appointment when she calls you with your pathology report.  Expect your pathology report 2-3 business days after your surgery.  You may call to check if you do not hear from [place] after three days. OTHER INSTRUCTIONS: _YOU MAY REMOVE THE BINDER AND SHOWER STARTING TOMORROW AND THEN PUT THE BINDER BACK ON  ICE PACK, TYLENOL, AND IBUPROFEN ALSO FOR PAIN NO VIGOROUS ACTIVITY FOR ONE WEEK ______________________________________________________________________________________________ _____________________________________________________________________________________________________________________________________ _____________________________________________________________________________________________________________________________________ _____________________________________________________________________________________________________________________________________  WHEN TO CALL YOUR DOCTOR: Fever over 101.0 Nausea and/or vomiting. Extreme swelling or bruising. Continued bleeding from incision. Increased pain, redness, or drainage from the incision.  The clinic staff is available to answer your questions during regular business hours.  Please don't hesitate to  call and ask to speak to one of the nurses for clinical concerns.  If you have a medical emergency, go to the nearest emergency room or call 911.  A surgeon from [facility] is  always on call at the hospital.  For further questions, please visit [email or web address]

## 2023-01-23 NOTE — Op Note (Signed)
RADIOACTIVE SEED GUIDED RIGHT BREAST LUMPECTOMY  Procedure Note  Teresa Chapman 01/23/2023   Pre-op Diagnosis: RIGHT BREAST CANCER     Post-op Diagnosis: same  Procedure(s): RADIOACTIVE SEED GUIDED RIGHT BREAST LUMPECTOMY  Surgeon(s): [name], MD  Anesthesia: General  Staff:  Circulator: Ivin Booty, RN Scrub Person: Dorris Carnes  Estimated Blood Loss: Minimal               Specimens:sent to path  Indications: This is an 82 year old female was recently found to have 2 adjacent masses at the 12 position of her right breast on screen mammography.  Biopsy showed an invasive ductal carcinoma.  The total size of the 2 masses together was 1.1 cm.  The decision was made to proceed with a radioactive seed guided right breast lumpectomy  Procedure: The patient was brought to the operating identifies the correct patient.  She was placed upon the operating table and general anesthesia was induced.  Her right breast was prepped and draped in the usual sterile fashion.  Using the neoprobe I located the radioactive seed at the 12 position on the upper breast/chest.  I elected anesthetized skin in this area with Marcaine.  I then made a longitudinal incision with a scalpel.  I then created superior and inferior skin flaps with the cautery and then dissected down to the deep breast tissue with electrocautery.  I then performed a wide lumpectomy going all way down to the chest wall around the signal from the radioactive seed and the aid of the neoprobe.  Again, I took this all the way down to the chest wall.  I then took the lumpectomy specimen and marked all margins with paint.  An x-ray was performed on the specimen confirmed the radioactive seed and previous biopsy clip were in the specimen.  The specimen was then sent to pathology for evaluation.  I achieved hemostasis with the cautery.  I placed surgical clips around the periphery of the lumpectomy cavity.  The anterior margin is  skin in the posterior margin is the chest wall.  I injected further Marcaine into the incision.  I then closed the subcutaneous tissue with interrupted 3-0 Vicryl sutures and closed the skin with a running 4-0 Monocryl.  Dermabond was then applied.  The patient tolerated the procedure well.  All the counts were correct at the end of the procedure.  The patient was then placed in a breast binder.  She was then extubated in the operating room and taken in stable condition to the recovery room.          [name]   Date: 01/23/2023  Time: 1:24 PM

## 2023-01-24 ENCOUNTER — Encounter: Payer: Self-pay | Admitting: General Practice

## 2023-01-24 ENCOUNTER — Encounter (HOSPITAL_COMMUNITY): Payer: Self-pay | Admitting: Surgery

## 2023-01-24 NOTE — Progress Notes (Signed)
[facility] Spiritual Care Note  Made [facility] follow-up call for post-op check-in. Teresa Chapman reports minimal pain from surgery, great gratitude for how well she is doing, and appreciation for [facility] structure and team. She plans to reach out to Spiritual Care if needed in the future.   [address] [place] B[place], [place], [facility] Pager [phone] Voicemail [phone]

## 2023-01-27 LAB — SURGICAL PATHOLOGY

## 2023-01-29 ENCOUNTER — Encounter: Payer: Self-pay | Admitting: *Deleted

## 2023-02-07 NOTE — Progress Notes (Addendum)
Location of Breast Cancer:  Malignant neoplasm of upper-outer quadrant of right breast in female, estrogen receptor positive   Histology per Pathology Report:  01-23-23 FINAL MICROSCOPIC DIAGNOSIS:  A. BREAST, RIGHT, LUMPECTOMY: - Invasive ductal carcinoma, 1.6 cm, grade 2 - Focal ductal carcinoma in situ, intermediate-grade - Resection margins are negative for carcinoma; closest is the anterior margin at 1.5 mm - Negative for lymphovascular or perineural invasion - Biopsy site changes - See oncology table  ONCOLOGY TABLE:  Procedure: Lumpectomy Specimen Laterality: Right Histologic Type: Invasive ductal carcinoma Histologic Grade:      Glandular (Acinar)/Tubular Differentiation: 3      Nuclear Pleomorphism: 2      Mitotic Rate: 1      Overall Grade: 2 Tumor Size: 1.6 cm Ductal Carcinoma In Situ: Present, intermediate grade, focal Treatment Effect in the Breast: No known presurgical therapy Margins: All margins negative for invasive carcinoma      Distance from Closest Margin (mm): 1.5 mm      Specify Closest Margin (required only if <30mm): Anterior margin DCIS Margins: Uninvolved by DCIS      Distance from Closest Margin (mm): Cannot be determined Regional Lymph Nodes: Not applicable (no lymph nodes submitted or found) Distant Metastasis:      Distant Site(s) Involved: Not applicable Breast Biomarker Testing Performed on Previous Biopsy:      Testing Performed on Case Number: [id number]            Estrogen Receptor: 100%, positive, strong staining intensity            Progesterone Receptor: 30%, positive, strong staining intensity            HER2: Negative (1+)            Ki-67: 20% Pathologic Stage Classification (pTNM, AJCC 8th Edition): pT1c, pN not assigned Representative Tumor Block: A1 Comment(s): None  (v4.5.0.0)   Receptor Status: ER(100%), PR (30%), Her2-neu (neg), Ki-67(20%)  Did patient present with symptoms (if so, please note symptoms) or was this  found on screening mammography?: screening mammogram  Past/Anticipated interventions by surgeon, if any: Pre-op Diagnosis: RIGHT BREAST CANCER     Post-op Diagnosis: same   Procedure(s): RADIOACTIVE SEED GUIDED RIGHT BREAST LUMPECTOMY   Surgeon(s): [name], MD  Past/Anticipated interventions by medical oncology, if any:  01-08-23 Dr. Pamelia Hoit Oncology History  Malignant neoplasm of upper-inner quadrant of left breast in female, estrogen receptor positive (HCC)  11/04/2012 Cancer Staging    Staging form: Breast, AJCC 8th Edition - Clinical: Stage IA (cT1c, cN0, cM0, G2, ER+, PR+, HER2-) - Signed by Serena Croissant, MD on 01/08/2023 Stage prefix: Initial diagnosis Histologic grading system: 3 grade system    12/23/2012 Initial Diagnosis    Malignant neoplasm of upper-inner quadrant of left breast in female, estrogen receptor positive (HCC)    Malignant neoplasm of upper-outer quadrant of right breast in female, estrogen receptor positive (HCC)  12/31/2022 Initial Diagnosis    Screening mammogram detected adjacent right breast masses (12 o'clock position) 1.1 cm, axilla negative, biopsy: Grade 2 IDC ER 100%, PR 30%, HER2 1+ negative, Ki-67 20% (Left breast cancer 2014: Treated with surgery, radiation)    01/08/2023 Cancer Staging    Staging form: Breast, AJCC 8th Edition - Clinical: Stage IA (cT1c, cN0, cM0, G2, ER+, PR+, HER2-) - Signed by Serena Croissant, MD on 01/08/2023 Histologic grading system: 3 grade system  ASSESSMENT AND PLAN:  Malignant neoplasm of upper-outer quadrant of right breast in female, estrogen receptor positive (HCC)  12/31/2022: Screening mammogram detected adjacent right breast masses (12 o'clock position) 1.1 cm, axilla negative, biopsy: Grade 2 IDC ER 100%, PR 30%, HER2 1+ negative, Ki-67 20% (Left breast cancer 2014: Treated with surgery, radiation)   Pathology and radiology counseling: Discussed with the patient, the details of pathology including the type  of breast cancer,the clinical staging, the significance of ER, PR and HER-2/neu receptors and the implications for treatment. After reviewing the pathology in detail, we proceeded to discuss the different treatment options between surgery, radiation, chemotherapy, antiestrogen therapies.   Treatment plan: Breast conserving surgery with sentinel lymph node biopsy Plus or minus radiation (standard versus hypofractionation) Antiestrogen therapy with anastrozole 1 mg daily x 5 years   Return to clinic after surgery to discuss final pathology report Assessment and Plan    Right-sided Invasive Ductal Carcinoma New diagnosis of right-sided breast cancer, 10 years after left-sided breast cancer. The tumor is 1.1 cm, grade 2, with ER/PR positive and HER2 negative receptors. KI67 is 20%, indicating a low growth rate. No lymph node involvement detected. Stage 1A. -Plan for lumpectomy surgery. -Consider radiation therapy post-surgery, potentially using hypofractionation. -Consider Anastrozole for up to 5 years post-surgery.   History of Left-sided Breast Cancer Treated 10 years ago with lumpectomy, radiation, and 5 years of an unknown anti-estrogen pill (possibly Anastrozole or Letrozole). -No current treatment plan, as this is a past medical history.   Follow-up 10 days post-surgery to discuss next steps.     Lymphedema issues, if any:  none to report  Pain issues, if any:  having chronic left shoulder pain   SAFETY ISSUES: Prior radiation? Yes for left breast cancer 10 years ago Pacemaker/ICD? no Possible current pregnancy?no Is the patient on methotrexate? no  Current Complaints / other details: No questions or concerns at this time. 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   Vitals:   02/21/23 1315  BP: 132/60  Pulse: (!) 56  Resp: 18  Temp: (!) 97.5 F (36.4 C)  SpO2: 98%

## 2023-02-08 DIAGNOSIS — Z23 Encounter for immunization: Secondary | ICD-10-CM | POA: Diagnosis not present

## 2023-02-18 DIAGNOSIS — Z170 Estrogen receptor positive status [ER+]: Secondary | ICD-10-CM | POA: Diagnosis not present

## 2023-02-18 DIAGNOSIS — C50212 Malignant neoplasm of upper-inner quadrant of left female breast: Secondary | ICD-10-CM | POA: Diagnosis not present

## 2023-02-20 ENCOUNTER — Ambulatory Visit: Payer: Medicare Other | Admitting: Hematology and Oncology

## 2023-02-21 ENCOUNTER — Ambulatory Visit
Admission: RE | Admit: 2023-02-21 | Discharge: 2023-02-21 | Disposition: A | Discharge status: Home / Self Care | Payer: Medicare Other | Source: Ambulatory Visit | Attending: Radiation Oncology | Admitting: Radiation Oncology

## 2023-02-21 ENCOUNTER — Inpatient Hospital Stay: Payer: Medicare Other | Attending: Hematology and Oncology | Admitting: Hematology and Oncology

## 2023-02-21 ENCOUNTER — Encounter: Payer: Self-pay | Admitting: Radiation Oncology

## 2023-02-21 VITALS — BP 125/94 | HR 63 | Temp 98.1°F | Resp 16 | Ht 61.0 in | Wt 161.4 lb

## 2023-02-21 VITALS — BP 132/60 | HR 56 | Temp 97.5°F | Resp 18 | Ht 61.0 in | Wt 161.0 lb

## 2023-02-21 DIAGNOSIS — Z7901 Long term (current) use of anticoagulants: Secondary | ICD-10-CM | POA: Insufficient documentation

## 2023-02-21 DIAGNOSIS — C50212 Malignant neoplasm of upper-inner quadrant of left female breast: Secondary | ICD-10-CM | POA: Insufficient documentation

## 2023-02-21 DIAGNOSIS — Z79899 Other long term (current) drug therapy: Secondary | ICD-10-CM | POA: Insufficient documentation

## 2023-02-21 DIAGNOSIS — Z7982 Long term (current) use of aspirin: Secondary | ICD-10-CM | POA: Insufficient documentation

## 2023-02-21 DIAGNOSIS — Z170 Estrogen receptor positive status [ER+]: Secondary | ICD-10-CM | POA: Insufficient documentation

## 2023-02-21 DIAGNOSIS — C50411 Malignant neoplasm of upper-outer quadrant of right female breast: Secondary | ICD-10-CM | POA: Insufficient documentation

## 2023-02-21 DIAGNOSIS — Z1501 Genetic susceptibility to malignant neoplasm of breast: Secondary | ICD-10-CM | POA: Insufficient documentation

## 2023-02-21 NOTE — Assessment & Plan Note (Addendum)
12/31/2022: Screening mammogram detected adjacent right breast masses (12 o'clock position) 1.1 cm, axilla negative, biopsy: Grade 2 IDC ER 100%, PR 30%, HER2 1+ negative, Ki-67 20% (Left breast cancer 2014: Treated with surgery, radiation)   Treatment plan: 01/23/2023: Rt Lumpectomy: Grade 2 IDC 1.6 cm with focal DCIS intermediate grade, margins negative, negative for lymphovascular invasion, ER 100%, PR 30%, Ki-67 20%, HER2 negative 1+ Plus or minus radiation (standard versus hypofractionation) Antiestrogen therapy with anastrozole 1 mg daily x 5 years   Return to clinic after radiation is complete

## 2023-02-21 NOTE — Progress Notes (Signed)
Patient Care Team: Garlan Fillers, MD as PCP - General (Internal Medicine) Pershing Proud, RN as Oncology Nurse Navigator Donnelly Angelica, RN as Oncology Nurse Navigator Serena Croissant, MD as Consulting Physician (Hematology and Oncology) Lonie Peak, MD as Attending Physician (Radiation Oncology) [name], MD as Consulting Physician (General Surgery)  DIAGNOSIS:  Encounter Diagnosis  Name Primary?   Malignant neoplasm of upper-inner quadrant of left breast in female, estrogen receptor positive (HCC) Yes    SUMMARY OF ONCOLOGIC HISTORY: Oncology History  Malignant neoplasm of upper-inner quadrant of left breast in female, estrogen receptor positive (HCC)  11/04/2012 Cancer Staging   Staging form: Breast, AJCC 8th Edition - Clinical: Stage IA (cT1c, cN0, cM0, G2, ER+, PR+, HER2-) - Signed by Serena Croissant, MD on 01/08/2023 Stage prefix: Initial diagnosis Histologic grading system: 3 grade system   12/23/2012 Initial Diagnosis   Malignant neoplasm of upper-inner quadrant of left breast in female, estrogen receptor positive (HCC)   01/20/2023 Genetic Testing   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  Report date is 01/20/2023.   The CancerNext-Expanded gene panel offered by [facility] and includes sequencing, rearrangement, and RNA analysis for the following 71 genes:  AIP, ALK, APC, ATM, BAP1, BARD1, BMPR1A, BRCA1, BRCA2, BRIP1, CDC73, CDH1, CDK4, CDKN1B, CDKN2A, CHEK2, DICER1, FH, FLCN, KIF1B, LZTR1,MAX, MEN1, MET, MLH1, MSH2, MSH6, MUTYH, NF1, NF2, NTHL1, PALB2, PHOX2B, PMS2, POT1, PRKAR1A, PTCH1, PTEN, RAD51C,RAD51D, RB1, RET, SDHA, SDHAF2, SDHB, SDHC, SDHD, SMAD4, SMARCA4, SMARCB1, SMARCE1, STK11, SUFU, TMEM127, TP53,TSC1, TSC2 and VHL (sequencing and deletion/duplication); AXIN2, CTNNA1, EGFR, EGLN1, HOXB13, KIT, MITF, MSH3, PDGFRA, POLD1 and POLE (sequencing only); EPCAM and GREM1 (deletion/duplication only).    Malignant neoplasm  of upper-outer quadrant of right breast in female, estrogen receptor positive (HCC)  12/31/2022 Initial Diagnosis   Screening mammogram detected adjacent right breast masses (12 o'clock position) 1.1 cm, axilla negative, biopsy: Grade 2 IDC ER 100%, PR 30%, HER2 1+ negative, Ki-67 20% (Left breast cancer 2014: Treated with surgery, radiation)   01/08/2023 Cancer Staging   Staging form: Breast, AJCC 8th Edition - Clinical: Stage IA (cT1c, cN0, cM0, G2, ER+, PR+, HER2-) - Signed by Serena Croissant, MD on 01/08/2023 Histologic grading system: 3 grade system   01/20/2023 Genetic Testing   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  Report date is 01/20/2023.   The CancerNext-Expanded gene panel offered by [facility] and includes sequencing, rearrangement, and RNA analysis for the following 71 genes:  AIP, ALK, APC, ATM, BAP1, BARD1, BMPR1A, BRCA1, BRCA2, BRIP1, CDC73, CDH1, CDK4, CDKN1B, CDKN2A, CHEK2, DICER1, FH, FLCN, KIF1B, LZTR1,MAX, MEN1, MET, MLH1, MSH2, MSH6, MUTYH, NF1, NF2, NTHL1, PALB2, PHOX2B, PMS2, POT1, PRKAR1A, PTCH1, PTEN, RAD51C,RAD51D, RB1, RET, SDHA, SDHAF2, SDHB, SDHC, SDHD, SMAD4, SMARCA4, SMARCB1, SMARCE1, STK11, SUFU, TMEM127, TP53,TSC1, TSC2 and VHL (sequencing and deletion/duplication); AXIN2, CTNNA1, EGFR, EGLN1, HOXB13, KIT, MITF, MSH3, PDGFRA, POLD1 and POLE (sequencing only); EPCAM and GREM1 (deletion/duplication only).      CHIEF COMPLIANT: Follow-up to discuss results of surgery    History of Present Illness Teresa Chapman is a 82 year old with above-mentioned history of breast cancer who underwent lumpectomy and is here today to discuss results.  She is accompanied by her husband who fell recently and bruised his face while he was blowing the leaves.  She is healing and recovering very well from recent surgery.     ALLERGIES:  is allergic to augmentin [amoxicillin-pot clavulanate] and codeine.  MEDICATIONS:  Current Outpatient Medications  Medication Sig Dispense Refill   acetaminophen (TYLENOL) 500 MG tablet Take 1,000 mg by mouth at bedtime.     aspirin EC 81 MG tablet Take 81 mg by mouth daily.     atorvastatin (LIPITOR) 10 MG tablet Take 10 mg by mouth daily.       Calcium Carbonate-Vitamin D (CALTRATE 600+D PO) Take 1 tablet by mouth daily.     carvedilol (COREG) 25 MG tablet Take 25 mg by mouth 2 (two) times daily with a meal.     Cholecalciferol (VITAMIN D3) 10 MCG (400 UNIT) CAPS Take 400 Units by mouth daily.     cycloSPORINE (RESTASIS) 0.05 % ophthalmic emulsion Place 1 drop into both eyes 2 (two) times daily.     gabapentin (NEURONTIN) 300 MG capsule Take 300 mg by mouth at bedtime.     losartan (COZAAR) 50 MG tablet Take 50 mg by mouth daily.     Multiple Vitamin (MULTIVITAMIN WITH MINERALS) TABS tablet Take 1 tablet by mouth daily.     NONFORMULARY OR COMPOUNDED ITEM Apply 1 Application topically in the morning and at bedtime. Ketoprofen 10%, Lidocaine 5% , Gabapentin 4%, Cyclobenzaprine 2%     Polyethyl Glyc-Propyl Glyc PF (SYSTANE PRESERVATIVE FREE) 0.4-0.3 % SOLN Place 1 drop into both eyes 3 (three) times daily as needed (dry eyes).     potassium chloride SA (K-DUR,KLOR-CON) 20 MEQ tablet Take 20 mEq by mouth 2 (two) times daily.     sodium chloride (MURO 128) 5 % ophthalmic ointment Place 1 Application into both eyes every other day.     triamterene-hydrochlorothiazide (MAXZIDE) 75-50 MG per tablet Take 0.5 tablets by mouth daily.      venlafaxine XR (EFFEXOR-XR) 37.5 MG 24 hr capsule TAKE 1 CAPSULE BY MOUTH TWICE A DAY 180 capsule 3   verapamil (VERELAN PM) 180 MG 24 hr capsule Take 180 mg by mouth every morning.      methocarbamol (ROBAXIN) 500 MG tablet Take 1 tablet (500 mg total) by mouth every 6 (six) hours as needed for muscle spasms. (Patient not taking: Reported on 01/14/2023) 40 tablet 0   rivaroxaban (XARELTO) 10 MG TABS tablet Take 1 tablet (10 mg total) by mouth daily with breakfast for 20 days. Then  resume one 81 mg aspirin once a day. (Patient not taking: Reported on 01/14/2023) 20 tablet 0   No current facility-administered medications for this visit.   Facility-Administered Medications Ordered in Other Visits  Medication Dose Route Frequency Provider Last Rate Last Admin   topical emolient (BIAFINE) emulsion   Topical PRN Lurline Hare, MD   Given at 02/03/13 [phone]    PHYSICAL EXAMINATION: ECOG PERFORMANCE STATUS: 1 - Symptomatic but completely ambulatory  Vitals:   02/21/23 1159  BP: (!) 125/94  Pulse: 63  Resp: 16  Temp: 98.1 F (36.7 C)  SpO2: 98%   Filed Weights   02/21/23 1159  Weight: 161 lb 6.4 oz (73.2 kg)      LABORATORY DATA:  I have reviewed the data as listed    Latest Ref Rng & Units 01/08/2023   12:23 PM 03/10/2018    4:45 AM 03/04/2018   12:26 PM  CMP  Glucose 70 - 99 mg/dL 97  846  79   BUN 8 - 23 mg/dL 22  17  21    Creatinine 0.44 - 1.00 mg/dL 9.62  9.52  8.41   Sodium 135 - 145 mmol/L 142  139  140   Potassium 3.5 - 5.1 mmol/L 4.0  3.1  4.3   Chloride 98 - 111 mmol/L 106  103  102   CO2 22 - 32 mmol/L 30  28  31    Calcium 8.9 - 10.3 mg/dL 9.8  8.2  9.6   Total Protein 6.5 - 8.1 g/dL 6.9   7.0   Total Bilirubin 0.3 - 1.2 mg/dL 0.6   0.8   Alkaline Phos 38 - 126 U/L 78   89   AST 15 - 41 U/L 17   23   ALT 0 - 44 U/L 14   19     Lab Results  Component Value Date   WBC 5.1 01/08/2023   HGB 12.6 01/08/2023   HCT 38.1 01/08/2023   MCV 95.7 01/08/2023   PLT 170 01/08/2023   NEUTROABS 3.3 01/08/2023    ASSESSMENT & PLAN:  Malignant neoplasm of upper-inner quadrant of left breast in female, estrogen receptor positive (HCC) 12/31/2022: Screening mammogram detected adjacent right breast masses (12 o'clock position) 1.1 cm, axilla negative, biopsy: Grade 2 IDC ER 100%, PR 30%, HER2 1+ negative, Ki-67 20% (Left breast cancer 2014: Treated with surgery, radiation)   Treatment plan: 01/23/2023: Rt Lumpectomy: Grade 2 IDC 1.6 cm with focal  DCIS intermediate grade, margins negative, negative for lymphovascular invasion, ER 100%, PR 30%, Ki-67 20%, HER2 negative 1+ Plus or minus radiation (standard versus hypofractionation) Antiestrogen therapy with anastrozole 1 mg daily x 5 years   Return to clinic after radiation is complete   No orders of the defined types were placed in this encounter.  The patient has a good understanding of the overall plan. she agrees with it. she will call with any problems that may develop before the next visit here. Total time spent: 30 mins including face to face time and time spent for planning, charting and co-ordination of care   Tamsen Meek, MD 02/21/23

## 2023-02-26 DIAGNOSIS — Z85828 Personal history of other malignant neoplasm of skin: Secondary | ICD-10-CM | POA: Diagnosis not present

## 2023-02-26 DIAGNOSIS — C44311 Basal cell carcinoma of skin of nose: Secondary | ICD-10-CM | POA: Diagnosis not present

## 2023-02-28 ENCOUNTER — Encounter: Payer: Self-pay | Admitting: *Deleted

## 2023-02-28 ENCOUNTER — Telehealth: Payer: Self-pay | Admitting: Hematology and Oncology

## 2023-02-28 DIAGNOSIS — Z170 Estrogen receptor positive status [ER+]: Secondary | ICD-10-CM

## 2023-02-28 NOTE — Telephone Encounter (Signed)
 Left patient a vm regarding upcoming appointment

## 2023-03-03 DIAGNOSIS — C50212 Malignant neoplasm of upper-inner quadrant of left female breast: Secondary | ICD-10-CM | POA: Diagnosis not present

## 2023-03-03 DIAGNOSIS — Z510 Encounter for antineoplastic radiation therapy: Secondary | ICD-10-CM | POA: Insufficient documentation

## 2023-03-03 DIAGNOSIS — Z170 Estrogen receptor positive status [ER+]: Secondary | ICD-10-CM | POA: Diagnosis not present

## 2023-03-05 DIAGNOSIS — Z23 Encounter for immunization: Secondary | ICD-10-CM | POA: Diagnosis not present

## 2023-03-10 ENCOUNTER — Ambulatory Visit: Payer: Medicare Other | Admitting: Radiation Oncology

## 2023-03-10 ENCOUNTER — Ambulatory Visit
Admission: RE | Admit: 2023-03-10 | Discharge: 2023-03-10 | Disposition: A | Discharge status: Home / Self Care | Payer: Medicare Other | Source: Ambulatory Visit | Attending: Radiation Oncology | Admitting: Radiation Oncology

## 2023-03-10 ENCOUNTER — Other Ambulatory Visit: Payer: Self-pay

## 2023-03-10 DIAGNOSIS — C50212 Malignant neoplasm of upper-inner quadrant of left female breast: Secondary | ICD-10-CM | POA: Diagnosis not present

## 2023-03-10 DIAGNOSIS — Z170 Estrogen receptor positive status [ER+]: Secondary | ICD-10-CM | POA: Diagnosis not present

## 2023-03-10 DIAGNOSIS — Z510 Encounter for antineoplastic radiation therapy: Secondary | ICD-10-CM | POA: Diagnosis not present

## 2023-03-10 LAB — RAD ONC ARIA SESSION SUMMARY
Course Elapsed Days: 0
Plan Fractions Treated to Date: 1
Plan Prescribed Dose Per Fraction: 2.67 Gy
Plan Total Fractions Prescribed: 15
Plan Total Prescribed Dose: 40.05 Gy
Reference Point Dosage Given to Date: 2.67 Gy
Reference Point Session Dosage Given: 2.67 Gy
Session Number: 1

## 2023-03-10 MED ORDER — ALRA NON-METALLIC DEODORANT (RAD-ONC)
1.0000 | Freq: Once | TOPICAL | Status: AC
  Administered 2023-03-10: 1 via TOPICAL

## 2023-03-10 MED ORDER — RADIAPLEXRX EX GEL: Freq: Two times a day (BID) | CUTANEOUS | Status: DC

## 2023-03-10 NOTE — Progress Notes (Signed)

## 2023-03-11 ENCOUNTER — Ambulatory Visit
Admission: RE | Admit: 2023-03-11 | Discharge: 2023-03-11 | Disposition: A | Discharge status: Home / Self Care | Payer: Medicare Other | Source: Ambulatory Visit | Attending: Radiation Oncology | Admitting: Radiation Oncology

## 2023-03-11 ENCOUNTER — Other Ambulatory Visit: Payer: Self-pay

## 2023-03-11 DIAGNOSIS — Z510 Encounter for antineoplastic radiation therapy: Secondary | ICD-10-CM | POA: Diagnosis not present

## 2023-03-11 DIAGNOSIS — C50212 Malignant neoplasm of upper-inner quadrant of left female breast: Secondary | ICD-10-CM | POA: Diagnosis not present

## 2023-03-11 DIAGNOSIS — Z170 Estrogen receptor positive status [ER+]: Secondary | ICD-10-CM | POA: Diagnosis not present

## 2023-03-11 LAB — RAD ONC ARIA SESSION SUMMARY
Course Elapsed Days: 1
Plan Fractions Treated to Date: 2
Plan Prescribed Dose Per Fraction: 2.67 Gy
Plan Total Fractions Prescribed: 15
Plan Total Prescribed Dose: 40.05 Gy
Reference Point Dosage Given to Date: 5.34 Gy
Reference Point Session Dosage Given: 2.67 Gy
Session Number: 2

## 2023-03-12 ENCOUNTER — Ambulatory Visit: Payer: Medicare Other | Admitting: Radiation Oncology

## 2023-03-12 ENCOUNTER — Ambulatory Visit
Admission: RE | Admit: 2023-03-12 | Discharge: 2023-03-12 | Disposition: A | Discharge status: Home / Self Care | Payer: Medicare Other | Source: Ambulatory Visit | Attending: Radiation Oncology

## 2023-03-12 ENCOUNTER — Other Ambulatory Visit: Payer: Self-pay

## 2023-03-12 DIAGNOSIS — Z170 Estrogen receptor positive status [ER+]: Secondary | ICD-10-CM | POA: Diagnosis not present

## 2023-03-12 DIAGNOSIS — Z510 Encounter for antineoplastic radiation therapy: Secondary | ICD-10-CM | POA: Diagnosis not present

## 2023-03-12 DIAGNOSIS — C50212 Malignant neoplasm of upper-inner quadrant of left female breast: Secondary | ICD-10-CM | POA: Diagnosis not present

## 2023-03-12 LAB — RAD ONC ARIA SESSION SUMMARY
Course Elapsed Days: 2
Plan Fractions Treated to Date: 3
Plan Prescribed Dose Per Fraction: 2.67 Gy
Plan Total Fractions Prescribed: 15
Plan Total Prescribed Dose: 40.05 Gy
Reference Point Dosage Given to Date: 8.01 Gy
Reference Point Session Dosage Given: 2.67 Gy
Session Number: 3

## 2023-03-13 ENCOUNTER — Other Ambulatory Visit: Payer: Self-pay

## 2023-03-13 ENCOUNTER — Ambulatory Visit
Admission: RE | Admit: 2023-03-13 | Discharge: 2023-03-13 | Disposition: A | Discharge status: Home / Self Care | Payer: Medicare Other | Source: Ambulatory Visit | Attending: Radiation Oncology | Admitting: Radiation Oncology

## 2023-03-13 DIAGNOSIS — Z510 Encounter for antineoplastic radiation therapy: Secondary | ICD-10-CM | POA: Diagnosis not present

## 2023-03-13 DIAGNOSIS — Z170 Estrogen receptor positive status [ER+]: Secondary | ICD-10-CM | POA: Diagnosis not present

## 2023-03-13 DIAGNOSIS — C50212 Malignant neoplasm of upper-inner quadrant of left female breast: Secondary | ICD-10-CM | POA: Diagnosis not present

## 2023-03-13 LAB — RAD ONC ARIA SESSION SUMMARY
Course Elapsed Days: 3
Plan Fractions Treated to Date: 4
Plan Prescribed Dose Per Fraction: 2.67 Gy
Plan Total Fractions Prescribed: 15
Plan Total Prescribed Dose: 40.05 Gy
Reference Point Dosage Given to Date: 10.68 Gy
Reference Point Session Dosage Given: 2.67 Gy
Session Number: 4

## 2023-03-14 ENCOUNTER — Other Ambulatory Visit: Payer: Self-pay

## 2023-03-14 ENCOUNTER — Ambulatory Visit
Admission: RE | Admit: 2023-03-14 | Discharge: 2023-03-14 | Disposition: A | Discharge status: Home / Self Care | Payer: Medicare Other | Source: Ambulatory Visit | Attending: Radiation Oncology | Admitting: Radiation Oncology

## 2023-03-14 DIAGNOSIS — C50212 Malignant neoplasm of upper-inner quadrant of left female breast: Secondary | ICD-10-CM | POA: Diagnosis not present

## 2023-03-14 DIAGNOSIS — Z170 Estrogen receptor positive status [ER+]: Secondary | ICD-10-CM | POA: Diagnosis not present

## 2023-03-14 DIAGNOSIS — Z510 Encounter for antineoplastic radiation therapy: Secondary | ICD-10-CM | POA: Diagnosis not present

## 2023-03-14 LAB — RAD ONC ARIA SESSION SUMMARY
Course Elapsed Days: 4
Plan Fractions Treated to Date: 5
Plan Prescribed Dose Per Fraction: 2.67 Gy
Plan Total Fractions Prescribed: 15
Plan Total Prescribed Dose: 40.05 Gy
Reference Point Dosage Given to Date: 13.35 Gy
Reference Point Session Dosage Given: 2.67 Gy
Session Number: 5

## 2023-03-16 ENCOUNTER — Ambulatory Visit
Admission: RE | Admit: 2023-03-16 | Discharge: 2023-03-16 | Disposition: A | Discharge status: Home / Self Care | Payer: Medicare Other | Source: Ambulatory Visit | Attending: Radiation Oncology | Admitting: Radiation Oncology

## 2023-03-16 ENCOUNTER — Other Ambulatory Visit: Payer: Self-pay

## 2023-03-16 DIAGNOSIS — C50212 Malignant neoplasm of upper-inner quadrant of left female breast: Secondary | ICD-10-CM | POA: Diagnosis not present

## 2023-03-16 DIAGNOSIS — Z510 Encounter for antineoplastic radiation therapy: Secondary | ICD-10-CM | POA: Diagnosis not present

## 2023-03-16 DIAGNOSIS — Z170 Estrogen receptor positive status [ER+]: Secondary | ICD-10-CM | POA: Diagnosis not present

## 2023-03-16 LAB — RAD ONC ARIA SESSION SUMMARY
Course Elapsed Days: 6
Plan Fractions Treated to Date: 6
Plan Prescribed Dose Per Fraction: 2.67 Gy
Plan Total Fractions Prescribed: 15
Plan Total Prescribed Dose: 40.05 Gy
Reference Point Dosage Given to Date: 16.02 Gy
Reference Point Session Dosage Given: 2.67 Gy
Session Number: 6

## 2023-03-17 ENCOUNTER — Ambulatory Visit
Admission: RE | Admit: 2023-03-17 | Discharge: 2023-03-17 | Disposition: A | Discharge status: Home / Self Care | Payer: Medicare Other | Source: Ambulatory Visit | Attending: Radiation Oncology

## 2023-03-17 ENCOUNTER — Other Ambulatory Visit: Payer: Self-pay

## 2023-03-17 ENCOUNTER — Ambulatory Visit
Admission: RE | Admit: 2023-03-17 | Discharge: 2023-03-17 | Disposition: A | Discharge status: Home / Self Care | Payer: Medicare Other | Source: Ambulatory Visit | Attending: Radiation Oncology | Admitting: Radiation Oncology

## 2023-03-17 DIAGNOSIS — Z510 Encounter for antineoplastic radiation therapy: Secondary | ICD-10-CM | POA: Diagnosis not present

## 2023-03-17 DIAGNOSIS — C50212 Malignant neoplasm of upper-inner quadrant of left female breast: Secondary | ICD-10-CM | POA: Diagnosis not present

## 2023-03-17 DIAGNOSIS — Z170 Estrogen receptor positive status [ER+]: Secondary | ICD-10-CM | POA: Diagnosis not present

## 2023-03-17 LAB — RAD ONC ARIA SESSION SUMMARY
Course Elapsed Days: 7
Plan Fractions Treated to Date: 7
Plan Prescribed Dose Per Fraction: 2.67 Gy
Plan Total Fractions Prescribed: 15
Plan Total Prescribed Dose: 40.05 Gy
Reference Point Dosage Given to Date: 18.69 Gy
Reference Point Session Dosage Given: 2.67 Gy
Session Number: 7

## 2023-03-18 ENCOUNTER — Other Ambulatory Visit: Payer: Self-pay

## 2023-03-18 ENCOUNTER — Ambulatory Visit
Admission: RE | Admit: 2023-03-18 | Discharge: 2023-03-18 | Disposition: A | Discharge status: Home / Self Care | Payer: Medicare Other | Source: Ambulatory Visit | Attending: Radiation Oncology

## 2023-03-18 DIAGNOSIS — Z510 Encounter for antineoplastic radiation therapy: Secondary | ICD-10-CM | POA: Diagnosis not present

## 2023-03-18 DIAGNOSIS — Z170 Estrogen receptor positive status [ER+]: Secondary | ICD-10-CM | POA: Diagnosis not present

## 2023-03-18 DIAGNOSIS — C50212 Malignant neoplasm of upper-inner quadrant of left female breast: Secondary | ICD-10-CM | POA: Diagnosis not present

## 2023-03-18 LAB — RAD ONC ARIA SESSION SUMMARY
Course Elapsed Days: 8
Plan Fractions Treated to Date: 8
Plan Prescribed Dose Per Fraction: 2.67 Gy
Plan Total Fractions Prescribed: 15
Plan Total Prescribed Dose: 40.05 Gy
Reference Point Dosage Given to Date: 21.36 Gy
Reference Point Session Dosage Given: 2.67 Gy
Session Number: 8

## 2023-03-19 ENCOUNTER — Ambulatory Visit
Admission: RE | Admit: 2023-03-19 | Discharge: 2023-03-19 | Disposition: A | Discharge status: Home / Self Care | Payer: Medicare Other | Source: Ambulatory Visit | Attending: Radiation Oncology

## 2023-03-19 ENCOUNTER — Other Ambulatory Visit: Payer: Self-pay

## 2023-03-19 DIAGNOSIS — Z510 Encounter for antineoplastic radiation therapy: Secondary | ICD-10-CM | POA: Diagnosis not present

## 2023-03-19 DIAGNOSIS — C50212 Malignant neoplasm of upper-inner quadrant of left female breast: Secondary | ICD-10-CM | POA: Diagnosis not present

## 2023-03-19 DIAGNOSIS — Z170 Estrogen receptor positive status [ER+]: Secondary | ICD-10-CM | POA: Diagnosis not present

## 2023-03-19 LAB — RAD ONC ARIA SESSION SUMMARY
Course Elapsed Days: 9
Plan Fractions Treated to Date: 9
Plan Prescribed Dose Per Fraction: 2.67 Gy
Plan Total Fractions Prescribed: 15
Plan Total Prescribed Dose: 40.05 Gy
Reference Point Dosage Given to Date: 24.03 Gy
Reference Point Session Dosage Given: 2.67 Gy
Session Number: 9

## 2023-03-24 ENCOUNTER — Other Ambulatory Visit: Payer: Self-pay

## 2023-03-24 ENCOUNTER — Ambulatory Visit
Admission: RE | Admit: 2023-03-24 | Discharge: 2023-03-24 | Disposition: A | Discharge status: Home / Self Care | Payer: Medicare Other | Source: Ambulatory Visit | Attending: Radiation Oncology

## 2023-03-24 ENCOUNTER — Ambulatory Visit
Admission: RE | Admit: 2023-03-24 | Discharge: 2023-03-24 | Disposition: A | Discharge status: Home / Self Care | Payer: Medicare Other | Source: Ambulatory Visit | Attending: Radiation Oncology | Admitting: Radiation Oncology

## 2023-03-24 DIAGNOSIS — Z170 Estrogen receptor positive status [ER+]: Secondary | ICD-10-CM | POA: Insufficient documentation

## 2023-03-24 DIAGNOSIS — Z510 Encounter for antineoplastic radiation therapy: Secondary | ICD-10-CM | POA: Diagnosis not present

## 2023-03-24 DIAGNOSIS — C50212 Malignant neoplasm of upper-inner quadrant of left female breast: Secondary | ICD-10-CM | POA: Diagnosis not present

## 2023-03-24 DIAGNOSIS — C50411 Malignant neoplasm of upper-outer quadrant of right female breast: Secondary | ICD-10-CM | POA: Insufficient documentation

## 2023-03-24 LAB — RAD ONC ARIA SESSION SUMMARY
Course Elapsed Days: 14
Plan Fractions Treated to Date: 10
Plan Prescribed Dose Per Fraction: 2.67 Gy
Plan Total Fractions Prescribed: 15
Plan Total Prescribed Dose: 40.05 Gy
Reference Point Dosage Given to Date: 26.7 Gy
Reference Point Session Dosage Given: 2.67 Gy
Session Number: 10

## 2023-03-25 ENCOUNTER — Ambulatory Visit
Admission: RE | Admit: 2023-03-25 | Discharge: 2023-03-25 | Disposition: A | Discharge status: Home / Self Care | Payer: Medicare Other | Source: Ambulatory Visit | Attending: Radiation Oncology | Admitting: Radiation Oncology

## 2023-03-25 ENCOUNTER — Other Ambulatory Visit: Payer: Self-pay

## 2023-03-25 DIAGNOSIS — C50212 Malignant neoplasm of upper-inner quadrant of left female breast: Secondary | ICD-10-CM | POA: Diagnosis not present

## 2023-03-25 DIAGNOSIS — Z510 Encounter for antineoplastic radiation therapy: Secondary | ICD-10-CM | POA: Diagnosis not present

## 2023-03-25 DIAGNOSIS — Z170 Estrogen receptor positive status [ER+]: Secondary | ICD-10-CM | POA: Diagnosis not present

## 2023-03-25 DIAGNOSIS — C50411 Malignant neoplasm of upper-outer quadrant of right female breast: Secondary | ICD-10-CM | POA: Diagnosis not present

## 2023-03-25 LAB — RAD ONC ARIA SESSION SUMMARY
Course Elapsed Days: 15
Plan Fractions Treated to Date: 11
Plan Prescribed Dose Per Fraction: 2.67 Gy
Plan Total Fractions Prescribed: 15
Plan Total Prescribed Dose: 40.05 Gy
Reference Point Dosage Given to Date: 29.37 Gy
Reference Point Session Dosage Given: 2.67 Gy
Session Number: 11

## 2023-03-26 ENCOUNTER — Ambulatory Visit
Admission: RE | Admit: 2023-03-26 | Discharge: 2023-03-26 | Disposition: A | Discharge status: Home / Self Care | Payer: Medicare Other | Source: Ambulatory Visit | Attending: Radiation Oncology

## 2023-03-26 ENCOUNTER — Other Ambulatory Visit: Payer: Self-pay

## 2023-03-26 DIAGNOSIS — C50212 Malignant neoplasm of upper-inner quadrant of left female breast: Secondary | ICD-10-CM | POA: Diagnosis not present

## 2023-03-26 DIAGNOSIS — C50411 Malignant neoplasm of upper-outer quadrant of right female breast: Secondary | ICD-10-CM | POA: Diagnosis not present

## 2023-03-26 DIAGNOSIS — Z170 Estrogen receptor positive status [ER+]: Secondary | ICD-10-CM | POA: Diagnosis not present

## 2023-03-26 DIAGNOSIS — Z510 Encounter for antineoplastic radiation therapy: Secondary | ICD-10-CM | POA: Diagnosis not present

## 2023-03-26 LAB — RAD ONC ARIA SESSION SUMMARY
Course Elapsed Days: 16
Plan Fractions Treated to Date: 12
Plan Prescribed Dose Per Fraction: 2.67 Gy
Plan Total Fractions Prescribed: 15
Plan Total Prescribed Dose: 40.05 Gy
Reference Point Dosage Given to Date: 32.04 Gy
Reference Point Session Dosage Given: 2.67 Gy
Session Number: 12

## 2023-03-27 ENCOUNTER — Other Ambulatory Visit: Payer: Self-pay

## 2023-03-27 ENCOUNTER — Ambulatory Visit
Admission: RE | Admit: 2023-03-27 | Discharge: 2023-03-27 | Disposition: A | Discharge status: Home / Self Care | Payer: Medicare Other | Source: Ambulatory Visit | Attending: Radiation Oncology | Admitting: Radiation Oncology

## 2023-03-27 DIAGNOSIS — C50212 Malignant neoplasm of upper-inner quadrant of left female breast: Secondary | ICD-10-CM | POA: Diagnosis not present

## 2023-03-27 DIAGNOSIS — Z170 Estrogen receptor positive status [ER+]: Secondary | ICD-10-CM | POA: Diagnosis not present

## 2023-03-27 DIAGNOSIS — C50411 Malignant neoplasm of upper-outer quadrant of right female breast: Secondary | ICD-10-CM | POA: Diagnosis not present

## 2023-03-27 DIAGNOSIS — Z510 Encounter for antineoplastic radiation therapy: Secondary | ICD-10-CM | POA: Diagnosis not present

## 2023-03-27 LAB — RAD ONC ARIA SESSION SUMMARY
Course Elapsed Days: 17
Plan Fractions Treated to Date: 13
Plan Prescribed Dose Per Fraction: 2.67 Gy
Plan Total Fractions Prescribed: 15
Plan Total Prescribed Dose: 40.05 Gy
Reference Point Dosage Given to Date: 34.71 Gy
Reference Point Session Dosage Given: 2.67 Gy
Session Number: 13

## 2023-03-28 ENCOUNTER — Ambulatory Visit
Admission: RE | Admit: 2023-03-28 | Discharge: 2023-03-28 | Disposition: A | Discharge status: Home / Self Care | Payer: Medicare Other | Source: Ambulatory Visit | Attending: Radiation Oncology | Admitting: Radiation Oncology

## 2023-03-28 ENCOUNTER — Other Ambulatory Visit: Payer: Self-pay

## 2023-03-28 DIAGNOSIS — Z510 Encounter for antineoplastic radiation therapy: Secondary | ICD-10-CM | POA: Diagnosis not present

## 2023-03-28 DIAGNOSIS — C50411 Malignant neoplasm of upper-outer quadrant of right female breast: Secondary | ICD-10-CM

## 2023-03-28 DIAGNOSIS — C50212 Malignant neoplasm of upper-inner quadrant of left female breast: Secondary | ICD-10-CM | POA: Diagnosis not present

## 2023-03-28 DIAGNOSIS — Z170 Estrogen receptor positive status [ER+]: Secondary | ICD-10-CM | POA: Diagnosis not present

## 2023-03-28 LAB — RAD ONC ARIA SESSION SUMMARY
Course Elapsed Days: 18
Plan Fractions Treated to Date: 14
Plan Prescribed Dose Per Fraction: 2.67 Gy
Plan Total Fractions Prescribed: 15
Plan Total Prescribed Dose: 40.05 Gy
Reference Point Dosage Given to Date: 37.38 Gy
Reference Point Session Dosage Given: 2.67 Gy
Session Number: 14

## 2023-03-28 MED ORDER — RADIAPLEXRX EX GEL: Freq: Once | CUTANEOUS | Status: AC

## 2023-03-31 ENCOUNTER — Other Ambulatory Visit: Payer: Self-pay

## 2023-03-31 ENCOUNTER — Ambulatory Visit
Admission: RE | Admit: 2023-03-31 | Discharge: 2023-03-31 | Disposition: A | Discharge status: Home / Self Care | Payer: Medicare Other | Source: Ambulatory Visit | Attending: Radiation Oncology | Admitting: Radiation Oncology

## 2023-03-31 ENCOUNTER — Ambulatory Visit: Payer: Medicare Other

## 2023-03-31 DIAGNOSIS — C50212 Malignant neoplasm of upper-inner quadrant of left female breast: Secondary | ICD-10-CM | POA: Diagnosis not present

## 2023-03-31 DIAGNOSIS — Z170 Estrogen receptor positive status [ER+]: Secondary | ICD-10-CM | POA: Diagnosis not present

## 2023-03-31 DIAGNOSIS — C50411 Malignant neoplasm of upper-outer quadrant of right female breast: Secondary | ICD-10-CM | POA: Diagnosis not present

## 2023-03-31 DIAGNOSIS — Z510 Encounter for antineoplastic radiation therapy: Secondary | ICD-10-CM | POA: Diagnosis not present

## 2023-03-31 LAB — RAD ONC ARIA SESSION SUMMARY
Course Elapsed Days: 21
Plan Fractions Treated to Date: 15
Plan Prescribed Dose Per Fraction: 2.67 Gy
Plan Total Fractions Prescribed: 15
Plan Total Prescribed Dose: 40.05 Gy
Reference Point Dosage Given to Date: 40.05 Gy
Reference Point Session Dosage Given: 2.67 Gy
Session Number: 15

## 2023-04-01 ENCOUNTER — Inpatient Hospital Stay: Payer: Medicare Other | Attending: Hematology and Oncology | Admitting: Hematology and Oncology

## 2023-04-01 ENCOUNTER — Ambulatory Visit: Payer: Medicare Other

## 2023-04-01 VITALS — BP 145/58 | HR 67 | Temp 98.1°F | Resp 18 | Ht 61.0 in | Wt 159.9 lb

## 2023-04-01 DIAGNOSIS — Z923 Personal history of irradiation: Secondary | ICD-10-CM | POA: Diagnosis not present

## 2023-04-01 DIAGNOSIS — Z79811 Long term (current) use of aromatase inhibitors: Secondary | ICD-10-CM | POA: Diagnosis not present

## 2023-04-01 DIAGNOSIS — Z510 Encounter for antineoplastic radiation therapy: Secondary | ICD-10-CM | POA: Diagnosis not present

## 2023-04-01 DIAGNOSIS — Z170 Estrogen receptor positive status [ER+]: Secondary | ICD-10-CM | POA: Diagnosis not present

## 2023-04-01 DIAGNOSIS — C50411 Malignant neoplasm of upper-outer quadrant of right female breast: Secondary | ICD-10-CM | POA: Insufficient documentation

## 2023-04-01 DIAGNOSIS — C50212 Malignant neoplasm of upper-inner quadrant of left female breast: Secondary | ICD-10-CM | POA: Diagnosis not present

## 2023-04-01 MED ORDER — ANASTROZOLE 1 MG PO TABS: 1.0000 mg | ORAL_TABLET | Freq: Every day | ORAL | 3 refills | Status: DC

## 2023-04-01 NOTE — Progress Notes (Signed)
Patient Care Team: Garlan Fillers, MD as PCP - General (Internal Medicine) Pershing Proud, RN as Oncology Nurse Navigator Donnelly Angelica, RN as Oncology Nurse Navigator Serena Croissant, MD as Consulting Physician (Hematology and Oncology) Lonie Peak, MD as Attending Physician (Radiation Oncology) [name], MD as Consulting Physician (General Surgery)  DIAGNOSIS:  Encounter Diagnosis  Name Primary?   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  Report date is 01/20/2023.   The CancerNext-Expanded gene panel offered by [facility] and includes sequencing, rearrangement, and RNA analysis for the following 71 genes:  AIP, ALK, APC, ATM, BAP1, BARD1, BMPR1A, BRCA1, BRCA2, BRIP1, CDC73, CDH1, CDK4, CDKN1B, CDKN2A, CHEK2, DICER1, FH, FLCN, KIF1B, LZTR1,MAX, MEN1, MET, MLH1, MSH2, MSH6, MUTYH, NF1, NF2, NTHL1, PALB2, PHOX2B, PMS2, POT1, PRKAR1A, PTCH1, PTEN, RAD51C,RAD51D, RB1, RET, SDHA, SDHAF2, SDHB, SDHC, SDHD, SMAD4, SMARCA4, SMARCB1, SMARCE1, STK11, SUFU, TMEM127, TP53,TSC1, TSC2 and VHL (sequencing and deletion/duplication); AXIN2, CTNNA1, EGFR, EGLN1, HOXB13, KIT, MITF, MSH3, PDGFRA, POLD1 and POLE (sequencing only); EPCAM and GREM1 (deletion/duplication only).    03/11/2023 - 03/31/2023 Radiation Therapy   Adjuvant radiation   04/23/2023 -  Anti-estrogen oral therapy   Adjuvant anastrozole x 5 years   Malignant  neoplasm of upper-outer quadrant of right breast in female, estrogen receptor positive (HCC)  12/31/2022 Initial Diagnosis   Screening mammogram detected adjacent right breast masses (12 o'clock position) 1.1 cm, axilla negative, biopsy: Grade 2 IDC ER 100%, PR 30%, HER2 1+ negative, Ki-67 20% (Left breast cancer 2014: Treated with surgery, radiation)   01/08/2023 Cancer Staging   Staging form: Breast, AJCC 8th Edition - Clinical: Stage IA (cT1c, cN0, cM0, G2, ER+, PR+, HER2-) - Signed by Serena Croissant, MD on 01/08/2023 Histologic grading system: 3 grade system   01/20/2023 Genetic Testing   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  Report date is 01/20/2023.   The CancerNext-Expanded gene panel offered by [facility] and includes sequencing, rearrangement, and RNA analysis for the following 71 genes:  AIP, ALK, APC, ATM, BAP1, BARD1, BMPR1A, BRCA1, BRCA2, BRIP1, CDC73, CDH1, CDK4, CDKN1B, CDKN2A, CHEK2, DICER1, FH, FLCN, KIF1B, LZTR1,MAX, MEN1, MET, MLH1, MSH2, MSH6, MUTYH, NF1, NF2, NTHL1, PALB2, PHOX2B, PMS2, POT1, PRKAR1A, PTCH1, PTEN, RAD51C,RAD51D, RB1, RET, SDHA, SDHAF2, SDHB, SDHC, SDHD, SMAD4, SMARCA4, SMARCB1, SMARCE1, STK11, SUFU, TMEM127, TP53,TSC1, TSC2 and VHL (sequencing and deletion/duplication); AXIN2, CTNNA1, EGFR, EGLN1, HOXB13, KIT, MITF, MSH3, PDGFRA, POLD1 and POLE (sequencing only); EPCAM and GREM1 (deletion/duplication only).      CHIEF COMPLIANT: Follow-up after radiation therapy is completed  HISTORY OF PRESENT ILLNESS:  History of Present Illness   Ama, a patient with a history of breast cancer, recently completed radiation therapy. She reports low energy levels post-treatment, but no soreness in the treated area. She has not experienced any other side effects from the radiation. She has not had a bone density test in the last few years. She is currently taking vitamin D and calcium supplements. The patient's medication list  was updated,  with Robaxin and Xarelto being removed.         ALLERGIES:  is allergic to augmentin [amoxicillin-pot clavulanate] and codeine.  MEDICATIONS:  Current Outpatient Medications  Medication Sig Dispense Refill   anastrozole (ARIMIDEX) 1 MG tablet Take 1 tablet (1 mg total) by mouth daily. 90 tablet 3   acetaminophen (TYLENOL) 500 MG tablet Take 1,000 mg by mouth at bedtime.     aspirin EC 81 MG tablet Take 81 mg by mouth daily.     atorvastatin (LIPITOR) 10 MG tablet Take 10 mg by mouth daily.       Calcium Carbonate-Vitamin D (CALTRATE 600+D PO) Take 1 tablet by mouth daily.     carvedilol (COREG) 25 MG tablet Take 25 mg by mouth 2 (two) times daily with a meal.     Cholecalciferol (VITAMIN D3) 10 MCG (400 UNIT) CAPS Take 400 Units by mouth daily.     cycloSPORINE (RESTASIS) 0.05 % ophthalmic emulsion Place 1 drop into both eyes 2 (two) times daily.     gabapentin (NEURONTIN) 300 MG capsule Take 300 mg by mouth at bedtime.     losartan (COZAAR) 50 MG tablet Take 50 mg by mouth daily.     Multiple Vitamin (MULTIVITAMIN WITH MINERALS) TABS tablet Take 1 tablet by mouth daily.     NONFORMULARY OR COMPOUNDED ITEM Apply 1 Application topically in the morning and at bedtime. Ketoprofen 10%, Lidocaine 5% , Gabapentin 4%, Cyclobenzaprine 2%     Polyethyl Glyc-Propyl Glyc PF (SYSTANE PRESERVATIVE FREE) 0.4-0.3 % SOLN Place 1 drop into both eyes 3 (three) times daily as needed (dry eyes).     potassium chloride SA (K-DUR,KLOR-CON) 20 MEQ tablet Take 20 mEq by mouth 2 (two) times daily.     sodium chloride (MURO 128) 5 % ophthalmic ointment Place 1 Application into both eyes every other day.     triamterene-hydrochlorothiazide (MAXZIDE) 75-50 MG per tablet Take 0.5 tablets by mouth daily.      venlafaxine XR (EFFEXOR-XR) 37.5 MG 24 hr capsule TAKE 1 CAPSULE BY MOUTH TWICE A DAY 180 capsule 3   verapamil (VERELAN PM) 180 MG 24 hr capsule Take 180 mg by mouth every morning.      No current  facility-administered medications for this visit.   Facility-Administered Medications Ordered in Other Visits  Medication Dose Route Frequency Provider Last Rate Last Admin   topical emolient (BIAFINE) emulsion   Topical PRN Lurline Hare, MD   Given at 02/03/13 [phone]    PHYSICAL EXAMINATION: ECOG PERFORMANCE STATUS: 1 - Symptomatic but completely ambulatory  Vitals:   04/01/23 1330  BP: (!) 145/58  Pulse: 67  Resp: 18  Temp: 98.1 F (36.7 C)  SpO2: 97%   Filed Weights   04/01/23 1330  Weight: 159 lb 14.4 oz (72.5 kg)      LABORATORY DATA:  I have reviewed the data as listed    Latest Ref Rng & Units 01/08/2023   12:23 PM 03/10/2018    4:45 AM 03/04/2018   12:26 PM  CMP  Glucose 70 - 99 mg/dL 97  528  79   BUN 8 - 23 mg/dL 22  17  21    Creatinine 0.44 - 1.00 mg/dL 4.13  2.44  0.10   Sodium 135 - 145 mmol/L 142  139  140   Potassium 3.5 - 5.1 mmol/L 4.0  3.1  4.3   Chloride 98 - 111 mmol/L 106  103  102   CO2 22 -  32 mmol/L 30  28  31    Calcium 8.9 - 10.3 mg/dL 9.8  8.2  9.6   Total Protein 6.5 - 8.1 g/dL 6.9   7.0   Total Bilirubin 0.3 - 1.2 mg/dL 0.6   0.8   Alkaline Phos 38 - 126 U/L 78   89   AST 15 - 41 U/L 17   23   ALT 0 - 44 U/L 14   19     Lab Results  Component Value Date   WBC 5.1 01/08/2023   HGB 12.6 01/08/2023   HCT 38.1 01/08/2023   MCV 95.7 01/08/2023   PLT 170 01/08/2023   NEUTROABS 3.3 01/08/2023    ASSESSMENT & PLAN:  Malignant neoplasm of upper-inner quadrant of left breast in female, estrogen receptor positive (HCC) 12/31/2022: Screening mammogram detected adjacent right breast masses (12 o'clock position) 1.1 cm, axilla negative, biopsy: Grade 2 IDC ER 100%, PR 30%, HER2 1+ negative, Ki-67 20% (Left breast cancer 2014: Treated with surgery, radiation)   Treatment plan: 01/23/2023: Rt Lumpectomy: Grade 2 IDC 1.6 cm with focal DCIS intermediate grade, margins negative, negative for lymphovascular invasion, ER 100%, PR 30%, Ki-67 20%,  HER2 negative 1+ Radiation: 03/11/2023-03/31/2023 Antiestrogen therapy with anastrozole 1 mg daily x 5 years to start 04/23/2023  Anastrozole counseling: We discussed the risks and benefits of anti-estrogen therapy with aromatase inhibitors. These include but not limited to insomnia, hot flashes, mood changes, vaginal dryness, bone density loss, and weight gain. We strongly believe that the benefits far outweigh the risks. Patient understands these risks and consented to starting treatment. Planned treatment duration is 5 years.  Return to clinic in 3 months for survivorship care plan visit  -Next appointment in 3 months to discuss survivorship and assess tolerance to Anastrozole.  Bone Health Discussed potential risk of osteoporosis with long-term Anastrozole use. No recent bone density scan. -Order bone density scan at the breast center in the next 3-4 months. -Continue current Vitamin D and Calcium supplementation. -Encourage regular walking for bone health.  Medication Review Patient reported not taking Robaxin and Xarelto. -Remove Robaxin and Xarelto from medication list.          No orders of the defined types were placed in this encounter.  The patient has a good understanding of the overall plan. she agrees with it. she will call with any problems that may develop before the next visit here. Total time spent: 30 mins including face to face time and time spent for planning, charting and co-ordination of care   Tamsen Meek, MD 04/01/23

## 2023-04-01 NOTE — Assessment & Plan Note (Signed)
12/31/2022: Screening mammogram detected adjacent right breast masses (12 o'clock position) 1.1 cm, axilla negative, biopsy: Grade 2 IDC ER 100%, PR 30%, HER2 1+ negative, Ki-67 20% (Left breast cancer 2014: Treated with surgery, radiation)   Treatment plan: 01/23/2023: Rt Lumpectomy: Grade 2 IDC 1.6 cm with focal DCIS intermediate grade, margins negative, negative for lymphovascular invasion, ER 100%, PR 30%, Ki-67 20%, HER2 negative 1+ Radiation: 03/11/2023-03/31/2023 Antiestrogen therapy with anastrozole 1 mg daily x 5 years to start 04/23/2023  Anastrozole counseling: We discussed the risks and benefits of anti-estrogen therapy with aromatase inhibitors. These include but not limited to insomnia, hot flashes, mood changes, vaginal dryness, bone density loss, and weight gain. We strongly believe that the benefits far outweigh the risks. Patient understands these risks and consented to starting treatment. Planned treatment duration is 5 years.  Return to clinic in 3 months for survivorship care plan visit

## 2023-04-01 NOTE — Radiation Completion Notes (Signed)
Patient Name: Teresa Chapman, Teresa Chapman MRN: [id number] Date of Birth: 1940-10-28 Referring Physician: Jarome Matin, M.D. Date of Service: 2023-04-01 Radiation Oncologist: Lonie Peak, M.D. [facility] [place]                             RADIATION ONCOLOGY END OF TREATMENT NOTE     Diagnosis: C50.411 Malignant neoplasm of upper-outer quadrant of right female breast Staging on 2012-11-04: Malignant neoplasm of upper-inner quadrant of left breast in female, estrogen receptor positive (HCC) T=cT1c, N=cN0, M=cM0 Staging on 2023-01-08: Malignant neoplasm of upper-outer quadrant of right breast in female, estrogen receptor positive (HCC) T=cT1c, N=cN0, M=cM0 Intent: Curative     ==========DELIVERED PLANS==========  First Treatment Date: 2023-03-10 Last Treatment Date: 2023-03-31   Plan Name: Breast_R Site: Breast, Right Technique: 3D Mode: Photon Dose Per Fraction: 2.67 Gy Prescribed Dose (Delivered / Prescribed): 40.05 Gy / 40.05 Gy Prescribed Fxs (Delivered / Prescribed): 15 / 15     ==========ON TREATMENT VISIT DATES========== 2023-03-10, 2023-03-17, 2023-03-24, 2023-03-31     ==========UPCOMING VISITS========== 05/01/2023 [facility]-RADIATION ONC FOLLOW UP 20 Lonie Peak, MD  04/01/2023 [facility]-MED ONCOLOGY EST PT 15 Serena Croissant, MD        ==========APPENDIX - ON TREATMENT VISIT NOTES==========   See weekly On Treatment Notes in Epic for details in the Media tab (listed as Progress notes on the On Treatment Visit Dates listed above).

## 2023-05-01 ENCOUNTER — Encounter: Payer: Self-pay | Admitting: Radiation Oncology

## 2023-05-01 ENCOUNTER — Ambulatory Visit
Admission: RE | Admit: 2023-05-01 | Discharge: 2023-05-01 | Disposition: A | Discharge status: Home / Self Care | Payer: Medicare Other | Source: Ambulatory Visit | Attending: Radiation Oncology | Admitting: Radiation Oncology

## 2023-05-01 NOTE — Progress Notes (Signed)
 Teresa Chapman had a telephone follow up  post radiation to the breast.   Breast Side:Right breast   They completed their radiation on: 03/31/2023   Does the patient complain of any of the following: Post radiation skin issues: No Breast Tenderness: No Breast Swelling: No Lymphadema: No Range of Motion limitations: No  Fatigue post radiation: Yes to have mild fatigue.  Appetite good/fair/poor: good  Additional comments if applicable:  Patient has started taking anastrozole .   Patient reports she is doing well. No issues or concerns at this time.

## 2023-07-14 ENCOUNTER — Encounter: Payer: Self-pay | Admitting: *Deleted

## 2023-07-14 ENCOUNTER — Inpatient Hospital Stay: Payer: Medicare Other | Attending: Adult Health | Admitting: Adult Health

## 2023-07-14 ENCOUNTER — Other Ambulatory Visit: Payer: Self-pay

## 2023-07-14 VITALS — BP 154/66 | HR 69 | Temp 98.3°F | Resp 18 | Ht 61.0 in | Wt 158.7 lb

## 2023-07-14 DIAGNOSIS — Z170 Estrogen receptor positive status [ER+]: Secondary | ICD-10-CM | POA: Diagnosis not present

## 2023-07-14 DIAGNOSIS — Z79811 Long term (current) use of aromatase inhibitors: Secondary | ICD-10-CM | POA: Diagnosis not present

## 2023-07-14 DIAGNOSIS — C50212 Malignant neoplasm of upper-inner quadrant of left female breast: Secondary | ICD-10-CM | POA: Insufficient documentation

## 2023-07-14 DIAGNOSIS — Z923 Personal history of irradiation: Secondary | ICD-10-CM | POA: Diagnosis not present

## 2023-07-14 DIAGNOSIS — Z13820 Encounter for screening for osteoporosis: Secondary | ICD-10-CM | POA: Diagnosis not present

## 2023-07-14 DIAGNOSIS — C50411 Malignant neoplasm of upper-outer quadrant of right female breast: Secondary | ICD-10-CM | POA: Diagnosis not present

## 2023-07-14 NOTE — Progress Notes (Unsigned)
 SURVIVORSHIP VISIT:  BRIEF ONCOLOGIC HISTORY:  Oncology History  Malignant neoplasm of upper-inner quadrant of left breast in female, estrogen receptor positive (HCC)  11/04/2012 Cancer Staging   Staging form: Breast, AJCC 8th Edition - Clinical: Stage IA (cT1c, cN0, cM0, G2, ER+, PR+, HER2-) - Signed by Serena Croissant, MD on 01/08/2023 Stage prefix: Initial diagnosis Histologic grading system: 3 grade system   01/20/2023 Genetic Testing   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  Report date is 01/20/2023.   The CancerNext-Expanded gene panel offered by [facility] and includes sequencing, rearrangement, and RNA analysis for the following 71 genes:  AIP, ALK, APC, ATM, BAP1, BARD1, BMPR1A, BRCA1, BRCA2, BRIP1, CDC73, CDH1, CDK4, CDKN1B, CDKN2A, CHEK2, DICER1, FH, FLCN, KIF1B, LZTR1,MAX, MEN1, MET, MLH1, MSH2, MSH6, MUTYH, NF1, NF2, NTHL1, PALB2, PHOX2B, PMS2, POT1, PRKAR1A, PTCH1, PTEN, RAD51C,RAD51D, RB1, RET, SDHA, SDHAF2, SDHB, SDHC, SDHD, SMAD4, SMARCA4, SMARCB1, SMARCE1, STK11, SUFU, TMEM127, TP53,TSC1, TSC2 and VHL (sequencing and deletion/duplication); AXIN2, CTNNA1, EGFR, EGLN1, HOXB13, KIT, MITF, MSH3, PDGFRA, POLD1 and POLE (sequencing only); EPCAM and GREM1 (deletion/duplication only).    03/11/2023 - 03/31/2023 Radiation Therapy   Adjuvant radiation   04/23/2023 -  Anti-estrogen oral therapy   Adjuvant anastrozole x 5 years   Malignant neoplasm of upper-outer quadrant of right breast in female, estrogen receptor positive (HCC)  12/31/2022 Initial Diagnosis   Screening mammogram detected adjacent right breast masses (12 o'clock position) 1.1 cm, axilla negative, biopsy: Grade 2 IDC ER 100%, PR 30%, HER2 1+ negative, Ki-67 20% (Left breast cancer 2014: Treated with surgery, radiation)   01/08/2023 Cancer Staging   Staging form: Breast, AJCC 8th Edition - Clinical: Stage IA (cT1c, cN0, cM0, G2, ER+, PR+, HER2-) - Signed by Serena Croissant, MD on  01/08/2023 Histologic grading system: 3 grade system   01/20/2023 Genetic Testing   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  Report date is 01/20/2023.   The CancerNext-Expanded gene panel offered by [facility] and includes sequencing, rearrangement, and RNA analysis for the following 71 genes:  AIP, ALK, APC, ATM, BAP1, BARD1, BMPR1A, BRCA1, BRCA2, BRIP1, CDC73, CDH1, CDK4, CDKN1B, CDKN2A, CHEK2, DICER1, FH, FLCN, KIF1B, LZTR1,MAX, MEN1, MET, MLH1, MSH2, MSH6, MUTYH, NF1, NF2, NTHL1, PALB2, PHOX2B, PMS2, POT1, PRKAR1A, PTCH1, PTEN, RAD51C,RAD51D, RB1, RET, SDHA, SDHAF2, SDHB, SDHC, SDHD, SMAD4, SMARCA4, SMARCB1, SMARCE1, STK11, SUFU, TMEM127, TP53,TSC1, TSC2 and VHL (sequencing and deletion/duplication); AXIN2, CTNNA1, EGFR, EGLN1, HOXB13, KIT, MITF, MSH3, PDGFRA, POLD1 and POLE (sequencing only); EPCAM and GREM1 (deletion/duplication only).    03/10/2023 - 03/31/2023 Radiation Therapy   Plan Name: Breast_R Site: Breast, Right Technique: 3D Mode: Photon Dose Per Fraction: 2.67 Gy Prescribed Dose (Delivered / Prescribed): 40.05 Gy / 40.05 Gy Prescribed Fxs (Delivered / Prescribed): 15 / 15   04/2023 -  Anti-estrogen oral therapy   1 mg Anastrozole daily x 5 years     INTERVAL HISTORY:  Teresa Chapman to review her survivorship care plan detailing her treatment course for breast cancer, as well as monitoring long-term side effects of that treatment, education regarding health maintenance, screening, and overall wellness and health promotion.     Overall, Teresa Chapman reports feeling quite well.  She is taking anastrozole daily with good tolerance.  Her most recent bone density testing occurred January 24, 2017 demonstrating osteopenia with a T-score -1.9 in the left femur.  REVIEW OF SYSTEMS:  Review of Systems  Constitutional:  Negative for appetite change, chills, fatigue, fever and unexpected weight change.  HENT:  Negative for hearing loss, lump/mass and  trouble swallowing.   Eyes:  Negative for eye problems and icterus.  Respiratory:  Negative for chest tightness, cough and shortness of breath.   Cardiovascular:  Negative for chest pain, leg swelling and palpitations.  Gastrointestinal:  Negative for abdominal distention, abdominal pain, constipation, diarrhea, nausea and vomiting.  Endocrine: Negative for hot flashes.  Genitourinary:  Negative for difficulty urinating.   Musculoskeletal:  Negative for arthralgias.  Skin:  Negative for itching and rash.  Neurological:  Negative for dizziness, extremity weakness, headaches and numbness.  Hematological:  Negative for adenopathy. Does not bruise/bleed easily.  Psychiatric/Behavioral:  Negative for depression. The patient is not nervous/anxious.    Breast: Denies any new nodularity, masses, tenderness, nipple changes, or nipple discharge.       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,  no treatment"   History of basal cell carcinoma (BCC) excision    face ,  right lower extremity   History of colon polyps    History of DVT of lower extremity    RLE  yrs ago   History of external beam radiation therapy 01-13-2013  to 02-09-2013   left breast   History of squamous cell carcinoma excision    abdomen   Hyperlipidemia    Hypertension    Insomnia    infreqently takes Lunesta   Lung cancer [facility]) 2009   10-16-2007  s/p  right VATS with wedge resection right upper lobe posterior section/  low grade neuroendocrine tumor and noncaseating granuloma (last Chest CT 08-14-2010  no recurrence with stable bilateral pulm. nodules)  per pt no further treatment , followed by pcp   Malignant neoplasm of upper-inner quadrant of left breast in female, estrogen receptor positive [facility])  ONCOLOGIST-- DR Darnelle Catalan   dx 07/ 2014-- Invasive Ductal carcinoma, Stage IIA, Grade 1 (pT2pN0)-- 11-18-2012  s/p  left breast lumpectomy with sln dissection re-excsion left breast 12-10-2012,  completed radiation 02-09-2013, started antiestrogen therapy 02-24-2013   Myocardial bridge    per cardiac cath 03-26-2010  LAD tortousity with intramyocardial bridge   Osteoarthritis    ,  knees, thumbs   Osteopenia    Personal history of radiation therapy 2014   left breast cancer   PONV (postoperative nausea and vomiting)    Scoliosis    SUI (stress urinary incontinence, female)    Wears glasses    Wears glasses    Past Surgical History:  Procedure Laterality Date   BASAL CELL CARCINOMA EXCISION     "right face; RLE" (11/18/2012)   BREAST BIOPSY Right ~ 1962   "benign"    BREAST BIOPSY Right 10/2012   "benign cyst"    BREAST BIOPSY Left 10/2012   BREAST BIOPSY Right 12/31/2022   [place] RT BREAST BX W LOC DEV 1ST LESION IMG BX SPEC US GUIDE 12/31/2022 [facility]   BREAST BIOPSY  01/22/2023   MM RT RADIOACTIVE SEED LOC MAMMO GUIDE 01/22/2023 [facility]   BREAST EXCISIONAL BIOPSY Right    BREAST LUMPECTOMY Left 2014   BREAST LUMPECTOMY WITH NEEDLE LOCALIZATION AND AXILLARY SENTINEL LYMPH NODE BX Left 11/18/2012   Procedure: LEFT BREAST LUMPECTOMY WITH NEEDLE LOCALIZATION AND AXILLARY SENTINEL LYMPH NODE BX;  Surgeon: Emelia Loron, MD;  Location: [facility] OR;  Service: General;  Laterality: Left;   BREAST LUMPECTOMY WITH RADIOACTIVE SEED LOCALIZATION Right 01/23/2023   Procedure: RADIOACTIVE SEED GUIDED RIGHT BREAST LUMPECTOMY;  Surgeon: [name], MD;  Location: [facility] OR;  Service: General;  Laterality: Right;   CARDIAC CATHETERIZATION  03-26-2010   dr Jacinto Halim   normal coronaries,  LAD tortousity with intramyocardial bridge,  normal LVF, ef 65-70%   CHOLECYSTECTOMY OPEN  1970s   AND APPENDECTOMY   COLONOSCOPY     "I've had a few; last one was 2010" (11/18/2012)   DILATION AND  CURETTAGE OF UTERUS  yrs ago   LUNG BIOPSY Right 2009   RE-EXCISION OF BREAST LUMPECTOMY Left 12/10/2012   Procedure: RE-EXCISION OF BREAST LUMPECTOMY;  Surgeon: Emelia Loron, MD;  Location: [facility];  Service: General;  Laterality: Left;   SQUAMOUS CELL CARCINOMA EXCISION     "?abdomen" (11/18/2012)   THYMECTOMY  ~ 1994   "benign growth"   TONSILLECTOMY AND ADENOIDECTOMY  age 40   TOTAL KNEE ARTHROPLASTY Left 03/09/2018   Procedure: LEFT TOTAL KNEE ARTHROPLASTY-ADDUCTOR BLOCK;  Surgeon: Ollen Gross, MD;  Location: [facility];  Service: Orthopedics;  Laterality: Left;    VAGINAL HYSTERECTOMY  1974   retained ovaries   VIDEO ASSISTED THORACOSCOPY (VATS)/WEDGE RESECTION Right 10-16-2007   dr Edwyna Shell  @[facility]    posterior section right upper lobe (minithoracotomy)     ALLERGIES:  Allergies  Allergen Reactions   Augmentin [Amoxicillin-Pot Clavulanate] Rash   Codeine Itching and Rash    REACTION: hyperactivity, itching     CURRENT MEDICATIONS:  Outpatient Encounter Medications as of 07/14/2023  Medication Sig   acetaminophen (TYLENOL) 500 MG tablet Take 1,000 mg by mouth at bedtime.   anastrozole (ARIMIDEX) 1 MG tablet Take 1 tablet (1 mg total) by mouth daily.   aspirin EC 81 MG tablet Take 81 mg by mouth daily.   atorvastatin (LIPITOR) 10 MG tablet Take 10 mg by mouth daily.     Calcium Carbonate-Vitamin D (CALTRATE 600+D PO) Take 1 tablet by mouth daily.   carvedilol (COREG) 25 MG tablet Take 25 mg by mouth 2 (two) times daily with a meal.   Cholecalciferol (VITAMIN D3) 10 MCG (400 UNIT) CAPS Take 400 Units by mouth daily.   cycloSPORINE (RESTASIS) 0.05 % ophthalmic emulsion Place 1 drop into both eyes 2 (two) times daily.   gabapentin (NEURONTIN) 300 MG capsule Take 300 mg by mouth at bedtime.   losartan (COZAAR) 50 MG tablet Take 50 mg by mouth daily.   Multiple Vitamin (MULTIVITAMIN WITH MINERALS) TABS tablet Take 1 tablet by mouth daily.   NONFORMULARY OR  COMPOUNDED ITEM Apply 1 Application topically in the morning and at bedtime. Ketoprofen 10%, Lidocaine 5% , Gabapentin 4%, Cyclobenzaprine 2%   Polyethyl Glyc-Propyl Glyc PF (SYSTANE PRESERVATIVE FREE) 0.4-0.3 % SOLN Place 1 drop into both eyes 3 (three) times daily as needed (dry eyes).   potassium chloride SA (K-DUR,KLOR-CON) 20 MEQ tablet Take 20 mEq by mouth 2 (two) times daily.   sodium chloride (MURO 128) 5 % ophthalmic ointment Place 1 Application into both eyes every other day.   triamterene-hydrochlorothiazide (MAXZIDE) 75-50 MG per tablet Take 0.5 tablets by mouth daily.    venlafaxine XR (EFFEXOR-XR) 37.5 MG 24 hr capsule TAKE 1 CAPSULE BY MOUTH TWICE A DAY   verapamil (VERELAN PM) 180 MG 24 hr capsule Take 180 mg by mouth every morning.    Facility-Administered Encounter Medications as of 07/14/2023  Medication  topical emolient (BIAFINE) emulsion     ONCOLOGIC FAMILY HISTORY:  Family History  Problem Relation Age of Onset   Hypertension Mother    Colon cancer Mother 14   Breast cancer Mother        dx 13s   Heart disease Father    Breast cancer Maternal Aunt        dx >50   Breast cancer Cousin        x2 mat female cousins; one dx >50 and other dx unk age   Breast cancer Cousin        x2 pat female cousins; dx >50     SOCIAL HISTORY:  Social History   Socioeconomic History   Marital status: Married    Spouse name: Not on file   Number of children: Not on file   Years of education: Not on file   Highest education level: Not on file  Occupational History   Not on file  Tobacco Use   Smoking status: Never   Smokeless tobacco: Never  Vaping Use   Vaping status: Never Used  Substance and Sexual Activity   Alcohol use: Yes    Comment: special occasions   Drug use: Never   Sexual activity: Yes    Birth control/protection: Post-menopausal, Surgical    Comment: menarche age 66, menopause early 63's, P40, 1st live birth age 62, HRT x 8-10 yrs  Other Topics  Concern   Not on file  Social History Narrative   Not on file   Social Drivers of Health   Financial Resource Strain: Not on file  Food Insecurity: No Food Insecurity (01/08/2023)   Hunger Vital Sign    Worried About Running Out of Food in the Last Year: Never true    Ran Out of Food in the Last Year: Never true  Transportation Needs: No Transportation Needs (01/08/2023)   PRAPARE - Administrator, Civil Service (Medical): No    Lack of Transportation (Non-Medical): No  Physical Activity: Not on file  Stress: Not on file  Social Connections: Not on file  Intimate Partner Violence: Not At Risk (01/08/2023)   Humiliation, Afraid, Rape, and Kick questionnaire    Fear of Current or Ex-Partner: No    Emotionally Abused: No    Physically Abused: No    Sexually Abused: No     OBSERVATIONS/OBJECTIVE:  BP (!) 154/66 (BP Location: Left Arm, Patient Position: Sitting) Comment: 144/63 LA  Pulse 69   Temp 98.3 F (36.8 C) (Tympanic)   Resp 18   Ht 5\' 1"  (1.549 m)   Wt 158 lb 11.2 oz (72 kg)   SpO2 97%   BMI 29.99 kg/m  GENERAL: Patient is a well appearing female in no acute distress HEENT:  Sclerae anicteric.  Oropharynx clear and moist. No ulcerations or evidence of oropharyngeal candidiasis. Neck is supple.  NODES:  No cervical, supraclavicular, or axillary lymphadenopathy palpated.  BREAST EXAM: Left breast status post lumpectomy and radiation no sign of local recurrence right breast status postlumpectomy and radiation no sign of local recurrence LUNGS:  Clear to auscultation bilaterally.  No wheezes or rhonchi. HEART:  Regular rate and rhythm. No murmur appreciated. ABDOMEN:  Soft, nontender.  Positive, normoactive bowel sounds. No organomegaly palpated. MSK:  No focal spinal tenderness to palpation. Full range of motion bilaterally in the upper extremities. EXTREMITIES:  No peripheral edema.   SKIN:  Clear with no obvious rashes or skin changes. No nail  dyscrasia. NEURO:  Nonfocal. Well oriented.  Appropriate affect.   LABORATORY DATA:  None for this visit.  DIAGNOSTIC IMAGING:  None for this visit.      ASSESSMENT AND PLAN:  Ms.. Chapman is a pleasant 83 y.o. female with Stage IA right breast invasive ductal carcinoma, ER+/PR+/HER2-, diagnosed in 12/2022, treated with lumpectomy, adjuvant radiation therapy, and anti-estrogen therapy with Anastrozole beginning in 04/2023.  She presents to the Survivorship Clinic for our initial meeting and routine follow-up post-completion of treatment for breast cancer.    1. Stage IA right breast cancer:  Teresa Chapman is continuing to recover from definitive treatment for breast cancer. She will follow-up with her medical oncologist, Dr. Pamelia Hoit in 6 months with history and physical exam per surveillance protocol.  She will continue her anti-estrogen therapy with Anastrozole. Thus far, she is tolerating the Anastrozole well, with minimal side effects. Her mammogram is due 92025; orders placed today.     Today, a comprehensive survivorship care plan and treatment summary was reviewed with the patient today detailing her breast cancer diagnosis, treatment course, potential late/long-term effects of treatment, appropriate follow-up care with recommendations for the future, and patient education resources.  A copy of this summary, along with a letter will be sent to the patient's primary care provider via mail/fax/In Basket message after today's visit.    2. Bone health:  Given Teresa Chapman's age/history of breast cancer and her current treatment regimen including anti-estrogen therapy with Anastrozole, she is at risk for bone demineralization.  Her last DEXA scan occurred in 2018.  Repeat testing has been ordered.  She was given education on specific activities to promote bone health.  3. Cancer screening:  Due to Teresa Chapman's history and her age, she should receive screening for skin cancers.  The information and  recommendations are listed on the patient's comprehensive care plan/treatment summary and were reviewed in detail with the patient.    4. Health maintenance and wellness promotion: Teresa Chapman was encouraged to consume 5-7 servings of fruits and vegetables per day. We reviewed the "Nutrition Rainbow" handout.  She was also encouraged to engage in moderate to vigorous exercise for 30 minutes per day most days of the week.  She was instructed to limit her alcohol consumption and continue to abstain from tobacco use.     5. Support services/counseling: It is not uncommon for this period of the patient's cancer care trajectory to be one of many emotions and stressors.   She was given information regarding our available services and encouraged to contact me with any questions or for help enrolling in any of our support group/programs.    Follow up instructions:    -Return to cancer center in 6 months for follow-up with Dr. Pamelia Hoit -Mammogram due in 12/2023 -She is welcome to return back to the Survivorship Clinic at any time; no additional follow-up needed at this time.  -Consider referral back to survivorship as a long-term survivor for continued surveillance  The patient was provided an opportunity to ask questions and all were answered. The patient agreed with the plan and demonstrated an understanding of the instructions.   Total encounter time:45 minutes*in face-to-face visit time, chart review, lab review, care coordination, order entry, and documentation of the encounter time.    Lillard Anes, NP 07/14/23 12:56 PM Medical Oncology and Hematology [facility] [address] [place], [place] [address] Tel. [phone]    Fax. [phone]  *Total Encounter Time as defined by the Centers for Medicare and  Medicaid Services includes, in addition to the face-to-face time of a patient visit (documented in the note above) non-face-to-face time: obtaining and reviewing outside history,  ordering and reviewing medications, tests or procedures, care coordination (communications with other health care professionals or caregivers) and documentation in the medical record.

## 2023-07-15 ENCOUNTER — Telehealth: Payer: Self-pay | Admitting: Adult Health

## 2023-07-15 ENCOUNTER — Encounter: Payer: Self-pay | Admitting: Adult Health

## 2023-07-15 NOTE — Telephone Encounter (Signed)
 Scheduled appointment per 3/24 los. Talked with the patient and she is aware of the made appointment.

## 2023-07-31 DIAGNOSIS — M8580 Other specified disorders of bone density and structure, unspecified site: Secondary | ICD-10-CM | POA: Diagnosis not present

## 2023-07-31 DIAGNOSIS — E785 Hyperlipidemia, unspecified: Secondary | ICD-10-CM | POA: Diagnosis not present

## 2023-07-31 DIAGNOSIS — I10 Essential (primary) hypertension: Secondary | ICD-10-CM | POA: Diagnosis not present

## 2023-08-07 DIAGNOSIS — I10 Essential (primary) hypertension: Secondary | ICD-10-CM | POA: Diagnosis not present

## 2023-08-07 DIAGNOSIS — Z1339 Encounter for screening examination for other mental health and behavioral disorders: Secondary | ICD-10-CM | POA: Diagnosis not present

## 2023-08-07 DIAGNOSIS — M25561 Pain in right knee: Secondary | ICD-10-CM | POA: Diagnosis not present

## 2023-08-07 DIAGNOSIS — Z1331 Encounter for screening for depression: Secondary | ICD-10-CM | POA: Diagnosis not present

## 2023-08-07 DIAGNOSIS — I129 Hypertensive chronic kidney disease with stage 1 through stage 4 chronic kidney disease, or unspecified chronic kidney disease: Secondary | ICD-10-CM | POA: Diagnosis not present

## 2023-08-07 DIAGNOSIS — M25512 Pain in left shoulder: Secondary | ICD-10-CM | POA: Diagnosis not present

## 2023-08-07 DIAGNOSIS — R82998 Other abnormal findings in urine: Secondary | ICD-10-CM | POA: Diagnosis not present

## 2023-08-07 DIAGNOSIS — Z853 Personal history of malignant neoplasm of breast: Secondary | ICD-10-CM | POA: Diagnosis not present

## 2023-08-07 DIAGNOSIS — N1831 Chronic kidney disease, stage 3a: Secondary | ICD-10-CM | POA: Diagnosis not present

## 2023-08-07 DIAGNOSIS — E785 Hyperlipidemia, unspecified: Secondary | ICD-10-CM | POA: Diagnosis not present

## 2023-08-07 DIAGNOSIS — M5442 Lumbago with sciatica, left side: Secondary | ICD-10-CM | POA: Diagnosis not present

## 2023-08-07 DIAGNOSIS — Z0000 Encounter for general adult medical examination without abnormal findings: Secondary | ICD-10-CM | POA: Diagnosis not present

## 2023-09-10 DIAGNOSIS — M1711 Unilateral primary osteoarthritis, right knee: Secondary | ICD-10-CM | POA: Diagnosis not present

## 2023-09-10 DIAGNOSIS — M25561 Pain in right knee: Secondary | ICD-10-CM | POA: Diagnosis not present

## 2023-09-16 DIAGNOSIS — M1711 Unilateral primary osteoarthritis, right knee: Secondary | ICD-10-CM | POA: Diagnosis not present

## 2023-09-22 DIAGNOSIS — M1711 Unilateral primary osteoarthritis, right knee: Secondary | ICD-10-CM | POA: Diagnosis not present

## 2023-10-23 DIAGNOSIS — M25512 Pain in left shoulder: Secondary | ICD-10-CM | POA: Diagnosis not present

## 2023-10-27 ENCOUNTER — Other Ambulatory Visit: Payer: Self-pay | Admitting: Orthopaedic Surgery

## 2023-10-27 DIAGNOSIS — G8929 Other chronic pain: Secondary | ICD-10-CM

## 2023-10-28 ENCOUNTER — Encounter: Payer: Self-pay | Admitting: *Deleted

## 2023-10-28 NOTE — Progress Notes (Signed)
 Received Preoperative Risk Assessment for left reverse total shoulder arthroplasty.  Per MD okay to proceed with surgery with instructions to hold Anastrozole  1 weeks prior to surgery and resume 1 week post surgery.  RN successfully faxed preoperative assessment to [phone].

## 2023-10-29 ENCOUNTER — Inpatient Hospital Stay
Admission: RE | Admit: 2023-10-29 | Discharge: 2023-10-29 | Discharge status: Home / Self Care | Source: Ambulatory Visit | Attending: Orthopaedic Surgery

## 2023-10-29 DIAGNOSIS — M25512 Pain in left shoulder: Secondary | ICD-10-CM | POA: Diagnosis not present

## 2023-10-29 DIAGNOSIS — M19012 Primary osteoarthritis, left shoulder: Secondary | ICD-10-CM | POA: Diagnosis not present

## 2023-10-29 DIAGNOSIS — G8929 Other chronic pain: Secondary | ICD-10-CM

## 2023-11-04 DIAGNOSIS — M1711 Unilateral primary osteoarthritis, right knee: Secondary | ICD-10-CM | POA: Diagnosis not present

## 2023-12-01 DIAGNOSIS — N1831 Chronic kidney disease, stage 3a: Secondary | ICD-10-CM | POA: Diagnosis not present

## 2023-12-01 DIAGNOSIS — I129 Hypertensive chronic kidney disease with stage 1 through stage 4 chronic kidney disease, or unspecified chronic kidney disease: Secondary | ICD-10-CM | POA: Diagnosis not present

## 2023-12-01 DIAGNOSIS — E785 Hyperlipidemia, unspecified: Secondary | ICD-10-CM | POA: Diagnosis not present

## 2023-12-01 DIAGNOSIS — Z01818 Encounter for other preprocedural examination: Secondary | ICD-10-CM | POA: Diagnosis not present

## 2023-12-01 DIAGNOSIS — M25512 Pain in left shoulder: Secondary | ICD-10-CM | POA: Diagnosis not present

## 2023-12-09 DIAGNOSIS — M25512 Pain in left shoulder: Secondary | ICD-10-CM | POA: Diagnosis not present

## 2023-12-10 DIAGNOSIS — H04123 Dry eye syndrome of bilateral lacrimal glands: Secondary | ICD-10-CM | POA: Diagnosis not present

## 2023-12-10 DIAGNOSIS — H18523 Epithelial (juvenile) corneal dystrophy, bilateral: Secondary | ICD-10-CM | POA: Diagnosis not present

## 2023-12-10 DIAGNOSIS — Z961 Presence of intraocular lens: Secondary | ICD-10-CM | POA: Diagnosis not present

## 2023-12-31 ENCOUNTER — Ambulatory Visit
Admission: RE | Admit: 2023-12-31 | Discharge: 2023-12-31 | Disposition: A | Discharge status: Home / Self Care | Source: Ambulatory Visit | Attending: Adult Health | Admitting: Adult Health

## 2023-12-31 DIAGNOSIS — C50212 Malignant neoplasm of upper-inner quadrant of left female breast: Secondary | ICD-10-CM

## 2023-12-31 DIAGNOSIS — R928 Other abnormal and inconclusive findings on diagnostic imaging of breast: Secondary | ICD-10-CM | POA: Diagnosis not present

## 2023-12-31 DIAGNOSIS — Z170 Estrogen receptor positive status [ER+]: Secondary | ICD-10-CM

## 2024-01-07 DIAGNOSIS — G8918 Other acute postprocedural pain: Secondary | ICD-10-CM | POA: Diagnosis not present

## 2024-01-07 DIAGNOSIS — M19012 Primary osteoarthritis, left shoulder: Secondary | ICD-10-CM | POA: Diagnosis not present

## 2024-01-13 DIAGNOSIS — M19012 Primary osteoarthritis, left shoulder: Secondary | ICD-10-CM | POA: Diagnosis not present

## 2024-01-14 ENCOUNTER — Ambulatory Visit: Admitting: Hematology and Oncology

## 2024-01-15 ENCOUNTER — Ambulatory Visit: Admitting: Hematology and Oncology

## 2024-02-03 DIAGNOSIS — Z96612 Presence of left artificial shoulder joint: Secondary | ICD-10-CM | POA: Diagnosis not present

## 2024-02-05 DIAGNOSIS — M19012 Primary osteoarthritis, left shoulder: Secondary | ICD-10-CM | POA: Diagnosis not present

## 2024-02-05 DIAGNOSIS — M6281 Muscle weakness (generalized): Secondary | ICD-10-CM | POA: Diagnosis not present

## 2024-02-05 DIAGNOSIS — M25612 Stiffness of left shoulder, not elsewhere classified: Secondary | ICD-10-CM | POA: Diagnosis not present

## 2024-02-07 DIAGNOSIS — Z23 Encounter for immunization: Secondary | ICD-10-CM | POA: Diagnosis not present

## 2024-02-16 ENCOUNTER — Inpatient Hospital Stay: Attending: Hematology and Oncology | Admitting: Hematology and Oncology

## 2024-02-16 VITALS — BP 172/53 | HR 57 | Temp 98.4°F | Resp 18 | Ht 61.0 in | Wt 158.9 lb

## 2024-02-16 DIAGNOSIS — Z79811 Long term (current) use of aromatase inhibitors: Secondary | ICD-10-CM | POA: Insufficient documentation

## 2024-02-16 DIAGNOSIS — C50212 Malignant neoplasm of upper-inner quadrant of left female breast: Secondary | ICD-10-CM | POA: Insufficient documentation

## 2024-02-16 DIAGNOSIS — Z170 Estrogen receptor positive status [ER+]: Secondary | ICD-10-CM | POA: Diagnosis not present

## 2024-02-16 NOTE — Assessment & Plan Note (Signed)
 12/31/2022: Screening mammogram detected adjacent right breast masses (12 o'clock position) 1.1 cm, axilla negative, biopsy: Grade 2 IDC ER 100%, PR 30%, HER2 1+ negative, Ki-67 20% (Left breast cancer 2014: Treated with surgery, radiation)   Treatment plan: 01/23/2023: Rt Lumpectomy: Grade 2 IDC 1.6 cm with focal DCIS intermediate grade, margins negative, negative for lymphovascular invasion, ER 100%, PR 30%, Ki-67 20%, HER2 negative 1+ Radiation: 03/11/2023-03/31/2023 Antiestrogen therapy with anastrozole  1 mg daily x 5 years to start 04/23/2023   Anastrozole  toxicities: Tolerating it extremely well without any problems or concerns  Breast cancer surveillance: Breast exam 02/16/2024: Benign Mammogram 12/31/2023: Benign breast density category C Bone density has been ordered for January 2026.  Return to clinic in 1 year for follow-up

## 2024-02-16 NOTE — Progress Notes (Unsigned)
 Patient Care Team: Yolande Toribio MATSU, MD as PCP - General (Internal Medicine) Odean Potts, MD as Consulting Physician (Hematology and Oncology) Izell Domino, MD as Attending Physician (Radiation Oncology) Vernetta Berg, MD as Consulting Physician (General Surgery)  DIAGNOSIS:  Encounter Diagnosis  Name Primary?   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  Report date is 01/20/2023.   The CancerNext-Expanded gene panel offered by [facility] and includes sequencing, rearrangement, and RNA analysis for the following 71 genes:  AIP, ALK, APC, ATM, BAP1, BARD1, BMPR1A, BRCA1, BRCA2, BRIP1, CDC73, CDH1, CDK4, CDKN1B, CDKN2A, CHEK2, DICER1, FH, FLCN, KIF1B, LZTR1,MAX, MEN1, MET, MLH1, MSH2, MSH6, MUTYH, NF1, NF2, NTHL1, PALB2, PHOX2B, PMS2, POT1, PRKAR1A, PTCH1, PTEN, RAD51C,RAD51D, RB1, RET, SDHA, SDHAF2, SDHB, SDHC, SDHD, SMAD4, SMARCA4, SMARCB1, SMARCE1, STK11, SUFU, TMEM127, TP53,TSC1, TSC2 and VHL (sequencing and deletion/duplication); AXIN2, CTNNA1, EGFR, EGLN1, HOXB13, KIT, MITF, MSH3, PDGFRA, POLD1 and POLE (sequencing only); EPCAM and GREM1 (deletion/duplication only).    03/11/2023 - 03/31/2023 Radiation Therapy   Adjuvant radiation   04/23/2023 -  Anti-estrogen oral therapy   Adjuvant anastrozole  x 5 years   Malignant neoplasm of upper-outer quadrant of right breast in female, estrogen receptor positive (HCC)   12/31/2022 Initial Diagnosis   Screening mammogram detected adjacent right breast masses (12 o'clock position) 1.1 cm, axilla negative, biopsy: Grade 2 IDC ER 100%, PR 30%, HER2 1+ negative, Ki-67 20% (Left breast cancer 2014: Treated with surgery, radiation)   01/08/2023 Cancer Staging   Staging form: Breast, AJCC 8th Edition - Clinical: Stage IA (cT1c, cN0, cM0, G2, ER+, PR+, HER2-) - Signed by Odean Potts, MD on 01/08/2023 Histologic grading system: 3 grade system   01/20/2023 Genetic Testing   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  Report date is 01/20/2023.   The CancerNext-Expanded gene panel offered by [facility] and includes sequencing, rearrangement, and RNA analysis for the following 71 genes:  AIP, ALK, APC, ATM, BAP1, BARD1, BMPR1A, BRCA1, BRCA2, BRIP1, CDC73, CDH1, CDK4, CDKN1B, CDKN2A, CHEK2, DICER1, FH, FLCN, KIF1B, LZTR1,MAX, MEN1, MET, MLH1, MSH2, MSH6, MUTYH, NF1, NF2, NTHL1, PALB2, PHOX2B, PMS2, POT1, PRKAR1A, PTCH1, PTEN, RAD51C,RAD51D, RB1, RET, SDHA, SDHAF2, SDHB, SDHC, SDHD, SMAD4, SMARCA4, SMARCB1, SMARCE1, STK11, SUFU, TMEM127, TP53,TSC1, TSC2 and VHL (sequencing and deletion/duplication); AXIN2, CTNNA1, EGFR, EGLN1, HOXB13, KIT, MITF, MSH3, PDGFRA, POLD1 and POLE (sequencing only); EPCAM and GREM1 (deletion/duplication only).    03/10/2023 - 03/31/2023 Radiation Therapy   Plan Name: Breast_R Site: Breast, Right Technique: 3D Mode: Photon Dose Per Fraction: 2.67 Gy Prescribed Dose (Delivered / Prescribed): 40.05 Gy / 40.05 Gy Prescribed Fxs (Delivered / Prescribed): 15 / 15   04/2023 -  Anti-estrogen oral therapy   1 mg Anastrozole  daily x 5 years     CHIEF COMPLIANT: Follow-up on anastrozole  therapy  HISTORY OF PRESENT ILLNESS:   History of Present Illness Teresa Chapman is an 83 year old female who presents for follow-up after left shoulder replacement surgery.  She underwent left shoulder  replacement surgery last month  and has since developed bruising across her chest, possibly related to a post-operative splint. She is not on anticoagulants but takes a daily baby aspirin . She has been on anastrozole  for almost a year, experiencing occasional hot flashes without significant joint stiffness. A bone density test is scheduled for January 5th.     ALLERGIES:  is allergic to augmentin [amoxicillin-pot clavulanate] and codeine.  MEDICATIONS:  Current Outpatient Medications  Medication Sig Dispense Refill   acetaminophen  (TYLENOL ) 500 MG tablet Take 1,000 mg by mouth at bedtime.     anastrozole  (ARIMIDEX ) 1 MG tablet Take 1 tablet (1 mg total) by mouth daily. 90 tablet 3   aspirin  EC 81 MG tablet Take 81 mg by mouth daily.     atorvastatin  (LIPITOR) 10 MG tablet Take 10 mg by mouth daily.       Calcium  Carbonate-Vitamin D  (CALTRATE 600+D PO) Take 1 tablet by mouth daily.     carvedilol (COREG) 25 MG tablet Take 25 mg by mouth 2 (two) times daily with a meal.     Cholecalciferol (VITAMIN D3) 10 MCG (400 UNIT) CAPS Take 400 Units by mouth daily.     cycloSPORINE  (RESTASIS ) 0.05 % ophthalmic emulsion Place 1 drop into both eyes 2 (two) times daily.     gabapentin  (NEURONTIN ) 300 MG capsule Take 300 mg by mouth at bedtime.     losartan  (COZAAR ) 50 MG tablet Take 50 mg by mouth daily.     Multiple Vitamin (MULTIVITAMIN WITH MINERALS) TABS tablet Take 1 tablet by mouth daily.     NONFORMULARY OR COMPOUNDED ITEM Apply 1 Application topically in the morning and at bedtime. Ketoprofen 10%, Lidocaine  5% , Gabapentin  4%, Cyclobenzaprine 2%     Polyethyl Glyc-Propyl Glyc PF (SYSTANE PRESERVATIVE FREE) 0.4-0.3 % SOLN Place 1 drop into both eyes 3 (three) times daily as needed (dry eyes).     potassium chloride  SA (K-DUR,KLOR-CON ) 20 MEQ tablet Take 20 mEq by mouth 2 (two) times daily.     sodium chloride  (MURO 128) 5 % ophthalmic ointment Place 1 Application into both eyes every other day.     triamterene -hydrochlorothiazide   (MAXZIDE ) 75-50 MG per tablet Take 0.5 tablets by mouth daily.      venlafaxine  XR (EFFEXOR -XR) 37.5 MG 24 hr capsule TAKE 1 CAPSULE BY MOUTH TWICE A DAY 180 capsule 3   verapamil  (VERELAN  PM) 180 MG 24 hr capsule Take 180 mg by mouth every morning.      No current facility-administered medications for this visit.   Facility-Administered Medications Ordered in Other Visits  Medication Dose Route Frequency Provider Last Rate Last Admin   topical emolient (BIAFINE) emulsion   Topical PRN Keenan Hastings, MD   Given at 02/03/13 0927    PHYSICAL EXAMINATION: ECOG PERFORMANCE STATUS: 1 - Symptomatic but completely ambulatory  Vitals:   02/16/24 1512  BP: (!) 172/53  Pulse: (!) 57  Resp: 18  Temp: 98.4 F (36.9 C)  SpO2: 99%   Filed Weights   02/16/24 1512  Weight: 158 lb 14.4 oz (72.1 kg)    Physical Exam MUSCULOSKELETAL: Left shoulder replacement surgery site examined. SKIN: Bruise present on left shoulder.  (exam performed in the presence of a chaperone)  LABORATORY DATA:  I have reviewed the data as listed    Latest Ref Rng & Units 01/08/2023   12:23 PM 03/10/2018    4:45 AM 03/04/2018   12:26 PM  CMP  Glucose 70 - 99 mg/dL 97  117  79   BUN 8 - 23 mg/dL 22  17  21    Creatinine 0.44 - 1.00 mg/dL 8.82  9.22  8.95   Sodium 135 - 145 mmol/L 142  139  140   Potassium 3.5 - 5.1 mmol/L 4.0  3.1  4.3   Chloride 98 - 111 mmol/L 106  103  102   CO2 22 - 32 mmol/L 30  28  31    Calcium  8.9 - 10.3 mg/dL 9.8  8.2  9.6   Total Protein 6.5 - 8.1 g/dL 6.9   7.0   Total Bilirubin 0.3 - 1.2 mg/dL 0.6   0.8   Alkaline Phos 38 - 126 U/L 78   89   AST 15 - 41 U/L 17   23   ALT 0 - 44 U/L 14   19     Lab Results  Component Value Date   WBC 5.1 01/08/2023   HGB 12.6 01/08/2023   HCT 38.1 01/08/2023   MCV 95.7 01/08/2023   PLT 170 01/08/2023   NEUTROABS 3.3 01/08/2023    ASSESSMENT & PLAN:  Malignant neoplasm of upper-inner quadrant of left breast in female, estrogen receptor  positive (HCC) 12/31/2022: Screening mammogram detected adjacent right breast masses (12 o'clock position) 1.1 cm, axilla negative, biopsy: Grade 2 IDC ER 100%, PR 30%, HER2 1+ negative, Ki-67 20% (Left breast cancer 2014: Treated with surgery, radiation)   Treatment plan: 01/23/2023: Rt Lumpectomy: Grade 2 IDC 1.6 cm with focal DCIS intermediate grade, margins negative, negative for lymphovascular invasion, ER 100%, PR 30%, Ki-67 20%, HER2 negative 1+ Radiation: 03/11/2023-03/31/2023 Antiestrogen therapy with anastrozole  1 mg daily x 5 years started 04/23/2023   Anastrozole  toxicities: Tolerating it extremely well without any problems or concerns  Breast cancer surveillance: Breast exam 02/16/2024: Benign, bruise on the left breast Mammogram 12/31/2023: Benign breast density category C Bone density has been ordered for January 2026.  Left shoulder replacement surgery October 2025 Return to clinic in 1 year for follow-up  Assessment & Plan Bone health monitoring on aromatase inhibitor therapy Bone density testing scheduled to prevent osteoporosis. - Continue calcium  carbonate-vitamin D  supplementation. - Ensure bone density test is completed on January 5th.      No orders of the defined types were placed in this encounter.  The patient has a good understanding of the overall plan. she agrees with it. she will call with any problems that may develop before the next visit here.  I personally spent a total of 30 minutes in the care of the patient today including preparing to see the patient, getting/reviewing separately obtained history, performing a medically appropriate exam/evaluation, counseling and educating, placing orders, referring and communicating with other health care professionals, documenting clinical information in the EHR, independently interpreting results, communicating results, and coordinating care.   Viinay K Harol Shabazz, MD 02/16/24

## 2024-02-18 DIAGNOSIS — M19012 Primary osteoarthritis, left shoulder: Secondary | ICD-10-CM | POA: Diagnosis not present

## 2024-02-18 DIAGNOSIS — M25612 Stiffness of left shoulder, not elsewhere classified: Secondary | ICD-10-CM | POA: Diagnosis not present

## 2024-02-18 DIAGNOSIS — M6281 Muscle weakness (generalized): Secondary | ICD-10-CM | POA: Diagnosis not present

## 2024-02-25 DIAGNOSIS — M6281 Muscle weakness (generalized): Secondary | ICD-10-CM | POA: Diagnosis not present

## 2024-02-25 DIAGNOSIS — M25612 Stiffness of left shoulder, not elsewhere classified: Secondary | ICD-10-CM | POA: Diagnosis not present

## 2024-02-25 DIAGNOSIS — M19012 Primary osteoarthritis, left shoulder: Secondary | ICD-10-CM | POA: Diagnosis not present

## 2024-03-02 DIAGNOSIS — D2262 Melanocytic nevi of left upper limb, including shoulder: Secondary | ICD-10-CM | POA: Diagnosis not present

## 2024-03-02 DIAGNOSIS — L821 Other seborrheic keratosis: Secondary | ICD-10-CM | POA: Diagnosis not present

## 2024-03-02 DIAGNOSIS — L718 Other rosacea: Secondary | ICD-10-CM | POA: Diagnosis not present

## 2024-03-02 DIAGNOSIS — D2371 Other benign neoplasm of skin of right lower limb, including hip: Secondary | ICD-10-CM | POA: Diagnosis not present

## 2024-03-02 DIAGNOSIS — D2261 Melanocytic nevi of right upper limb, including shoulder: Secondary | ICD-10-CM | POA: Diagnosis not present

## 2024-03-02 DIAGNOSIS — D225 Melanocytic nevi of trunk: Secondary | ICD-10-CM | POA: Diagnosis not present

## 2024-03-02 DIAGNOSIS — Z85828 Personal history of other malignant neoplasm of skin: Secondary | ICD-10-CM | POA: Diagnosis not present

## 2024-03-02 DIAGNOSIS — L570 Actinic keratosis: Secondary | ICD-10-CM | POA: Diagnosis not present

## 2024-03-03 DIAGNOSIS — M19012 Primary osteoarthritis, left shoulder: Secondary | ICD-10-CM | POA: Diagnosis not present

## 2024-03-03 DIAGNOSIS — M25612 Stiffness of left shoulder, not elsewhere classified: Secondary | ICD-10-CM | POA: Diagnosis not present

## 2024-03-03 DIAGNOSIS — M6281 Muscle weakness (generalized): Secondary | ICD-10-CM | POA: Diagnosis not present

## 2024-03-09 DIAGNOSIS — Z23 Encounter for immunization: Secondary | ICD-10-CM | POA: Diagnosis not present

## 2024-03-10 DIAGNOSIS — M6281 Muscle weakness (generalized): Secondary | ICD-10-CM | POA: Diagnosis not present

## 2024-03-10 DIAGNOSIS — M19012 Primary osteoarthritis, left shoulder: Secondary | ICD-10-CM | POA: Diagnosis not present

## 2024-03-10 DIAGNOSIS — M25612 Stiffness of left shoulder, not elsewhere classified: Secondary | ICD-10-CM | POA: Diagnosis not present

## 2024-03-12 ENCOUNTER — Other Ambulatory Visit: Payer: Self-pay | Admitting: Hematology and Oncology

## 2024-03-16 ENCOUNTER — Other Ambulatory Visit

## 2024-03-17 DIAGNOSIS — M25612 Stiffness of left shoulder, not elsewhere classified: Secondary | ICD-10-CM | POA: Diagnosis not present

## 2024-03-17 DIAGNOSIS — M6281 Muscle weakness (generalized): Secondary | ICD-10-CM | POA: Diagnosis not present

## 2024-03-17 DIAGNOSIS — M19012 Primary osteoarthritis, left shoulder: Secondary | ICD-10-CM | POA: Diagnosis not present

## 2024-03-31 DIAGNOSIS — M25612 Stiffness of left shoulder, not elsewhere classified: Secondary | ICD-10-CM | POA: Diagnosis not present

## 2024-03-31 DIAGNOSIS — M6281 Muscle weakness (generalized): Secondary | ICD-10-CM | POA: Diagnosis not present

## 2024-03-31 DIAGNOSIS — M19012 Primary osteoarthritis, left shoulder: Secondary | ICD-10-CM | POA: Diagnosis not present

## 2024-04-05 DIAGNOSIS — M1711 Unilateral primary osteoarthritis, right knee: Secondary | ICD-10-CM | POA: Diagnosis not present

## 2024-04-06 DIAGNOSIS — Z96612 Presence of left artificial shoulder joint: Secondary | ICD-10-CM | POA: Diagnosis not present

## 2024-04-07 DIAGNOSIS — M6281 Muscle weakness (generalized): Secondary | ICD-10-CM | POA: Diagnosis not present

## 2024-04-07 DIAGNOSIS — M25612 Stiffness of left shoulder, not elsewhere classified: Secondary | ICD-10-CM | POA: Diagnosis not present

## 2024-04-07 DIAGNOSIS — M19012 Primary osteoarthritis, left shoulder: Secondary | ICD-10-CM | POA: Diagnosis not present

## 2024-04-26 ENCOUNTER — Ambulatory Visit (HOSPITAL_BASED_OUTPATIENT_CLINIC_OR_DEPARTMENT_OTHER)
Admission: RE | Admit: 2024-04-26 | Discharge: 2024-04-26 | Disposition: A | Discharge status: Home / Self Care | Source: Ambulatory Visit | Attending: Adult Health | Admitting: Adult Health

## 2024-04-26 DIAGNOSIS — Z79811 Long term (current) use of aromatase inhibitors: Secondary | ICD-10-CM | POA: Insufficient documentation

## 2024-04-26 DIAGNOSIS — Z13820 Encounter for screening for osteoporosis: Secondary | ICD-10-CM | POA: Diagnosis present

## 2024-04-29 ENCOUNTER — Ambulatory Visit: Payer: Self-pay

## 2024-04-29 NOTE — Telephone Encounter (Signed)
-----   Message from Morna Kendall, NP sent at 04/29/2024 10:54 AM EST ----- Please let patient know that bone density shows osteopenia that is slightly improved in the left hip, otherwise stable from 2018.  This is good news :)  Would repeat in 2 years.   Thanks, LC

## 2025-02-15 ENCOUNTER — Inpatient Hospital Stay: Admitting: Hematology and Oncology
